# Patient Record
Sex: Female | Born: 1965 | Race: White | Hispanic: No | Marital: Married | Smoking: Current every day smoker
Health system: Southern US, Community
[De-identification: ages and names within clinical notes are randomized; demographics above are authoritative.]

## PROBLEM LIST (undated history)

## (undated) ENCOUNTER — Ambulatory Visit: Admission: EM | Discharge status: Against Medical Advice | Payer: PRIVATE HEALTH INSURANCE

## (undated) DIAGNOSIS — K859 Acute pancreatitis without necrosis or infection, unspecified: Secondary | ICD-10-CM

## (undated) DIAGNOSIS — E785 Hyperlipidemia, unspecified: Secondary | ICD-10-CM

## (undated) DIAGNOSIS — R12 Heartburn: Secondary | ICD-10-CM

## (undated) DIAGNOSIS — E119 Type 2 diabetes mellitus without complications: Secondary | ICD-10-CM

## (undated) DIAGNOSIS — I10 Essential (primary) hypertension: Secondary | ICD-10-CM

## (undated) HISTORY — DX: Heartburn: R12

## (undated) HISTORY — PX: APPENDECTOMY: SHX54

## (undated) HISTORY — PX: FOOT SURGERY: SHX648

## (undated) HISTORY — PX: ABDOMINAL HYSTERECTOMY: SHX81

## (undated) HISTORY — PX: CARDIAC CATHETERIZATION: SHX172

## (undated) HISTORY — PX: CHOLECYSTECTOMY: SHX55

## (undated) HISTORY — DX: Hyperlipidemia, unspecified: E78.5

---

## 1996-02-25 HISTORY — PX: CARPAL TUNNEL RELEASE: SHX101

## 2014-05-29 ENCOUNTER — Emergency Department
Admit: 2014-05-29 | Disposition: A | Discharge status: Home / Self Care | Payer: Self-pay | Admitting: Emergency Medicine

## 2014-05-29 LAB — CBC WITH DIFFERENTIAL/PLATELET
BASOS PCT: 0.3 %
Basophil #: 0 10*3/uL (ref 0.0–0.1)
Eosinophil #: 0.1 10*3/uL (ref 0.0–0.7)
Eosinophil %: 0.6 %
HCT: 48.1 % — AB (ref 35.0–47.0)
HGB: 16.3 g/dL — AB (ref 12.0–16.0)
LYMPHS PCT: 29.4 %
Lymphocyte #: 3 10*3/uL (ref 1.0–3.6)
MCH: 29.8 pg (ref 26.0–34.0)
MCHC: 33.8 g/dL (ref 32.0–36.0)
MCV: 88 fL (ref 80–100)
MONOS PCT: 5.4 %
Monocyte #: 0.5 x10 3/mm (ref 0.2–0.9)
NEUTROS ABS: 6.5 10*3/uL (ref 1.4–6.5)
Neutrophil %: 64.3 %
Platelet: 250 10*3/uL (ref 150–440)
RBC: 5.47 10*6/uL — ABNORMAL HIGH (ref 3.80–5.20)
RDW: 13.4 % (ref 11.5–14.5)
WBC: 10.2 10*3/uL (ref 3.6–11.0)

## 2014-05-29 LAB — COMPREHENSIVE METABOLIC PANEL
ALBUMIN: 4.4 g/dL
Alkaline Phosphatase: 84 U/L
Anion Gap: 9 (ref 7–16)
BUN: 12 mg/dL
Bilirubin,Total: 0.9 mg/dL
Calcium, Total: 9.2 mg/dL
Chloride: 99 mmol/L — ABNORMAL LOW
Co2: 23 mmol/L
Creatinine: 0.47 mg/dL
EGFR (African American): >60
EGFR (Non-African Amer.): >60
Glucose: 353 mg/dL — ABNORMAL HIGH
Potassium: 3.9 mmol/L
SGOT(AST): 18 U/L
SGPT (ALT): 18 U/L
SODIUM: 131 mmol/L — AB
TOTAL PROTEIN: 7.8 g/dL

## 2014-05-29 LAB — LIPASE, BLOOD: Lipase: 32 U/L

## 2014-06-23 ENCOUNTER — Emergency Department
Admit: 2014-06-23 | Disposition: A | Discharge status: Home / Self Care | Payer: Self-pay | Admitting: Emergency Medicine

## 2014-06-23 LAB — LIPASE, BLOOD: Lipase: 74 U/L — ABNORMAL HIGH

## 2014-06-23 LAB — CBC WITH DIFFERENTIAL/PLATELET
Basophil #: 0 10*3/uL (ref 0.0–0.1)
Basophil %: 0.4 %
EOS PCT: 1.3 %
Eosinophil #: 0.1 10*3/uL (ref 0.0–0.7)
HCT: 47 % (ref 35.0–47.0)
HGB: 16.3 g/dL — ABNORMAL HIGH (ref 12.0–16.0)
LYMPHS ABS: 4.5 10*3/uL — AB (ref 1.0–3.6)
Lymphocyte %: 40.3 %
MCH: 30.6 pg (ref 26.0–34.0)
MCHC: 34.6 g/dL (ref 32.0–36.0)
MCV: 89 fL (ref 80–100)
Monocyte #: 0.6 x10 3/mm (ref 0.2–0.9)
Monocyte %: 5.8 %
NEUTROS PCT: 52.2 %
Neutrophil #: 5.8 10*3/uL (ref 1.4–6.5)
PLATELETS: 239 10*3/uL (ref 150–440)
RBC: 5.31 10*6/uL — AB (ref 3.80–5.20)
RDW: 12.9 % (ref 11.5–14.5)
WBC: 11 10*3/uL (ref 3.6–11.0)

## 2014-06-23 LAB — COMPREHENSIVE METABOLIC PANEL
ALK PHOS: 81 U/L
Albumin: 4.1 g/dL
Anion Gap: 9 (ref 7–16)
BUN: 14 mg/dL
Bilirubin,Total: 0.6 mg/dL
CALCIUM: 8.8 mg/dL — AB
CHLORIDE: 98 mmol/L — AB
CREATININE: 0.6 mg/dL
Co2: 24 mmol/L
Glucose: 413 mg/dL — ABNORMAL HIGH
POTASSIUM: 4.3 mmol/L
SGOT(AST): 15 U/L
SGPT (ALT): 17 U/L
Sodium: 131 mmol/L — ABNORMAL LOW
Total Protein: 7.5 g/dL

## 2014-06-23 LAB — TROPONIN I

## 2014-06-26 ENCOUNTER — Emergency Department: Admission: EM | Admit: 2014-06-26 | Discharge: 2014-06-26 | Discharge status: Absent without leave | Payer: Self-pay

## 2014-09-19 ENCOUNTER — Encounter: Payer: Self-pay | Admitting: Student

## 2014-09-19 ENCOUNTER — Emergency Department: Payer: PRIVATE HEALTH INSURANCE

## 2014-09-19 ENCOUNTER — Emergency Department
Admission: EM | Admit: 2014-09-19 | Discharge: 2014-09-19 | Disposition: A | Discharge status: Home / Self Care | Payer: PRIVATE HEALTH INSURANCE | Attending: Emergency Medicine | Admitting: Emergency Medicine

## 2014-09-19 ENCOUNTER — Other Ambulatory Visit: Payer: Self-pay

## 2014-09-19 DIAGNOSIS — E1043 Type 1 diabetes mellitus with diabetic autonomic (poly)neuropathy: Secondary | ICD-10-CM | POA: Diagnosis not present

## 2014-09-19 DIAGNOSIS — I10 Essential (primary) hypertension: Secondary | ICD-10-CM | POA: Diagnosis not present

## 2014-09-19 DIAGNOSIS — Z91040 Latex allergy status: Secondary | ICD-10-CM | POA: Diagnosis not present

## 2014-09-19 DIAGNOSIS — K3184 Gastroparesis: Secondary | ICD-10-CM

## 2014-09-19 DIAGNOSIS — R079 Chest pain, unspecified: Secondary | ICD-10-CM | POA: Diagnosis present

## 2014-09-19 DIAGNOSIS — Z720 Tobacco use: Secondary | ICD-10-CM | POA: Diagnosis not present

## 2014-09-19 DIAGNOSIS — E1143 Type 2 diabetes mellitus with diabetic autonomic (poly)neuropathy: Secondary | ICD-10-CM

## 2014-09-19 DIAGNOSIS — R109 Unspecified abdominal pain: Secondary | ICD-10-CM

## 2014-09-19 HISTORY — DX: Acute pancreatitis without necrosis or infection, unspecified: K85.90

## 2014-09-19 HISTORY — DX: Type 2 diabetes mellitus without complications: E11.9

## 2014-09-19 HISTORY — DX: Essential (primary) hypertension: I10

## 2014-09-19 LAB — CBC
HEMATOCRIT: 45.4 % (ref 35.0–47.0)
Hemoglobin: 15.8 g/dL (ref 12.0–16.0)
MCH: 30.8 pg (ref 26.0–34.0)
MCHC: 34.9 g/dL (ref 32.0–36.0)
MCV: 88.2 fL (ref 80.0–100.0)
Platelets: 243 10*3/uL (ref 150–440)
RBC: 5.15 MIL/uL (ref 3.80–5.20)
RDW: 13.2 % (ref 11.5–14.5)
WBC: 9 10*3/uL (ref 3.6–11.0)

## 2014-09-19 LAB — URINALYSIS COMPLETE WITH MICROSCOPIC (ARMC ONLY)
BACTERIA UA: NONE SEEN
Bilirubin Urine: NEGATIVE
Ketones, ur: NEGATIVE mg/dL
Leukocytes, UA: NEGATIVE
Nitrite: NEGATIVE
Protein, ur: NEGATIVE mg/dL
Specific Gravity, Urine: 1.023 (ref 1.005–1.030)
Squamous Epithelial / LPF: NONE SEEN
pH: 7 (ref 5.0–8.0)

## 2014-09-19 LAB — COMPREHENSIVE METABOLIC PANEL
ALBUMIN: 4.1 g/dL (ref 3.5–5.0)
ALT: 13 U/L — AB (ref 14–54)
AST: 18 U/L (ref 15–41)
Alkaline Phosphatase: 93 U/L (ref 38–126)
Anion gap: 10 (ref 5–15)
BUN: 12 mg/dL (ref 6–20)
CO2: 23 mmol/L (ref 22–32)
CREATININE: 0.6 mg/dL (ref 0.44–1.00)
Calcium: 8.8 mg/dL — ABNORMAL LOW (ref 8.9–10.3)
Chloride: 96 mmol/L — ABNORMAL LOW (ref 101–111)
Glucose, Bld: 389 mg/dL — ABNORMAL HIGH (ref 65–99)
Potassium: 4 mmol/L (ref 3.5–5.1)
Sodium: 129 mmol/L — ABNORMAL LOW (ref 135–145)
Total Bilirubin: 0.5 mg/dL (ref 0.3–1.2)
Total Protein: 7.3 g/dL (ref 6.5–8.1)

## 2014-09-19 LAB — LIPASE, BLOOD: LIPASE: 23 U/L (ref 22–51)

## 2014-09-19 LAB — GLUCOSE, CAPILLARY: Glucose-Capillary: 312 mg/dL — ABNORMAL HIGH (ref 65–99)

## 2014-09-19 MED ORDER — METOCLOPRAMIDE HCL 5 MG/ML IJ SOLN
INTRAMUSCULAR | Status: AC
  Administered 2014-09-19: 10 mg via INTRAVENOUS
  Filled 2014-09-19: qty 2

## 2014-09-19 MED ORDER — METOCLOPRAMIDE HCL 5 MG/ML IJ SOLN
10.0000 mg | Freq: Once | INTRAMUSCULAR | Status: AC
  Administered 2014-09-19: 10 mg via INTRAVENOUS

## 2014-09-19 MED ORDER — OXYCODONE-ACETAMINOPHEN 5-325 MG PO TABS
1.0000 | ORAL_TABLET | Freq: Four times a day (QID) | ORAL | Status: DC | PRN
Start: 2014-09-19 — End: 2014-10-20

## 2014-09-19 MED ORDER — HYDROMORPHONE HCL 1 MG/ML IJ SOLN
0.5000 mg | Freq: Once | INTRAMUSCULAR | Status: AC
Start: 2014-09-19 — End: 2014-09-19
  Administered 2014-09-19: 0.5 mg via INTRAVENOUS
  Filled 2014-09-19: qty 1

## 2014-09-19 MED ORDER — INSULIN ASPART 100 UNIT/ML ~~LOC~~ SOLN
5.0000 [IU] | Freq: Once | SUBCUTANEOUS | Status: AC
  Administered 2014-09-19: 5 [IU] via SUBCUTANEOUS

## 2014-09-19 MED ORDER — SODIUM CHLORIDE 0.9 % IV SOLN
Freq: Once | INTRAVENOUS | Status: AC
  Administered 2014-09-19: 20:00:00 via INTRAVENOUS

## 2014-09-19 MED ORDER — PROMETHAZINE HCL 25 MG/ML IJ SOLN
INTRAMUSCULAR | Status: AC
  Administered 2014-09-19: 25 mg via INTRAVENOUS
  Filled 2014-09-19: qty 1

## 2014-09-19 MED ORDER — INSULIN ASPART 100 UNIT/ML ~~LOC~~ SOLN
SUBCUTANEOUS | Status: AC
  Administered 2014-09-19: 5 [IU] via SUBCUTANEOUS
  Filled 2014-09-19: qty 5

## 2014-09-19 MED ORDER — METOCLOPRAMIDE HCL 10 MG PO TABS: 10.0000 mg | ORAL_TABLET | Freq: Three times a day (TID) | ORAL | Status: DC | PRN

## 2014-09-19 MED ORDER — HYDROMORPHONE HCL 1 MG/ML IJ SOLN
INTRAMUSCULAR | Status: AC
  Administered 2014-09-19: 1 mg via INTRAVENOUS
  Filled 2014-09-19: qty 1

## 2014-09-19 MED ORDER — PROMETHAZINE HCL 25 MG/ML IJ SOLN
25.0000 mg | Freq: Once | INTRAMUSCULAR | Status: AC
  Administered 2014-09-19: 25 mg via INTRAVENOUS

## 2014-09-19 MED ORDER — HYDROMORPHONE HCL 1 MG/ML IJ SOLN
1.0000 mg | Freq: Once | INTRAMUSCULAR | Status: AC
  Administered 2014-09-19: 1 mg via INTRAVENOUS

## 2014-09-19 NOTE — ED Provider Notes (Signed)
[facility] Emergency Department Provider Note     Time seen: ----------------------------------------- 7:41 PM on 09/19/2014 -----------------------------------------    I have reviewed the triage vital signs and the nursing notes.   HISTORY  Chief Complaint Chest Pain and Nausea    HPI Veronica Lozano is a 49 y.o. female who presents to ER with feeling bad for the last 3 or 4 days. Patient states when this happened before but has been pancreatitis. She started having pain Saturday night, red meat seems to trigger it. Patient reports nausea vomiting and inability to keep anything down since that period time. Pain is in the epigastrium, severe. Nothing makes it better or worse.   Past Medical History  Diagnosis Date  . Diabetes mellitus without complication   . Pancreatitis   . Hypertension     There are no active problems to display for this patient.   Past Surgical History  Procedure Laterality Date  . Cholecystectomy    . Appendectomy    . Abdominal hysterectomy    . Cardiac catheterization      Allergies Latex and Morphine and related  Social History History  Substance Use Topics  . Smoking status: Current Every Day Smoker -- 0.50 packs/day    Types: Cigarettes  . Smokeless tobacco: Never Used  . Alcohol Use: No    Review of Systems Constitutional: Negative for fever. Eyes: Negative for visual changes. ENT: Negative for sore throat. Cardiovascular: Negative for chest pain. Respiratory: Negative for shortness of breath. Gastrointestinal: Positive for abdominal pain and vomiting Genitourinary: Negative for dysuria. Musculoskeletal: Negative for back pain. Skin: Negative for rash. Neurological: Negative for headaches, focal weakness or numbness.  10-point ROS otherwise negative.  ____________________________________________   PHYSICAL EXAM:  VITAL SIGNS: ED Triage Vitals  Enc Vitals Group     BP 09/19/14 1829 143/81  mmHg     Pulse Rate 09/19/14 1829 117     Resp 09/19/14 1829 18     Temp 09/19/14 1829 98 F (36.7 C)     Temp Source 09/19/14 1829 Oral     SpO2 09/19/14 1829 97 %     Weight 09/19/14 1829 146 lb (66.225 kg)     Height 09/19/14 1829 4\' 11"  (1.499 m)     Head Cir --      Peak Flow --      Pain Score 09/19/14 1829 9     Pain Loc --      Pain Edu? --      Excl. in GC? --     Constitutional: Alert and oriented. Mild distress Eyes: Conjunctivae are normal. PERRL. Normal extraocular movements. ENT   Head: Normocephalic and atraumatic.   Nose: No congestion/rhinnorhea.   Mouth/Throat: Mucous membranes are moist.   Neck: No stridor. Cardiovascular: Normal rate, regular rhythm. Normal and symmetric distal pulses are present in all extremities. No murmurs, rubs, or gallops. Respiratory: Normal respiratory effort without tachypnea nor retractions. Breath sounds are clear and equal bilaterally. No wheezes/rales/rhonchi. Gastrointestinal: Tender, distended, epigastric tenderness. No rebound or guarding. Musculoskeletal: Nontender with normal range of motion in all extremities. No joint effusions.  No lower extremity tenderness nor edema. Neurologic:  Normal speech and language. No gross focal neurologic deficits are appreciated. Speech is normal. No gait instability. Skin:  Skin is warm, dry and intact. No rash noted. Psychiatric: Mood and affect are normal. Speech and behavior are normal. Patient exhibits appropriate insight and judgment. ____________________________________________  EKG: Interpreted by me. Sinus tachycardia with  a rate of 109 bpm, normal axis normal intervals. No evidence of hypertrophy or acute infarction.  ____________________________________________  ED COURSE:  Pertinent labs & imaging results that were available during my care of the patient were reviewed by me and considered in my medical decision making (see chart for details). Patient need abdominal  labs, IV fluid antiemetics and mild pain control. ____________________________________________    LABS (pertinent positives/negatives)  Labs Reviewed  COMPREHENSIVE METABOLIC PANEL - Abnormal; Notable for the following:    Sodium 129 (*)    Chloride 96 (*)    Glucose, Bld 389 (*)    Calcium 8.8 (*)    ALT 13 (*)    All other components within normal limits  URINALYSIS COMPLETEWITH MICROSCOPIC ([facility] ONLY) - Abnormal; Notable for the following:    Color, Urine STRAW (*)    APPearance CLEAR (*)    Glucose, UA >500 (*)    Hgb urine dipstick 1+ (*)    All other components within normal limits  LIPASE, BLOOD  CBC   FINDINGS: The bowel gas pattern is normal. There is no evidence of free air. No radio-opaque calculi or other significant radiographic abnormality is seen. Cholecystectomy clips are noted.  IMPRESSION: Negative exam.  ____________________________________________  FINAL ASSESSMENT AND PLAN  Abdominal pain and vomiting, likely gastroparesis  Plan: Patient with labs and imaging as dictated above. Patient reportedly with a history of chronic pancreatitis. This is likely similar. She was given fluid, antiemetics and some pain medicine IV. She needed repeated doses of Reglan and IV Phenergan.  I offered the patient patient, she declined. We'll discharge her with Reglan and pain medication. She does have follow-up scheduled as an outpatient.   Emily Filbert, MD   Emily Filbert, MD 09/19/14 [phone]

## 2014-09-19 NOTE — ED Notes (Signed)
Felt bad for last three or four days.  "Pancreas pain" started late Saturday night.  Pt reports a history of pancreatitis.  Pt reports chest pain triggers left leg to hurt.  Pt reports nausea and vomiting since last night.

## 2014-09-19 NOTE — Discharge Instructions (Signed)

## 2014-10-10 ENCOUNTER — Emergency Department: Payer: PRIVATE HEALTH INSURANCE

## 2014-10-10 ENCOUNTER — Other Ambulatory Visit: Payer: Self-pay

## 2014-10-10 ENCOUNTER — Emergency Department
Admission: EM | Admit: 2014-10-10 | Discharge: 2014-10-10 | Disposition: A | Discharge status: Home / Self Care | Payer: PRIVATE HEALTH INSURANCE | Attending: Emergency Medicine | Admitting: Emergency Medicine

## 2014-10-10 ENCOUNTER — Emergency Department
Admission: EM | Admit: 2014-10-10 | Discharge: 2014-10-10 | Discharge status: Against Medical Advice | Payer: PRIVATE HEALTH INSURANCE | Attending: Emergency Medicine | Admitting: Emergency Medicine

## 2014-10-10 DIAGNOSIS — R079 Chest pain, unspecified: Secondary | ICD-10-CM | POA: Diagnosis not present

## 2014-10-10 DIAGNOSIS — E1165 Type 2 diabetes mellitus with hyperglycemia: Secondary | ICD-10-CM | POA: Diagnosis present

## 2014-10-10 DIAGNOSIS — I10 Essential (primary) hypertension: Secondary | ICD-10-CM | POA: Insufficient documentation

## 2014-10-10 DIAGNOSIS — E119 Type 2 diabetes mellitus without complications: Secondary | ICD-10-CM | POA: Diagnosis not present

## 2014-10-10 DIAGNOSIS — R739 Hyperglycemia, unspecified: Secondary | ICD-10-CM

## 2014-10-10 DIAGNOSIS — R0602 Shortness of breath: Secondary | ICD-10-CM | POA: Diagnosis not present

## 2014-10-10 DIAGNOSIS — Z9889 Other specified postprocedural states: Secondary | ICD-10-CM | POA: Insufficient documentation

## 2014-10-10 DIAGNOSIS — R110 Nausea: Secondary | ICD-10-CM | POA: Diagnosis not present

## 2014-10-10 DIAGNOSIS — Z720 Tobacco use: Secondary | ICD-10-CM | POA: Diagnosis not present

## 2014-10-10 DIAGNOSIS — Z91040 Latex allergy status: Secondary | ICD-10-CM | POA: Diagnosis not present

## 2014-10-10 DIAGNOSIS — R05 Cough: Secondary | ICD-10-CM | POA: Insufficient documentation

## 2014-10-10 DIAGNOSIS — Z794 Long term (current) use of insulin: Secondary | ICD-10-CM | POA: Diagnosis not present

## 2014-10-10 LAB — CBC
HEMATOCRIT: 45.5 % (ref 35.0–47.0)
HEMOGLOBIN: 15.8 g/dL (ref 12.0–16.0)
MCH: 30.5 pg (ref 26.0–34.0)
MCHC: 34.8 g/dL (ref 32.0–36.0)
MCV: 87.9 fL (ref 80.0–100.0)
Platelets: 223 10*3/uL (ref 150–440)
RBC: 5.18 MIL/uL (ref 3.80–5.20)
RDW: 12.8 % (ref 11.5–14.5)
WBC: 8.2 10*3/uL (ref 3.6–11.0)

## 2014-10-10 LAB — BASIC METABOLIC PANEL
ANION GAP: 9 (ref 5–15)
ANION GAP: 13 (ref 5–15)
BUN: 14 mg/dL (ref 6–20)
BUN: 13 mg/dL (ref 6–20)
CALCIUM: 9 mg/dL (ref 8.9–10.3)
CHLORIDE: 96 mmol/L — AB (ref 101–111)
CO2: 28 mmol/L (ref 22–32)
CO2: 22 mmol/L (ref 22–32)
Calcium: 9.1 mg/dL (ref 8.9–10.3)
Chloride: 95 mmol/L — ABNORMAL LOW (ref 101–111)
Creatinine, Ser: 0.56 mg/dL (ref 0.44–1.00)
Creatinine, Ser: 0.6 mg/dL (ref 0.44–1.00)
GFR calc Af Amer: >60 mL/min (ref >60)
GLUCOSE: 520 mg/dL — AB (ref 65–99)
GLUCOSE: 558 mg/dL — AB (ref 65–99)
POTASSIUM: 4.4 mmol/L (ref 3.5–5.1)
POTASSIUM: 3.6 mmol/L (ref 3.5–5.1)
SODIUM: 133 mmol/L — AB (ref 135–145)
Sodium: 130 mmol/L — ABNORMAL LOW (ref 135–145)

## 2014-10-10 LAB — TROPONIN I: Troponin I: <0.03 ng/mL (ref <0.031)

## 2014-10-10 LAB — LIPASE, BLOOD: LIPASE: 47 U/L (ref 22–51)

## 2014-10-10 LAB — GLUCOSE, CAPILLARY: GLUCOSE-CAPILLARY: 386 mg/dL — AB (ref 65–99)

## 2014-10-10 MED ORDER — SODIUM CHLORIDE 0.9 % IV BOLUS (SEPSIS)
1000.0000 mL | INTRAVENOUS | Status: AC
  Administered 2014-10-10: 1000 mL via INTRAVENOUS

## 2014-10-10 MED ORDER — INSULIN ASPART 100 UNIT/ML ~~LOC~~ SOLN
5.0000 [IU] | Freq: Once | SUBCUTANEOUS | Status: AC
  Administered 2014-10-10: 5 [IU] via SUBCUTANEOUS
  Filled 2014-10-10: qty 5

## 2014-10-10 MED ORDER — HYDROMORPHONE HCL 1 MG/ML IJ SOLN
1.0000 mg | Freq: Once | INTRAMUSCULAR | Status: AC
  Administered 2014-10-10: 1 mg via INTRAVENOUS

## 2014-10-10 MED ORDER — HYDROMORPHONE HCL 1 MG/ML IJ SOLN
INTRAMUSCULAR | Status: AC
  Administered 2014-10-10: 1 mg via INTRAVENOUS
  Filled 2014-10-10: qty 1

## 2014-10-10 NOTE — ED Notes (Signed)
Pt to desk states she is going to leave and go home to take a nitro, advised pt to stay but she states she can't wait any longer.

## 2014-10-10 NOTE — ED Notes (Signed)
Pt returned to ED after being called by nurse at home and informing her that her BS was in the "500's", pt states when she went home she took 21 units of Lantus insulin, states she still has cp and pain all over

## 2014-10-10 NOTE — ED Provider Notes (Signed)
[facility] Emergency Department Provider Note     Time seen: ----------------------------------------- 5:30 PM on 10/10/2014 -----------------------------------------    I have reviewed the triage vital signs and the nursing notes.   HISTORY  Chief Complaint Hyperglycemia    HPI Veronica Lozano is a 49 y.o. female who presents ER for high blood sugar and persistent upper abdominal and lower chest pain. Patient was seen by me several weeks ago for similar, was vomiting and had headache at that time. Patient went home and Reglan at that time, states the nausea improved as well as the headache still has the chest and back pain. Patient states she took her night dose of Lantus when she found that her blood sugar was elevated.   Past Medical History  Diagnosis Date  . Diabetes mellitus without complication   . Pancreatitis   . Hypertension     There are no active problems to display for this patient.   Past Surgical History  Procedure Laterality Date  . Cholecystectomy    . Appendectomy    . Abdominal hysterectomy    . Cardiac catheterization    . Foot surgery Bilateral     for neuropathy    Allergies Coconut oil; Latex; and Morphine and related  Social History Social History  Substance Use Topics  . Smoking status: Current Every Day Smoker -- 0.50 packs/day    Types: Cigarettes  . Smokeless tobacco: Never Used  . Alcohol Use: No    Review of Systems Constitutional: Negative for fever. Eyes: Negative for visual changes. ENT: Negative for sore throat. Cardiovascular: Positive for chest pain Respiratory: Negative for shortness of breath. Gastrointestinal: Negative for abdominal pain, vomiting and diarrhea. Genitourinary: Negative for dysuria. Musculoskeletal: Negative for back pain. Skin: Negative for rash. Neurological: Negative for headaches, focal weakness or numbness.  10-point ROS otherwise  negative.  ____________________________________________   PHYSICAL EXAM:  VITAL SIGNS: ED Triage Vitals  Enc Vitals Group     BP 10/10/14 1539 177/114 mmHg     Pulse Rate 10/10/14 1539 117     Resp --      Temp 10/10/14 1539 97.9 F (36.6 C)     Temp Source 10/10/14 1539 Oral     SpO2 10/10/14 1539 100 %     Weight 10/10/14 1539 144 lb (65.318 kg)     Height 10/10/14 1539  (1.499 m)     Head Cir --      Peak Flow --      Pain Score 10/10/14 1531 8     Pain Loc --      Pain Edu? --      Excl. in GC? --     Constitutional: Alert and oriented. Well appearing and in no distress. Eyes: Conjunctivae are normal. PERRL. Normal extraocular movements. ENT   Head: Normocephalic and atraumatic.   Nose: No congestion/rhinnorhea.   Mouth/Throat: Mucous membranes are moist.   Neck: No stridor. Cardiovascular: Normal rate, regular rhythm. Normal and symmetric distal pulses are present in all extremities. No murmurs, rubs, or gallops. Respiratory: Normal respiratory effort without tachypnea nor retractions. Breath sounds are clear and equal bilaterally. No wheezes/rales/rhonchi. Gastrointestinal: Soft and nontender. No distention. No abdominal bruits.  Musculoskeletal: Nontender with normal range of motion in all extremities. No joint effusions.  No lower extremity tenderness nor edema. Neurologic:  Normal speech and language. No gross focal neurologic deficits are appreciated. Speech is normal. No gait instability. Skin:  Skin is warm, dry and intact.  No rash noted. Psychiatric: Mood and affect are normal. Speech and behavior are normal. Patient exhibits appropriate insight and judgment. ____________________________________________  EKG: Interpreted by me. Sinus tachycardia with rate of 60 bpm, normal PR interval, normal QRS with, normal QT interval. No evidence of hypertrophy or acute infarction.  ____________________________________________  ED COURSE:  Pertinent  labs & imaging results that were available during my care of the patient were reviewed by me and considered in my medical decision making (see chart for details). Patient receive a liter of saline, will recheck her blood sugar. ____________________________________________    LABS (pertinent positives/negatives)  Labs Reviewed  BASIC METABOLIC PANEL - Abnormal; Notable for the following:    Sodium 130 (*)    Chloride 95 (*)    Glucose, Bld 558 (*)    All other components within normal limits  GLUCOSE, CAPILLARY - Abnormal; Notable for the following:    Glucose-Capillary 386 (*)    All other components within normal limits  TROPONIN I   ____________________________________________  FINAL ASSESSMENT AND PLAN  Chest pain, hyperglycemia  Plan: Patient with labs and imaging as dictated above. Patient will need an adjustment in both her short and long-term insulin. She is stable for outpatient follow-up.   Emily Filbert, MD   Emily Filbert, MD 10/10/14 [phone]

## 2014-10-10 NOTE — ED Notes (Signed)
Pt reports being on other medication (protonix, omeprozole, shingles medication that she is unaware the name of,etc.) but recently moved and had a laps in insurance and has not been taking medication other than insulins and pancreas medication for 3 months. Pt has MD appointment on August 31st to reestablish medications.

## 2014-10-10 NOTE — ED Notes (Signed)
Called pt after request from charge nurse.  Advised pt of abnormal labs, CBG 520.  States she will call back and speak with charge nurse.

## 2014-10-10 NOTE — Discharge Instructions (Signed)
High Blood Sugar °High blood sugar (hyperglycemia) means that the level of sugar in your blood is higher than it should be. Signs of high blood sugar include: °· Feeling thirsty. °· Frequent peeing (urinating). °· Feeling tired or sleepy. °· Dry mouth. °· Vision changes. °· Feeling weak. °· Feeling hungry but losing weight. °· Numbness and tingling in your hands or feet. °· Headache. °When you ignore these signs, your blood sugar may keep going up. These problems may get worse, and other problems may begin. °HOME CARE °· Check your blood sugars as told by your doctor. Write down the numbers with the date and time. °· Take the right amount of insulin or diabetes pills at the right time. Write down the dose with date and time. °· Refill your insulin or diabetes pills before running out. °· Watch what you eat. Follow your meal plan. °· Drink liquids without sugar, such as water. Check with your doctor if you have kidney or heart disease. °· Follow your doctor's orders for exercise. Exercise at the same time of day. °· Keep your doctor's appointments. °GET HELP RIGHT AWAY IF:  °· You have trouble thinking or are confused. °· You have fast breathing with fruity smelling breath. °· You pass out (faint). °· You have 2 to 3 days of high blood sugars and you do not know why. °· You have chest pain. °· You are feeling sick to your stomach (nauseous) or throwing up (vomiting). °· You have sudden vision changes. °MAKE SURE YOU:  °· Understand these instructions. °· Will watch your condition. °· Will get help right away if you are not doing well or get worse. °Document Released: 12/08/2008 Document Revised: 05/05/2011 Document Reviewed: 12/08/2008 °[employer]® Patient Information ©2015 [employer]. This information is not intended to replace advice given to you by your health care provider. Make sure you discuss any questions you have with your health care provider. ° °

## 2014-10-10 NOTE — ED Notes (Signed)
Pt c/o substernal chest pain that radiates into the back with Nausea for the past 2 days with increased SOB, also c/o cough with congestion

## 2014-10-10 NOTE — ED Notes (Addendum)
Pt reports EKG done at 1300 today as well as xrays before pt left ED. MD notified.

## 2014-10-17 ENCOUNTER — Emergency Department
Admission: EM | Admit: 2014-10-17 | Discharge: 2014-10-18 | Disposition: A | Discharge status: Home / Self Care | Payer: PRIVATE HEALTH INSURANCE | Attending: Emergency Medicine | Admitting: Emergency Medicine

## 2014-10-17 ENCOUNTER — Other Ambulatory Visit: Payer: Self-pay

## 2014-10-17 ENCOUNTER — Emergency Department: Payer: PRIVATE HEALTH INSURANCE

## 2014-10-17 ENCOUNTER — Encounter: Payer: Self-pay | Admitting: Urgent Care

## 2014-10-17 DIAGNOSIS — K859 Acute pancreatitis, unspecified: Secondary | ICD-10-CM | POA: Insufficient documentation

## 2014-10-17 DIAGNOSIS — Z720 Tobacco use: Secondary | ICD-10-CM | POA: Diagnosis not present

## 2014-10-17 DIAGNOSIS — R000 Tachycardia, unspecified: Secondary | ICD-10-CM | POA: Diagnosis not present

## 2014-10-17 DIAGNOSIS — R0602 Shortness of breath: Secondary | ICD-10-CM | POA: Diagnosis not present

## 2014-10-17 DIAGNOSIS — Z91040 Latex allergy status: Secondary | ICD-10-CM | POA: Insufficient documentation

## 2014-10-17 DIAGNOSIS — M545 Low back pain: Secondary | ICD-10-CM | POA: Diagnosis not present

## 2014-10-17 DIAGNOSIS — E1165 Type 2 diabetes mellitus with hyperglycemia: Secondary | ICD-10-CM | POA: Insufficient documentation

## 2014-10-17 DIAGNOSIS — Z79899 Other long term (current) drug therapy: Secondary | ICD-10-CM | POA: Diagnosis not present

## 2014-10-17 DIAGNOSIS — R739 Hyperglycemia, unspecified: Secondary | ICD-10-CM

## 2014-10-17 DIAGNOSIS — Z794 Long term (current) use of insulin: Secondary | ICD-10-CM | POA: Diagnosis not present

## 2014-10-17 DIAGNOSIS — I10 Essential (primary) hypertension: Secondary | ICD-10-CM | POA: Diagnosis not present

## 2014-10-17 DIAGNOSIS — R079 Chest pain, unspecified: Secondary | ICD-10-CM | POA: Insufficient documentation

## 2014-10-17 DIAGNOSIS — R6883 Chills (without fever): Secondary | ICD-10-CM | POA: Insufficient documentation

## 2014-10-17 LAB — CBC WITH DIFFERENTIAL/PLATELET
Basophils Absolute: 0.1 10*3/uL (ref 0–0.1)
Basophils Relative: 1 %
EOS ABS: 0.1 10*3/uL (ref 0–0.7)
Eosinophils Relative: 1 %
HEMATOCRIT: 45.2 % (ref 35.0–47.0)
HEMOGLOBIN: 15.6 g/dL (ref 12.0–16.0)
LYMPHS ABS: 3.8 10*3/uL — AB (ref 1.0–3.6)
Lymphocytes Relative: 39 %
MCH: 30.5 pg (ref 26.0–34.0)
MCHC: 34.5 g/dL (ref 32.0–36.0)
MCV: 88.2 fL (ref 80.0–100.0)
MONOS PCT: 6 %
Monocytes Absolute: 0.6 10*3/uL (ref 0.2–0.9)
NEUTROS ABS: 5.2 10*3/uL (ref 1.4–6.5)
NEUTROS PCT: 53 %
Platelets: 210 10*3/uL (ref 150–440)
RBC: 5.13 MIL/uL (ref 3.80–5.20)
RDW: 12.7 % (ref 11.5–14.5)
WBC: 9.7 10*3/uL (ref 3.6–11.0)

## 2014-10-17 LAB — COMPREHENSIVE METABOLIC PANEL
ALT: 15 U/L (ref 14–54)
ANION GAP: 14 (ref 5–15)
AST: 28 U/L (ref 15–41)
Albumin: 3.9 g/dL (ref 3.5–5.0)
Alkaline Phosphatase: 90 U/L (ref 38–126)
BUN: 12 mg/dL (ref 6–20)
CHLORIDE: 95 mmol/L — AB (ref 101–111)
CO2: 23 mmol/L (ref 22–32)
CREATININE: 0.8 mg/dL (ref 0.44–1.00)
Calcium: 8.7 mg/dL — ABNORMAL LOW (ref 8.9–10.3)
Glucose, Bld: 435 mg/dL — ABNORMAL HIGH (ref 65–99)
POTASSIUM: 3.5 mmol/L (ref 3.5–5.1)
SODIUM: 132 mmol/L — AB (ref 135–145)
Total Bilirubin: 0.8 mg/dL (ref 0.3–1.2)
Total Protein: 7.2 g/dL (ref 6.5–8.1)

## 2014-10-17 LAB — TROPONIN I

## 2014-10-17 LAB — GLUCOSE, CAPILLARY: GLUCOSE-CAPILLARY: 510 mg/dL — AB (ref 65–99)

## 2014-10-17 LAB — LIPASE, BLOOD: LIPASE: 64 U/L — AB (ref 22–51)

## 2014-10-17 MED ORDER — ONDANSETRON HCL 4 MG/2ML IJ SOLN
4.0000 mg | Freq: Once | INTRAMUSCULAR | Status: AC
  Administered 2014-10-17: 4 mg via INTRAVENOUS
  Filled 2014-10-17: qty 2

## 2014-10-17 MED ORDER — INSULIN ASPART 100 UNIT/ML ~~LOC~~ SOLN
10.0000 [IU] | Freq: Once | SUBCUTANEOUS | Status: AC
  Administered 2014-10-17: 10 [IU] via SUBCUTANEOUS
  Filled 2014-10-17: qty 10

## 2014-10-17 MED ORDER — IOHEXOL 350 MG/ML SOLN
100.0000 mL | Freq: Once | INTRAVENOUS | Status: AC | PRN
  Administered 2014-10-17: 100 mL via INTRAVENOUS

## 2014-10-17 MED ORDER — SODIUM CHLORIDE 0.9 % IV BOLUS (SEPSIS)
1000.0000 mL | Freq: Once | INTRAVENOUS | Status: AC
  Administered 2014-10-17: 1000 mL via INTRAVENOUS

## 2014-10-17 MED ORDER — INSULIN GLARGINE 100 UNIT/ML ~~LOC~~ SOLN
25.0000 [IU] | Freq: Once | SUBCUTANEOUS | Status: AC
  Administered 2014-10-17: 25 [IU] via SUBCUTANEOUS
  Filled 2014-10-17: qty 0.25

## 2014-10-17 MED ORDER — HYDROMORPHONE HCL 1 MG/ML IJ SOLN
0.5000 mg | INTRAMUSCULAR | Status: AC | PRN
  Administered 2014-10-17 (×2): 0.5 mg via INTRAVENOUS
  Filled 2014-10-17 (×2): qty 1

## 2014-10-17 NOTE — ED Provider Notes (Signed)
[facility] Emergency Department Provider Note  ____________________________________________  Time seen: 2220  I have reviewed the triage vital signs and the nursing notes.   HISTORY  Chief Complaint Chest Pain and Hyperglycemia     HPI Veronica Lozano is a 49 y.o. female with diabetes who was in the emergency department 2 times prior with similar symptoms. She was last here on August 16. She had elevated sugar and chest pain at that time. She reports that these symptoms have continued since she was seen on the 16th. She has chest pain that goes through to the back. This pain is worse after she eats and after she said she often vomits. She also has epigastric pain.  She reports she has been taking her Lantus each evening and her regular insulin 3 times a day. Her last dose of regular insulin was at 4:30 PM today. Despite this her glucose on arrival is 510.  She denies any history of blood clots, however when I inquire about this and risk factors for pulmonary emboli, she reports she has had a pain in her left leg.  She is unable to tell me if she has had a fever or not. She says "she gets hot she is cold". No fever on arrival to the emergency department today.  She denies palpitations though she does have ongoing tachycardia upon arrival.   Past Medical History  Diagnosis Date  . Diabetes mellitus without complication   . Pancreatitis   . Hypertension     There are no active problems to display for this patient.   Past Surgical History  Procedure Laterality Date  . Cholecystectomy    . Appendectomy    . Abdominal hysterectomy    . Cardiac catheterization    . Foot surgery Bilateral     for neuropathy    Current Outpatient Rx  Name  Route  Sig  Dispense  Refill  . insulin aspart (NOVOLOG) 100 UNIT/ML injection   Subcutaneous   Inject 19 Units into the skin 3 (three) times daily before meals.          . insulin glargine (LANTUS) 100  UNIT/ML injection   Subcutaneous   Inject 25 Units into the skin at bedtime.          . metoCLOPramide (REGLAN) 10 MG tablet   Oral   Take 1 tablet (10 mg total) by mouth every 8 (eight) hours as needed for nausea or vomiting.   30 tablet   1   . oxyCODONE-acetaminophen (ROXICET) 5-325 MG per tablet   Oral   Take 1 tablet by mouth every 6 (six) hours as needed. Patient taking differently: Take 1 tablet by mouth every 6 (six) hours as needed for severe pain.    20 tablet   0   . Pancrelipase, Lip-Prot-Amyl, (CREON) 24000 UNITS CPEP   Oral   Take 24,000 Units by mouth 3 (three) times daily with meals.           Allergies Coconut oil; Latex; and Morphine and related  No family history on file.  Social History Social History  Substance Use Topics  . Smoking status: Current Every Day Smoker -- 0.50 packs/day    Types: Cigarettes  . Smokeless tobacco: Never Used  . Alcohol Use: No    Review of Systems  Constitutional: Occasional chills. ENT: Negative for sore throat. Cardiovascular: Positive for chest pain. Respiratory: Some shortness of breath. Gastrointestinal: Increased pain and vomiting with food. Genitourinary: Negative for  dysuria. Musculoskeletal: Diffuse pain and back including her lower back. Skin: Negative for rash. Neurological: Negative for headaches   10-point ROS otherwise negative.  ____________________________________________   PHYSICAL EXAM:  VITAL SIGNS: ED Triage Vitals  Enc Vitals Group     BP --      Pulse --      Resp --      Temp --      Temp src --      SpO2 --      Weight --      Height --      Head Cir --      Peak Flow --      Pain Score 10/17/14 1959 8     Pain Loc --      Pain Edu? --      Excl. in GC? --     Constitutional:  Alert and oriented. Patient has no acute distress but looks little bit uncomfortable.Marland Kitchen ENT   Head: Normocephalic and atraumatic.   Nose: No congestion/rhinnorhea.   Mouth/Throat:  Mucous membranes are moist. Cardiovascular: Tachycardic at a rate of 110, regular rhythm, no murmur noted Respiratory:  Normal respiratory effort, no tachypnea.    Breath sounds are clear and equal bilaterally.  Gastrointestinal: Soft. Tenderness in the epigastric area.. No distention.  Back: No muscle spasm. I'll to moderate point tenderness on palpation.. Musculoskeletal: No deformity noted. Nontender with normal range of motion in all extremities.  No noted edema. Neurologic:  Normal speech and language. No gross focal neurologic deficits are appreciated.  Skin:  Skin is warm, dry. No rash noted. Psychiatric: Mood and affect are normal. Speech and behavior are normal.  ____________________________________________    LABS (pertinent positives/negatives)  Labs Reviewed  GLUCOSE, CAPILLARY - Abnormal; Notable for the following:    Glucose-Capillary 510 (*)    All other components within normal limits  COMPREHENSIVE METABOLIC PANEL - Abnormal; Notable for the following:    Sodium 132 (*)    Chloride 95 (*)    Glucose, Bld 435 (*)    Calcium 8.7 (*)    All other components within normal limits  CBC WITH DIFFERENTIAL/PLATELET - Abnormal; Notable for the following:    Lymphs Abs 3.8 (*)    All other components within normal limits  LIPASE, BLOOD - Abnormal; Notable for the following:    Lipase 64 (*)    All other components within normal limits  TROPONIN I     ____________________________________________   EKG  ED ECG REPORT I, Rael Yo W, the attending physician, personally viewed and interpreted this ECG.   Date: 10/17/2014  EKG Time: 1958  Rate: 107  Rhythm: Sinus tachycardia  Axis: Normal  Intervals: Normal  ST&T Change: None noted  ____________________________________________    RADIOLOGY  CT chest: Pending  ____________________________________________  INITIAL IMPRESSION / ASSESSMENT AND PLAN / ED COURSE  Pertinent labs & imaging results that were  available during my care of the patient were reviewed by me and considered in my medical decision making (see chart for details).  49 year old female with diabetes with persistent hyperglycemia despite what she reports is compliant treatment. She has ongoing chest pain radiating to her back with nausea and increased pain when she needs. It is unclear if she may have some form of an infection that is driving her hyperglycemia, if she may have a blood clot with pleuritic pain, or if this is esophageal, worse when she eats.  The patient and I have discussed giving a CT  scan of her chest which was due to clarify some of the differential diagnosis quoted above.  We will treat her with IV fluids and both regular insulin and Lantus for her ongoing hyperglycemia.  ----------------------------------------- 10:07 PM on 10/17/2014 -----------------------------------------  The patient had some improvement with her pain after the first dose of pain medication. Her pain is now come back. We'll treat her with additional Dilaudid and Zofran.  I discussed case with Dr. Sharyn Creamer. He will see him care of the patient at this time in the emergency Department and follow-up on her CT scan and determine disposition for this patient.   ____________________________________________   FINAL CLINICAL IMPRESSION(S) / ED DIAGNOSES  Final diagnoses:  Hyperglycemia  Chest pain, unspecified chest pain type      Darien Ramus, MD 10/17/14 2208

## 2014-10-17 NOTE — ED Notes (Signed)
Patient presents with c/o retrosternal CP and elevated CBG since 8/16. Of note, patient was seen here on 8/16 for the same. (+) SOB, N/V, and back pain reported. Patient weak and fatigues easily. Has appt to establish care on 8/31; "Dr. Mayford Knife told me to see the diabetes doctor, but they wont see me without a referral from my doctor. I don't see that doctor until 8/31."

## 2014-10-17 NOTE — ED Notes (Signed)
Patient requesting pain medication and nausea medication. MD notified. See MAR.

## 2014-10-17 NOTE — ED Notes (Signed)
Patient presents to ED with c/o high blood sugar, N/V, fatigue, and shortness of breath as well as chest pian radiating to back. Patient reports was seen here on 8/16 for the same, was told to follow up with diabetic doctor but patient states she cannot see them without a referral from PCP. Reports she cannot see PCP until 8/31. Patient reports symptoms have been increasing since 8/16. Patient reports chest pain since 8/16 as well. Patient alert and oriented x 4, respirations even and unlabored, speaking in complete sentences. Patient placed on the monitor.

## 2014-10-17 NOTE — ED Notes (Signed)
[id number] 

## 2014-10-17 NOTE — ED Provider Notes (Signed)
-----------------------------------------   11:17 PM on 10/17/2014 -----------------------------------------  Ongoing care and disposition assigned to Dr. Manson Passey. Plan as described by Dr. Carollee Massed.  Sharyn Creamer, MD 10/17/14 [phone]

## 2014-10-17 NOTE — ED Notes (Signed)
Patient transported to CT 

## 2014-10-18 LAB — GLUCOSE, CAPILLARY: GLUCOSE-CAPILLARY: 303 mg/dL — AB (ref 65–99)

## 2014-10-18 MED ORDER — OXYCODONE-ACETAMINOPHEN 5-325 MG PO TABS: 1.0000 | ORAL_TABLET | Freq: Four times a day (QID) | ORAL | Status: DC | PRN

## 2014-10-18 MED ORDER — PANTOPRAZOLE SODIUM 40 MG PO TBEC
40.0000 mg | DELAYED_RELEASE_TABLET | Freq: Every day | ORAL | Status: DC
Start: 2014-10-18 — End: 2015-03-19

## 2014-10-18 NOTE — ED Provider Notes (Signed)
I assumed care of the patient 11:00 PM from Dr. Judd Gaudier. CT scan revealed CT Angio Chest PE W/Cm &/Or Wo Cm (Final result) Result time: 10/17/14 23:06:07   Final result by Rad Results In Interface (10/17/14 23:06:07)   Narrative:   CLINICAL DATA: Chest pain. Elevated blood sugar.  EXAM: CT ANGIOGRAPHY CHEST WITH CONTRAST  TECHNIQUE: Multidetector CT imaging of the chest was performed using the standard protocol during bolus administration of intravenous contrast. Multiplanar CT image reconstructions and MIPs were obtained to evaluate the vascular anatomy.  CONTRAST: OMNIPAQUE IOHEXOL 350 MG/ML SOLN  COMPARISON: None.  FINDINGS: Mediastinum: The heart size appears normal. The trachea appears patent and is midline. Normal appearance of the esophagus. Prominent mediastinal and hilar nodes identified without adenopathy. The main pulmonary artery is patent. No saddle embolus identified within the left or right pulmonary arteries. The lobar and segmental pulmonary arteries are patent bilaterally. No filling defects identified.  Lungs/Pleura: No pleural effusion identified. There is no airspace consolidation identified.  Upper Abdomen: The visualized liver and spleen appear normal. The adrenal glands are unremarkable. Focal area of relative hypo enhancement within the distal tail of pancreas measures 2.5 cm. This exhibits relative hypo enhancement compared with the proximal tail of pancreas with Hounsfield units equal to 55.6 versus 91.6. There is mild surrounding fat stranding  Musculoskeletal: There is mild degenerative disc disease within the thoracic spine.  Review of the MIP images confirms the above findings.  IMPRESSION: 1. There is no evidence for acute pulmonary embolus. 2. Within the tail of pancreas there is a focal area at the of relative hypo enhancement and surrounding fat stranding. If there are signs and symptoms of pancreatitis then this would be  suggestive of focal pancreatitis. Clinical correlation is recommended. If there is no evidence for pancreatitis then further imaging with pancreas protocol MRI is recommended to assess for underlying mass lesion.   Electronically Signed By: Signa Kell M.D. On: 10/17/2014 23:06      Following review of the CT scan I went and evaluated the patient. When I asked the patient this location of her pain she pointed to her left upper quadrant/epigastrium. I informed the patient and her CT scan had findings consistent with pancreatitis lipase elevated at 64. The patient states that she's had pancreatitis times in the past. Patient states that she has ran out of her medications and she suspects this to be the cause of her increasing pain. Medications including oxycodone omeprazole etc. patient states that she is new to the area and has an appointment with her PMD on the first of the month. I offered admission to the patient for ongoing pain however patient states that she did not want to be admitted that she would rather be discharged home with by mouth pain medication for home.  Darci Current, MD 10/18/14 [phone]

## 2014-10-18 NOTE — ED Notes (Signed)

## 2014-10-18 NOTE — Discharge Instructions (Signed)

## 2014-10-19 ENCOUNTER — Encounter: Payer: Self-pay | Admitting: Emergency Medicine

## 2014-10-19 ENCOUNTER — Observation Stay
Admission: EM | Admit: 2014-10-19 | Discharge: 2014-10-21 | Discharge status: Against Medical Advice | Payer: PRIVATE HEALTH INSURANCE | Attending: Internal Medicine | Admitting: Internal Medicine

## 2014-10-19 DIAGNOSIS — R112 Nausea with vomiting, unspecified: Secondary | ICD-10-CM | POA: Insufficient documentation

## 2014-10-19 DIAGNOSIS — Z800 Family history of malignant neoplasm of digestive organs: Secondary | ICD-10-CM | POA: Insufficient documentation

## 2014-10-19 DIAGNOSIS — Z885 Allergy status to narcotic agent status: Secondary | ICD-10-CM | POA: Diagnosis not present

## 2014-10-19 DIAGNOSIS — Z9889 Other specified postprocedural states: Secondary | ICD-10-CM | POA: Diagnosis not present

## 2014-10-19 DIAGNOSIS — Z807 Family history of other malignant neoplasms of lymphoid, hematopoietic and related tissues: Secondary | ICD-10-CM | POA: Insufficient documentation

## 2014-10-19 DIAGNOSIS — Z91040 Latex allergy status: Secondary | ICD-10-CM | POA: Diagnosis not present

## 2014-10-19 DIAGNOSIS — IMO0002 Reserved for concepts with insufficient information to code with codable children: Secondary | ICD-10-CM

## 2014-10-19 DIAGNOSIS — K859 Acute pancreatitis without necrosis or infection, unspecified: Secondary | ICD-10-CM

## 2014-10-19 DIAGNOSIS — Z9049 Acquired absence of other specified parts of digestive tract: Secondary | ICD-10-CM | POA: Insufficient documentation

## 2014-10-19 DIAGNOSIS — Z794 Long term (current) use of insulin: Secondary | ICD-10-CM | POA: Insufficient documentation

## 2014-10-19 DIAGNOSIS — Z91018 Allergy to other foods: Secondary | ICD-10-CM | POA: Insufficient documentation

## 2014-10-19 DIAGNOSIS — R1012 Left upper quadrant pain: Secondary | ICD-10-CM | POA: Insufficient documentation

## 2014-10-19 DIAGNOSIS — I10 Essential (primary) hypertension: Secondary | ICD-10-CM | POA: Insufficient documentation

## 2014-10-19 DIAGNOSIS — F17210 Nicotine dependence, cigarettes, uncomplicated: Secondary | ICD-10-CM | POA: Insufficient documentation

## 2014-10-19 DIAGNOSIS — K858 Other acute pancreatitis without necrosis or infection: Secondary | ICD-10-CM

## 2014-10-19 DIAGNOSIS — Z79899 Other long term (current) drug therapy: Secondary | ICD-10-CM | POA: Insufficient documentation

## 2014-10-19 DIAGNOSIS — E1165 Type 2 diabetes mellitus with hyperglycemia: Secondary | ICD-10-CM | POA: Insufficient documentation

## 2014-10-19 DIAGNOSIS — R109 Unspecified abdominal pain: Secondary | ICD-10-CM | POA: Diagnosis present

## 2014-10-19 LAB — CBC
HEMATOCRIT: 46.1 % (ref 35.0–47.0)
Hemoglobin: 15.8 g/dL (ref 12.0–16.0)
MCH: 30.1 pg (ref 26.0–34.0)
MCHC: 34.3 g/dL (ref 32.0–36.0)
MCV: 87.8 fL (ref 80.0–100.0)
PLATELETS: 224 10*3/uL (ref 150–440)
RBC: 5.25 MIL/uL — ABNORMAL HIGH (ref 3.80–5.20)
RDW: 13 % (ref 11.5–14.5)
WBC: 11.5 10*3/uL — AB (ref 3.6–11.0)

## 2014-10-19 LAB — COMPREHENSIVE METABOLIC PANEL
ALBUMIN: 4.2 g/dL (ref 3.5–5.0)
ALK PHOS: 76 U/L (ref 38–126)
ALT: 13 U/L — ABNORMAL LOW (ref 14–54)
AST: 21 U/L (ref 15–41)
Anion gap: 12 (ref 5–15)
BILIRUBIN TOTAL: 0.6 mg/dL (ref 0.3–1.2)
BUN: 12 mg/dL (ref 6–20)
CALCIUM: 9.4 mg/dL (ref 8.9–10.3)
CO2: 25 mmol/L (ref 22–32)
CREATININE: 0.54 mg/dL (ref 0.44–1.00)
Chloride: 98 mmol/L — ABNORMAL LOW (ref 101–111)
GFR calc Af Amer: >60 mL/min (ref >60)
GLUCOSE: 339 mg/dL — AB (ref 65–99)
POTASSIUM: 3.2 mmol/L — AB (ref 3.5–5.1)
Sodium: 135 mmol/L (ref 135–145)
TOTAL PROTEIN: 7.4 g/dL (ref 6.5–8.1)

## 2014-10-19 LAB — LIPASE, BLOOD: Lipase: 65 U/L — ABNORMAL HIGH (ref 22–51)

## 2014-10-19 MED ORDER — ONDANSETRON HCL 4 MG/2ML IJ SOLN
4.0000 mg | Freq: Once | INTRAMUSCULAR | Status: AC
  Administered 2014-10-19: 4 mg via INTRAVENOUS

## 2014-10-19 MED ORDER — ONDANSETRON HCL 4 MG/2ML IJ SOLN
4.0000 mg | Freq: Once | INTRAMUSCULAR | Status: AC
  Administered 2014-10-19: 4 mg via INTRAVENOUS
  Filled 2014-10-19: qty 2

## 2014-10-19 MED ORDER — FENTANYL CITRATE (PF) 100 MCG/2ML IJ SOLN
25.0000 ug | Freq: Once | INTRAMUSCULAR | Status: AC
  Administered 2014-10-19: 25 ug via INTRAVENOUS
  Filled 2014-10-19: qty 2

## 2014-10-19 MED ORDER — HYDROMORPHONE HCL 1 MG/ML IJ SOLN
INTRAMUSCULAR | Status: AC
  Administered 2014-10-19: 1 mg via INTRAVENOUS
  Filled 2014-10-19: qty 1

## 2014-10-19 MED ORDER — HYDROMORPHONE HCL 1 MG/ML IJ SOLN
1.0000 mg | Freq: Once | INTRAMUSCULAR | Status: AC
  Administered 2014-10-19: 1 mg via INTRAVENOUS

## 2014-10-19 MED ORDER — ONDANSETRON HCL 4 MG/2ML IJ SOLN
INTRAMUSCULAR | Status: AC
  Administered 2014-10-19: 4 mg via INTRAVENOUS
  Filled 2014-10-19: qty 2

## 2014-10-19 NOTE — ED Notes (Signed)
Pt to rm 25 via EMS from home.  EMS reports LUQ x 1 week, pt seen twice in ED for pain and dx with pancreatitis and sent home with oxycodone.  PT reports pain worsened tonight at 9pm.  EMS report hx htn and DM.  HR 114, BP 1777/100, o2sat 98% RA.  PT NAD at this time.

## 2014-10-20 ENCOUNTER — Encounter: Payer: Self-pay | Admitting: Emergency Medicine

## 2014-10-20 ENCOUNTER — Observation Stay: Payer: PRIVATE HEALTH INSURANCE

## 2014-10-20 DIAGNOSIS — R109 Unspecified abdominal pain: Secondary | ICD-10-CM | POA: Diagnosis present

## 2014-10-20 LAB — URINALYSIS COMPLETE WITH MICROSCOPIC (ARMC ONLY)
BACTERIA UA: NONE SEEN
BILIRUBIN URINE: NEGATIVE
KETONES UR: NEGATIVE mg/dL
LEUKOCYTES UA: NEGATIVE
NITRITE: NEGATIVE
Protein, ur: NEGATIVE mg/dL
SPECIFIC GRAVITY, URINE: 1.024 (ref 1.005–1.030)
pH: 6 (ref 5.0–8.0)

## 2014-10-20 LAB — GLUCOSE, CAPILLARY
Glucose-Capillary: 247 mg/dL — ABNORMAL HIGH (ref 65–99)
Glucose-Capillary: 134 mg/dL — ABNORMAL HIGH (ref 65–99)
Glucose-Capillary: 164 mg/dL — ABNORMAL HIGH (ref 65–99)
Glucose-Capillary: 185 mg/dL — ABNORMAL HIGH (ref 65–99)

## 2014-10-20 LAB — HEMOGLOBIN A1C: Hgb A1c MFr Bld: 12.5 % — ABNORMAL HIGH (ref 4.0–6.0)

## 2014-10-20 MED ORDER — PANCRELIPASE (LIP-PROT-AMYL) 12000-38000 UNITS PO CPEP
24000.0000 [IU] | ORAL_CAPSULE | Freq: Three times a day (TID) | ORAL | Status: DC
  Administered 2014-10-20 (×2): 24000 [IU] via ORAL
  Filled 2014-10-20 (×5): qty 2

## 2014-10-20 MED ORDER — ONDANSETRON HCL 4 MG/2ML IJ SOLN
4.0000 mg | Freq: Once | INTRAMUSCULAR | Status: AC
Start: 2014-10-20 — End: 2014-10-20
  Administered 2014-10-20: 4 mg via INTRAVENOUS
  Filled 2014-10-20: qty 2

## 2014-10-20 MED ORDER — METOCLOPRAMIDE HCL 10 MG PO TABS
10.0000 mg | ORAL_TABLET | Freq: Three times a day (TID) | ORAL | Status: DC | PRN
  Administered 2014-10-20 – 2014-10-21 (×3): 10 mg via ORAL
  Filled 2014-10-20 (×4): qty 1

## 2014-10-20 MED ORDER — POTASSIUM CHLORIDE IN NACL 40-0.9 MEQ/L-% IV SOLN
INTRAVENOUS | Status: DC
  Administered 2014-10-20 – 2014-10-21 (×3): 100 mL/h via INTRAVENOUS
  Filled 2014-10-20 (×7): qty 1000

## 2014-10-20 MED ORDER — DOCUSATE SODIUM 100 MG PO CAPS
100.0000 mg | ORAL_CAPSULE | Freq: Two times a day (BID) | ORAL | Status: DC
  Administered 2014-10-21: 100 mg via ORAL
  Filled 2014-10-20 (×2): qty 1

## 2014-10-20 MED ORDER — ZOLPIDEM TARTRATE 5 MG PO TABS
5.0000 mg | ORAL_TABLET | Freq: Every evening | ORAL | Status: DC | PRN
  Administered 2014-10-20 (×2): 5 mg via ORAL
  Filled 2014-10-20 (×2): qty 1

## 2014-10-20 MED ORDER — MORPHINE SULFATE (PF) 4 MG/ML IV SOLN: 4.0000 mg | Freq: Once | INTRAVENOUS | Status: DC

## 2014-10-20 MED ORDER — GADOBENATE DIMEGLUMINE 529 MG/ML IV SOLN
15.0000 mL | Freq: Once | INTRAVENOUS | Status: AC | PRN
  Administered 2014-10-20: 14 mL via INTRAVENOUS

## 2014-10-20 MED ORDER — PANTOPRAZOLE SODIUM 40 MG PO TBEC
40.0000 mg | DELAYED_RELEASE_TABLET | Freq: Every day | ORAL | Status: DC
  Administered 2014-10-20 – 2014-10-21 (×2): 40 mg via ORAL
  Filled 2014-10-20 (×2): qty 1

## 2014-10-20 MED ORDER — ONDANSETRON HCL 4 MG PO TABS: 4.0000 mg | ORAL_TABLET | Freq: Four times a day (QID) | ORAL | Status: DC | PRN

## 2014-10-20 MED ORDER — OXYCODONE-ACETAMINOPHEN 5-325 MG PO TABS
1.0000 | ORAL_TABLET | Freq: Four times a day (QID) | ORAL | Status: DC | PRN
  Administered 2014-10-20 – 2014-10-21 (×5): 1 via ORAL
  Filled 2014-10-20 (×5): qty 1

## 2014-10-20 MED ORDER — ACETAMINOPHEN 325 MG PO TABS: 650.0000 mg | ORAL_TABLET | Freq: Four times a day (QID) | ORAL | Status: DC | PRN

## 2014-10-20 MED ORDER — ONDANSETRON HCL 4 MG/2ML IJ SOLN
4.0000 mg | Freq: Four times a day (QID) | INTRAMUSCULAR | Status: DC | PRN
  Administered 2014-10-20 (×3): 4 mg via INTRAVENOUS
  Filled 2014-10-20 (×3): qty 2

## 2014-10-20 MED ORDER — ACETAMINOPHEN 650 MG RE SUPP: 650.0000 mg | Freq: Four times a day (QID) | RECTAL | Status: DC | PRN

## 2014-10-20 MED ORDER — LORAZEPAM 2 MG/ML IJ SOLN
1.0000 mg | Freq: Once | INTRAMUSCULAR | Status: AC
  Administered 2014-10-20: 1 mg via INTRAVENOUS
  Filled 2014-10-20: qty 1

## 2014-10-20 MED ORDER — INSULIN ASPART 100 UNIT/ML ~~LOC~~ SOLN
0.0000 [IU] | Freq: Three times a day (TID) | SUBCUTANEOUS | Status: DC
  Administered 2014-10-20: 5 [IU] via SUBCUTANEOUS
  Filled 2014-10-20: qty 2
  Filled 2014-10-20: qty 5
  Filled 2014-10-20: qty 2

## 2014-10-20 MED ORDER — HEPARIN SODIUM (PORCINE) 5000 UNIT/ML IJ SOLN
5000.0000 [IU] | Freq: Three times a day (TID) | INTRAMUSCULAR | Status: DC
  Administered 2014-10-20 – 2014-10-21 (×4): 5000 [IU] via SUBCUTANEOUS
  Filled 2014-10-20 (×5): qty 1

## 2014-10-20 MED ORDER — INSULIN GLARGINE 100 UNIT/ML ~~LOC~~ SOLN
18.0000 [IU] | Freq: Every day | SUBCUTANEOUS | Status: DC
  Administered 2014-10-20: 18 [IU] via SUBCUTANEOUS
  Filled 2014-10-20 (×2): qty 0.18

## 2014-10-20 MED ORDER — DIPHENHYDRAMINE HCL 50 MG/ML IJ SOLN: 12.5000 mg | Freq: Once | INTRAMUSCULAR | Status: DC

## 2014-10-20 MED ORDER — HYDROMORPHONE HCL 1 MG/ML IJ SOLN
1.0000 mg | INTRAMUSCULAR | Status: DC | PRN
  Administered 2014-10-20 – 2014-10-21 (×6): 1 mg via INTRAVENOUS
  Filled 2014-10-20 (×6): qty 1

## 2014-10-20 MED ORDER — HYDROMORPHONE HCL 1 MG/ML IJ SOLN
1.0000 mg | Freq: Once | INTRAMUSCULAR | Status: AC
  Administered 2014-10-20: 1 mg via INTRAVENOUS
  Filled 2014-10-20: qty 1

## 2014-10-20 NOTE — Consult Note (Signed)
GI Inpatient Consult Note  Reason for Consult: abd pain, hx acute pancreatitis.    Attending Requesting Consult: Dr Sheryle Hail  History of Present Illness:  Veronica Lozano is a 49 y.o. female with a h/o DM and HTN , pancreatitis admittted for recurrent abdominal pain. She has presented to the [facility] ED 7x in the last month complaining of severe LUQ that "feels like a heart attack".  CT abd/pelvis on 10/17/14 revealed a focal 2.5cm area of relative hypo enhancement in the proximal tail of the pancreas, lipase mildly elevated at 65. All other labs have been WNL except elevated glucose d/t uncontrolled DM II. Patient was started on creon and reglan and admitted for further evaluation.   Today, Veronica Lozano reports severe abd pain and n/v.   Abd pain is again LUQ, worse after eating, and does travel to back.  Difficult historian due to being upset and frustrated at care to this point.   Denies rectal bleeding, melena.   Reports that zofran and reglan are not helping with N/V.    Of note, patient also had a CT abd/pelvis 06/20/14 that also showed mild inflammation at head of the pancreas. CT abd/pelvis on 10/17/14 revealed a focal 2.5cm area of relative hypo enhancement in the proximal tail of the pancreas.  Lipase has been mildly elevated.   Has not yet seen GI in the outpatient setting per her report despite several months of abd pain.    Past Medical History:  Past Medical History  Diagnosis Date  . Diabetes mellitus without complication   . Pancreatitis   . Hypertension     Problem List: Patient Active Problem List   Diagnosis Date Noted  . Intractable abdominal pain 10/20/2014    Past Surgical History: Past Surgical History  Procedure Laterality Date  . Cholecystectomy    . Appendectomy    . Abdominal hysterectomy    . Cardiac catheterization    . Foot surgery Bilateral     for neuropathy    Allergies: Allergies  Allergen Reactions  . Coconut Oil Hives  . Latex Hives   . Morphine And Related Itching and Rash    Home Medications: Prescriptions prior to admission  Medication Sig Dispense Refill Last Dose  . insulin aspart (NOVOLOG) 100 UNIT/ML injection Inject 19 Units into the skin 3 (three) times daily before meals.    10/19/2014 at Unknown time  . insulin glargine (LANTUS) 100 UNIT/ML injection Inject 25 Units into the skin at bedtime.    10/19/2014 at Unknown time  . metoCLOPramide (REGLAN) 10 MG tablet Take 1 tablet (10 mg total) by mouth every 8 (eight) hours as needed for nausea or vomiting. 30 tablet 1 10/20/2014 at Unknown time  . oxyCODONE-acetaminophen (ROXICET) 5-325 MG per tablet Take 1 tablet by mouth every 6 (six) hours as needed for severe pain. 12 tablet 0 10/19/2014 at Unknown time  . Pancrelipase, Lip-Prot-Amyl, (CREON) 24000 UNITS CPEP Take 24,000 Units by mouth 3 (three) times daily with meals.   10/19/2014 at 1730  . pantoprazole (PROTONIX) 40 MG tablet Take 1 tablet (40 mg total) by mouth daily. 30 tablet 0 10/19/2014 at 1730   Home medication reconciliation was completed with the patient.   Scheduled Inpatient Medications:   . diphenhydrAMINE  12.5 mg Intravenous Once  . docusate sodium  100 mg Oral BID  . heparin  5,000 Units Subcutaneous 3 times per day  . insulin aspart  0-15 Units Subcutaneous TID WC  . insulin glargine  18  Units Subcutaneous QHS  . lipase/protease/amylase  24,000 Units Oral TID WC  . LORazepam  1 mg Intravenous Once  . pantoprazole  40 mg Oral Daily    Continuous Inpatient Infusions:   . 0.9 % NaCl with KCl 40 mEq / L 100 mL/hr (10/20/14 1321)    PRN Inpatient Medications:  acetaminophen **OR** acetaminophen, HYDROmorphone (DILAUDID) injection, metoCLOPramide, ondansetron **OR** ondansetron (ZOFRAN) IV, oxyCODONE-acetaminophen, zolpidem  Family History: The patient's family history is negative for inflammatory bowel disorders, GI malignancy, or solid organ transplantation.  Social History:   reports that  she has been smoking Cigarettes.  She has been smoking about 0.50 packs per day. She has never used smokeless tobacco. She reports that she does not drink alcohol or use illicit drugs.  Review of Systems: Constitutional: +wt loss Eyes: No changes in vision. ENT: No oral lesions, sore throat.  GI: see HPI.  Heme/Lymph+ easy bruising CV: +c/p GU: No hematuria.  Integumentary: No rashes.  Neuro: No headaches.  Psych: No depression/anxiety.  Endocrine: No heat/cold intolerance.  Allergic/Immunologic: No urticaria.  Resp: No cough, SOB.  Musculoskeletal: No joint swelling.    Physical Examination: BP 99/65 mmHg  Pulse 86  Temp(Src) 98.1 F (36.7 C) (Oral)  Resp 18  Ht  (1.499 m)  Wt 68.221 kg (150 lb 6.4 oz)  BMI 30.36 kg/m2  SpO2 95% Gen: NAD, alert and oriented x 4, appears frustrated and in some pain.  HEENT: PEERLA, EOMI, Neck: supple, no JVD or thyromegaly Chest: CTA bilaterally, no wheezes, crackles, or other adventitious sounds CV: RRR, no m/g/c/r Abd: + diffuse TTP, worse in LUQ, soft, mild abd adiposity.  Ext: no edema, well perfused with 2+ pulses, Skin: no rash or lesions noted Lymph: no LAD  Data: Lab Results  Component Value Date   WBC 11.5* 10/19/2014   HGB 15.8 10/19/2014   HCT 46.1 10/19/2014   MCV 87.8 10/19/2014   PLT 224 10/19/2014    Recent Labs Lab 10/17/14 2032 10/19/14 2205  HGB 15.6 15.8   Lab Results  Component Value Date   NA 135 10/19/2014   K 3.2* 10/19/2014   CL 98* 10/19/2014   CO2 25 10/19/2014   BUN 12 10/19/2014   CREATININE 0.54 10/19/2014   Lab Results  Component Value Date   ALT 13* 10/19/2014   AST 21 10/19/2014   ALKPHOS 76 10/19/2014   BILITOT 0.6 10/19/2014   No results for input(s): APTT, INR, PTT in the last 168 hours.   Assessment/Plan: Veronica Lozano is a 49 y.o. female with abd pain, n/v.  She did appear to have a mild pancreatitis on CT x2 with accompanying very mild elev in lipase.  Suspect her  symtpoms are a combination of mild acute pancreatitis along with some underlying functional symptomology.   She denies ETOH and is s/p CCY so would recommend MRCP to look for cause of pancreatitis, look for evidence of chronic panc. This will likely be a difficult case given the nature of pancreatitis and likely functional symptoms overlying and ongoing patient frustration.   Recommendations: - MRI pancreas - symptomatic management of pancreatitis as you are doing - consider EGD pending MRI findings and response to symptomatic management.   Thank you for the consult. Please call with questions or concerns.  Annaston Upham, Addison Naegeli, MD

## 2014-10-20 NOTE — ED Provider Notes (Signed)
[facility] Emergency Department Provider Note  ____________________________________________  Time seen:11:45 PM  I have reviewed the triage vital signs and the nursing notes.   HISTORY  Chief Complaint Abdominal Pain     HPI Veronica Lozano is a 49 y.o. female presents with 10 out of 10 sharp left upper quadrant abdominal pain. Of note patient is been seen multiple times in the emergency department for the same most recent CT scan revealed possible area of pancreatitis in the tail of pancreas. Patient now returns to emergency department with worsening left upper quadrant pain nausea. Patient denies any fever no chills no diarrhea. Of note this is the patient's seventh visit to this emergency department for the same complaint.     Past Medical History  Diagnosis Date  . Diabetes mellitus without complication   . Pancreatitis   . Hypertension     There are no active problems to display for this patient.   Past Surgical History  Procedure Laterality Date  . Cholecystectomy    . Appendectomy    . Abdominal hysterectomy    . Cardiac catheterization    . Foot surgery Bilateral     for neuropathy    Current Outpatient Rx  Name  Route  Sig  Dispense  Refill  . insulin aspart (NOVOLOG) 100 UNIT/ML injection   Subcutaneous   Inject 19 Units into the skin 3 (three) times daily before meals.          . insulin glargine (LANTUS) 100 UNIT/ML injection   Subcutaneous   Inject 25 Units into the skin at bedtime.          . metoCLOPramide (REGLAN) 10 MG tablet   Oral   Take 1 tablet (10 mg total) by mouth every 8 (eight) hours as needed for nausea or vomiting.   30 tablet   1   . oxyCODONE-acetaminophen (ROXICET) 5-325 MG per tablet   Oral   Take 1 tablet by mouth every 6 (six) hours as needed. Patient taking differently: Take 1 tablet by mouth every 6 (six) hours as needed for severe pain.    20 tablet   0   . oxyCODONE-acetaminophen  (ROXICET) 5-325 MG per tablet   Oral   Take 1 tablet by mouth every 6 (six) hours as needed for severe pain.   12 tablet   0   . Pancrelipase, Lip-Prot-Amyl, (CREON) 24000 UNITS CPEP   Oral   Take 24,000 Units by mouth 3 (three) times daily with meals.         . pantoprazole (PROTONIX) 40 MG tablet   Oral   Take 1 tablet (40 mg total) by mouth daily.   30 tablet   0     Allergies Coconut oil; Latex; and Morphine and related  History reviewed. No pertinent family history.  Social History Social History  Substance Use Topics  . Smoking status: Current Every Day Smoker -- 0.50 packs/day    Types: Cigarettes  . Smokeless tobacco: Never Used  . Alcohol Use: No    Review of Systems  Constitutional: Negative for fever. Eyes: Negative for visual changes. ENT: Negative for sore throat. Cardiovascular: Negative for chest pain. Respiratory: Negative for shortness of breath. Gastrointestinal: Positive for abdominal pain, negative for vomiting and diarrhea. Genitourinary: Negative for dysuria. Musculoskeletal: Negative for back pain. Skin: Negative for rash. Neurological: Negative for headaches, focal weakness or numbness.   10-point ROS otherwise negative.  ____________________________________________   PHYSICAL EXAM:  VITAL SIGNS: ED  Triage Vitals  Enc Vitals Group     BP 10/19/14 2201 164/103 mmHg     Pulse Rate 10/19/14 2201 106     Resp 10/19/14 2201 20     Temp 10/19/14 2201 98.6 F (37 C)     Temp Source 10/19/14 2201 Oral     SpO2 10/19/14 2201 98 %     Weight 10/19/14 2201 140 lb (63.504 kg)     Height 10/19/14 2201  (1.499 m)     Head Cir --      Peak Flow --      Pain Score 10/19/14 2202 10     Pain Loc --      Pain Edu? --      Excl. in GC? --      Constitutional: Alert and oriented. Well appearing and in no distress. Eyes: Conjunctivae are normal. PERRL. Normal extraocular movements. ENT   Head: Normocephalic and atraumatic.    Nose: No congestion/rhinnorhea.   Mouth/Throat: Mucous membranes are moist.   Neck: No stridor. Cardiovascular: Normal rate, regular rhythm. Normal and symmetric distal pulses are present in all extremities. No murmurs, rubs, or gallops. Respiratory: Normal respiratory effort without tachypnea nor retractions. Breath sounds are clear and equal bilaterally. No wheezes/rales/rhonchi. Gastrointestinal: Tender to palpation left upper quadrant. No distention. There is no CVA tenderness. Genitourinary: deferred Musculoskeletal: Nontender with normal range of motion in all extremities. No joint effusions.  No lower extremity tenderness nor edema. Neurologic:  Normal speech and language. No gross focal neurologic deficits are appreciated. Speech is normal.  Skin:  Skin is warm, dry and intact. No rash noted. Psychiatric: Mood and affect are normal. Speech and behavior are normal. Patient exhibits appropriate insight and judgment.  ____________________________________________    LABS (pertinent positives/negatives)  Labs Reviewed  LIPASE, BLOOD - Abnormal; Notable for the following:    Lipase 65 (*)    All other components within normal limits  COMPREHENSIVE METABOLIC PANEL - Abnormal; Notable for the following:    Potassium 3.2 (*)    Chloride 98 (*)    Glucose, Bld 339 (*)    ALT 13 (*)    All other components within normal limits  CBC - Abnormal; Notable for the following:    WBC 11.5 (*)    RBC 5.25 (*)    All other components within normal limits  URINALYSIS COMPLETEWITH MICROSCOPIC ([facility] ONLY)     ____________________________________________   EKG  ED ECG REPORT I, Dezirea Mccollister, Haena N, the attending physician, personally viewed and interpreted this ECG.   Date: 10/20/2014  EKG Time: 10:03 PM  Rate: 105  Rhythm: Sinus tachycardia  Axis: None  Intervals: Normal  ST&T Change: None     INITIAL IMPRESSION / ASSESSMENT AND PLAN / ED COURSE  Pertinent labs &  imaging results that were available during my care of the patient were reviewed by me and considered in my medical decision making (see chart for details). Patient received IV morphine 4 mg and Zofran 4 mg for analgesia and antiemetic respectively. Patient will be admitted to the hospital for intractable pain secondary to pancreatitis patient discussed with Dr. Sheryle Hail.   ____________________________________________   FINAL CLINICAL IMPRESSION(S) / ED DIAGNOSES  Final diagnoses:  Other acute pancreatitis      Darci Current, MD 10/20/14 [phone]

## 2014-10-20 NOTE — Progress Notes (Signed)
[facility] - [place] at [facility]   PATIENT NAME: Veronica Lozano    MR#:  [id number]  DATE OF BIRTH:  01/08/1966  SUBJECTIVE:  Pt upset earlier now decides to stay after seen by GI. vommitted   REVIEW OF SYSTEMS:   Review of Systems  Constitutional: Negative for fever, chills and weight loss.  HENT: Negative for ear discharge, ear pain and nosebleeds.   Eyes: Negative for blurred vision, pain and discharge.  Respiratory: Negative for sputum production, shortness of breath, wheezing and stridor.   Cardiovascular: Negative for chest pain, palpitations, orthopnea and PND.  Gastrointestinal: Positive for nausea, vomiting and abdominal pain. Negative for diarrhea.  Genitourinary: Negative for urgency and frequency.  Musculoskeletal: Negative for back pain and joint pain.  Neurological: Negative for sensory change, speech change, focal weakness and weakness.  Psychiatric/Behavioral: Negative for depression and hallucinations. The patient is not nervous/anxious.   All other systems reviewed and are negative.  Tolerating Diet:no Tolerating PT: not needed  DRUG ALLERGIES:   Allergies  Allergen Reactions  . Coconut Oil Hives  . Latex Hives  . Morphine And Related Itching and Rash    VITALS:  Blood pressure 99/65, pulse 86, temperature 98.1 F (36.7 C), temperature source Oral, resp. rate 18, height  (1.499 m), weight 68.221 kg (150 lb 6.4 oz), SpO2 95 %.  PHYSICAL EXAMINATION:   Physical Exam  GENERAL:  49 y.o.-year-old patient lying in the bed with no acute distress.  EYES: Pupils equal, round, reactive to light and accommodation. No scleral icterus. Extraocular muscles intact.  HEENT: Head atraumatic, normocephalic. Oropharynx and nasopharynx clear.  NECK:  Supple, no jugular venous distention. No thyroid enlargement, no tenderness.  LUNGS: Normal breath sounds bilaterally, no wheezing, rales, rhonchi. No use of accessory muscles of  respiration.  CARDIOVASCULAR: S1, S2 normal. No murmurs, rubs, or gallops.  ABDOMEN: Soft, gen tender, nondistended. Bowel sounds present. No organomegaly or mass.  EXTREMITIES: No cyanosis, clubbing or edema b/l.    NEUROLOGIC: Cranial nerves II through XII are intact. No focal Motor or sensory deficits b/l.   PSYCHIATRIC: patient is alert and oriented x 3.  SKIN: No obvious rash, lesion, or ulcer.    LABORATORY PANEL:   CBC  Recent Labs Lab 10/19/14 2205  WBC 11.5*  HGB 15.8  HCT 46.1  PLT 224    Chemistries   Recent Labs Lab 10/19/14 2205  NA 135  K 3.2*  CL 98*  CO2 25  GLUCOSE 339*  BUN 12  CREATININE 0.54  CALCIUM 9.4  AST 21  ALT 13*  ALKPHOS 76  BILITOT 0.6    Cardiac Enzymes  Recent Labs Lab 10/17/14 2032  TROPONINI <0.03    RADIOLOGY:  No results found.   ASSESSMENT AND PLAN:   49 year old Caucasian female admitted for intractable abdominal pain.  1. Abdominal pain, recurrent with abnormal CT abd with area of hypodensity near the tail of the pancreas -lipase normal Seen by G Dr Rein-->MRCP pending - patient said multiple episodes of IV narcotics without relief of pain.  - Continue Creon and Reglan 2. Diabetes mellitus type 2: Continue insulin therapy including basal insulin adjusted for hospital diet and sliding scale insulin while hospitalized. 3. GI prophylaxis: Pantoprazole 4. DVT prophylaxis: Heparin  Case discussed with Care Management/Social Worker. Management plans discussed with the patient, family and they are in agreement.  CODE STATUS: full  DVT Prophylaxis: lovenox  TOTAL TIME TAKING CARE OF THIS PATIENT: 30 minutes.  >  50% time spent on counselling and coordination of care  POSSIBLE D/C IN 1-2 DAYS, DEPENDING ON CLINICAL CONDITION.   Kyrie Fludd M.D on 10/20/2014 at 1:44 PM  Between 7am to 6pm - Pager - [phone]  After 6pm go to [email or web address] - password [email or web address] [facility]  [facility] [place] [facility]  Office   [phone]  CC: Primary care physician; Luvenia Starch PATRICIA, DO

## 2014-10-20 NOTE — H&P (Signed)
Veronica Lozano is an 49 y.o. female.   Chief Complaint: Abdominal pain HPI: The patient presents to the emergency department for the seventh time in 4 weeks complaining of abdominal pain. The patient states that her left upper quadrant hurts so bad that it "feels like a heart attack". Notably the patient denies any shortness of breath. She has had imaging for multiple occasions that shows an area of enhancement in the tail of her pancreas. Laboratory evaluation on today's visit shows normal lipase levels. She understands that she has reflux but she states that the pain is different than her usual heartburn which is exacerbated by red meat and acidic foods. Lately she has had a few episodes of bilious emesis without frank blood but admits that she has seen emesis that appears to be coffee grounds. She denies any alcohol intake. The patient has a history of gastric cancer and lymphoma in her family. Her colonoscopy 5 years ago without polyps. She also denies alcohol intake. Due to her recurrent symptoms the emergency department staff called for admission.  Past Medical History  Diagnosis Date  . Diabetes mellitus without complication   . Pancreatitis   . Hypertension     Past Surgical History  Procedure Laterality Date  . Cholecystectomy    . Appendectomy    . Abdominal hysterectomy    . Cardiac catheterization    . Foot surgery Bilateral     for neuropathy    History reviewed. No pertinent family history. Social History:  reports that she has been smoking Cigarettes.  She has been smoking about 0.50 packs per day. She has never used smokeless tobacco. She reports that she does not drink alcohol or use illicit drugs.  Allergies:  Allergies  Allergen Reactions  . Coconut Oil Hives  . Latex Hives  . Morphine And Related Itching and Rash    Medications Prior to Admission  Medication Sig Dispense Refill  . insulin aspart (NOVOLOG) 100 UNIT/ML injection Inject 19 Units into the skin 3  (three) times daily before meals.     . insulin glargine (LANTUS) 100 UNIT/ML injection Inject 25 Units into the skin at bedtime.     . metoCLOPramide (REGLAN) 10 MG tablet Take 1 tablet (10 mg total) by mouth every 8 (eight) hours as needed for nausea or vomiting. 30 tablet 1  . oxyCODONE-acetaminophen (ROXICET) 5-325 MG per tablet Take 1 tablet by mouth every 6 (six) hours as needed for severe pain. 12 tablet 0  . Pancrelipase, Lip-Prot-Amyl, (CREON) 24000 UNITS CPEP Take 24,000 Units by mouth 3 (three) times daily with meals.    . pantoprazole (PROTONIX) 40 MG tablet Take 1 tablet (40 mg total) by mouth daily. 30 tablet 0    Results for orders placed or performed during the hospital encounter of 10/19/14 (from the past 48 hour(s))  Lipase, blood     Status: Abnormal   Collection Time: 10/19/14 10:05 PM  Result Value Ref Range   Lipase 65 (H) 22 - 51 U/L  Comprehensive metabolic panel     Status: Abnormal   Collection Time: 10/19/14 10:05 PM  Result Value Ref Range   Sodium 135 135 - 145 mmol/L   Potassium 3.2 (L) 3.5 - 5.1 mmol/L   Chloride 98 (L) 101 - 111 mmol/L   CO2 25 22 - 32 mmol/L   Glucose, Bld 339 (H) 65 - 99 mg/dL   BUN 12 6 - 20 mg/dL   Creatinine, Ser 0.54 0.44 - 1.00 mg/dL  Calcium 9.4 8.9 - 10.3 mg/dL   Total Protein 7.4 6.5 - 8.1 g/dL   Albumin 4.2 3.5 - 5.0 g/dL   AST 21 15 - 41 U/L   ALT 13 (L) 14 - 54 U/L   Alkaline Phosphatase 76 38 - 126 U/L   Total Bilirubin 0.6 0.3 - 1.2 mg/dL   GFR calc non Af Amer >60 >60 mL/min   GFR calc Af Amer >60 >60 mL/min    Comment: (NOTE) The eGFR has been calculated using the CKD EPI equation. This calculation has not been validated in all clinical situations. eGFR's persistently <60 mL/min signify possible Chronic Kidney Disease.    Anion gap 12 5 - 15  CBC     Status: Abnormal   Collection Time: 10/19/14 10:05 PM  Result Value Ref Range   WBC 11.5 (H) 3.6 - 11.0 K/uL   RBC 5.25 (H) 3.80 - 5.20 MIL/uL   Hemoglobin  15.8 12.0 - 16.0 g/dL   HCT 46.1 35.0 - 47.0 %   MCV 87.8 80.0 - 100.0 fL   MCH 30.1 26.0 - 34.0 pg   MCHC 34.3 32.0 - 36.0 g/dL   RDW 13.0 11.5 - 14.5 %   Platelets 224 150 - 440 K/uL  Urinalysis complete, with microscopic ([facility] only)     Status: Abnormal   Collection Time: 10/20/14 12:31 AM  Result Value Ref Range   Color, Urine YELLOW (A) YELLOW   APPearance CLEAR (A) CLEAR   Glucose, UA >500 (A) NEGATIVE mg/dL   Bilirubin Urine NEGATIVE NEGATIVE   Ketones, ur NEGATIVE NEGATIVE mg/dL   Specific Gravity, Urine 1.024 1.005 - 1.030   Hgb urine dipstick 1+ (A) NEGATIVE   pH 6.0 5.0 - 8.0   Protein, ur NEGATIVE NEGATIVE mg/dL   Nitrite NEGATIVE NEGATIVE   Leukocytes, UA NEGATIVE NEGATIVE   RBC / HPF 0-5 0 - 5 RBC/hpf   WBC, UA 0-5 0 - 5 WBC/hpf   Bacteria, UA NONE SEEN NONE SEEN   Squamous Epithelial / LPF 0-5 (A) NONE SEEN   Mucous PRESENT    No results found.  Review of Systems  Constitutional: Negative for fever and chills.  HENT: Negative for sore throat and tinnitus.   Eyes: Negative for blurred vision and redness.  Respiratory: Negative for cough and shortness of breath.   Cardiovascular: Negative for chest pain, palpitations, orthopnea and PND.  Gastrointestinal: Positive for nausea and abdominal pain. Negative for vomiting and diarrhea.  Genitourinary: Negative for dysuria, urgency and frequency.  Musculoskeletal: Negative for myalgias and joint pain.  Skin: Negative for rash.       No lesions  Neurological: Negative for speech change, focal weakness and weakness.  Endo/Heme/Allergies: Does not bruise/bleed easily.       No temperature intolerance  Psychiatric/Behavioral: Negative for depression and suicidal ideas.    Blood pressure 112/73, pulse 95, temperature 97.8 F (36.6 C), temperature source Oral, resp. rate 18, height 4' 11"  (1.499 m), weight 68.221 kg (150 lb 6.4 oz), SpO2 99 %. Physical Exam  Nursing note and vitals reviewed. Constitutional: She is  oriented to person, place, and time. She appears well-developed and well-nourished.  HENT:  Head: Normocephalic and atraumatic.  Mouth/Throat: Oropharynx is clear and moist.  Eyes: Conjunctivae and EOM are normal. Pupils are equal, round, and reactive to light. No scleral icterus.  Neck: Normal range of motion. Neck supple. No JVD present. No tracheal deviation present. No thyromegaly present.  Cardiovascular: Normal rate, regular rhythm and  normal heart sounds.  Exam reveals no gallop and no friction rub.   No murmur heard. Respiratory: Effort normal and breath sounds normal.  GI: Soft. Bowel sounds are normal. She exhibits no distension and no mass. There is tenderness. There is guarding (voluntary). There is no rebound.  Genitourinary:  Deferred  Musculoskeletal: Normal range of motion. She exhibits no edema.  Lymphadenopathy:    She has no cervical adenopathy.  Neurological: She is alert and oriented to person, place, and time. No cranial nerve deficit. She exhibits normal muscle tone.  Skin: Skin is warm and dry.  Psychiatric: She has a normal mood and affect. Judgment and thought content normal.     Assessment/Plan This is a 49 year old Caucasian female admitted for intractable abdominal pain. 1. Abdominal pain: The patient said multiple episodes of IV narcotics without relief of pain. She also has suspicious areas seen on CT imaging that warrant further evaluation to rule out possible mass or focal areas of pancreatitis. I place a gastroenterology consult. Continue Creon and Reglan 2. Diabetes mellitus type 2: Continue insulin therapy including basal insulin adjusted for hospital diet and sliding scale insulin while hospitalized. 3. GI prophylaxis: Pantoprazole 4. DVT prophylaxis: Heparin The patient is a full code. Time spent on admission orders and patient care approximately 35 minutes  Harrie Foreman 10/20/2014, 6:56 AM

## 2014-10-20 NOTE — ED Notes (Signed)
Dr Diamond at bedside. 

## 2014-10-20 NOTE — Care Management Note (Signed)
Case Management Note  Patient Details  Name: Veronica Lozano MRN: [id number] Date of Birth: 08/26/1965  Subjective/Objective:                   Met with patient to assess discharge planning needs. Patient said, "I don't feel worth a shit". She said "I have pancreatitis". She states she also has diabetes. She has no established PCP but states she has an appointment August 31 at [facility] in [place]  [phone]. She states she lives with her husband; both drive. She states she is independent with daily acitvities. She uses [facility] on [address]. For Rx. She denies RNCM needs. Action/Plan:  I contacted [facility] to check follow up appointment- confirmed. No current RNCM needs.   Expected Discharge Date:                  Expected Discharge Plan:     In-House Referral:     Discharge planning Services  CM Consult  Post Acute Care Choice:    Choice offered to:  Patient  DME Arranged:  N/A DME Agency:     HH Arranged:  NA HH Agency:     Status of Service:  Completed, signed off  Medicare Important Message Given:    Date Medicare IM Given:    Medicare IM give by:    Date Additional Medicare IM Given:    Additional Medicare Important Message give by:     If discussed at [place] of Stay Meetings, dates discussed:    Additional Comments:  Marshell Garfinkel, RN 10/20/2014, 11:31 AM

## 2014-10-20 NOTE — Discharge Summary (Signed)
[facility] [place] at [facility]   PATIENT NAME: Veronica Lozano    MR#:  [id number]  DATE OF BIRTH:  12/07/1965  DATE OF ADMISSION:  10/19/2014 ADMITTING PHYSICIAN: Arnaldo Natal, MD  DATE OF DISCHARGE: pt left AMA  PRIMARY CARE PHYSICIAN: Luvenia Starch PATRICIA, DO    ADMISSION DIAGNOSIS:  Other acute pancreatitis [K85.8]  DISCHARGE DIAGNOSIS:  Unable to give final diagnosis since pt left AMA  SECONDARY DIAGNOSIS:   Past Medical History  Diagnosis Date  . Diabetes mellitus without complication   . Pancreatitis   . Hypertension      Mrs. Culley is a 49 year old Caucasian female with past medical history of diabetes who came to the emergency room with recurrent abdominal pain. This was apparently her seventh visit to the emergency room.  patient was admitted with normal CT scan with possible area of high attenuation in the tail of the pancreas. She was getting IV pain medications. GI consultation has been placed. Patient was was upset and wanted to see a GI right away. She was explained Dr. Adele Dan I will see her later today. Patient was not happy about it and decided to leave AMA.  CONSULTS OBTAINED:  Treatment Team:  Elnita Maxwell, MD  DRUG ALLERGIES:   Allergies  Allergen Reactions  . Coconut Oil Hives  . Latex Hives  . Morphine And Related Itching and Rash    DISCHARGE MEDICATIONS:   Current Discharge Medication List    CONTINUE these medications which have NOT CHANGED   Details  insulin aspart (NOVOLOG) 100 UNIT/ML injection Inject 19 Units into the skin 3 (three) times daily before meals.     insulin glargine (LANTUS) 100 UNIT/ML injection Inject 25 Units into the skin at bedtime.     metoCLOPramide (REGLAN) 10 MG tablet Take 1 tablet (10 mg total) by mouth every 8 (eight) hours as needed for nausea or vomiting. Qty: 30 tablet, Refills: 1    oxyCODONE-acetaminophen (ROXICET) 5-325 MG per tablet Take 1 tablet by  mouth every 6 (six) hours as needed for severe pain. Qty: 12 tablet, Refills: 0    Pancrelipase, Lip-Prot-Amyl, (CREON) 24000 UNITS CPEP Take 24,000 Units by mouth 3 (three) times daily with meals.    pantoprazole (PROTONIX) 40 MG tablet Take 1 tablet (40 mg total) by mouth daily. Qty: 30 tablet, Refills: 0        If you experience worsening of your admission symptoms, develop shortness of breath, life threatening emergency, suicidal or homicidal thoughts you must seek medical attention immediately by calling 911 or calling your MD immediately  if symptoms less severe.  You Must read complete instructions/literature along with all the possible adverse reactions/side effects for all the Medicines you take and that have been prescribed to you. Take any new Medicines after you have completely understood and accept all the possible adverse reactions/side effects.   Please note  You were cared for by a hospitalist during your hospital stay. If you have any questions about your discharge medications or the care you received while you were in the hospital after you are discharged, you can call the unit and asked to speak with the hospitalist on call if the hospitalist that took care of you is not available. Once you are discharged, your primary care physician will handle any further medical issues. Please note that NO REFILLS for any discharge medications will be authorized once you are discharged, as it is imperative that you return to your primary  care physician (or establish a relationship with a primary care physician if you do not have one) for your aftercare needs so that they can reassess your need for medications and monitor your lab values.  CODE STATUS:     Code Status Orders        Start     Ordered   10/20/14 0201  Full code   Continuous     10/20/14 0200      TOTAL TIME TAKING CARE OF THIS PATIENT:85minutes.    Melika Reder M.D on 10/20/2014 at 12:23 PM  Between 7am to 6pm -  Pager - [phone] After 6pm go to [email or web address] - password [email or web address] [facility]  [facility]  Office  [phone]  CC: Primary care physician; Luvenia Starch PATRICIA, DO

## 2014-10-20 NOTE — Progress Notes (Signed)
Initial Nutrition Assessment   INTERVENTION:   Coordination of Care: await diet progression as able s/p procedure.   NUTRITION DIAGNOSIS:   Inadequate oral intake related to inability to eat as evidenced by NPO status.  GOAL:   Patient will meet greater than or equal to 90% of their needs  MONITOR:    (Energy Intake, Electrolyte and renal Profile, Gastrointestinal Profile, Anthropometrics)  REASON FOR ASSESSMENT:   Malnutrition Screening Tool    ASSESSMENT:   Pt admitted with intractible abdominal pain. Per MD note, pt scheduled for MRCP secondary to abnormal CT of tail of pancreas. Pt out of room on visit.   Past Medical History  Diagnosis Date  . Diabetes mellitus without complication   . Pancreatitis   . Hypertension      Diet Order:  Diet NPO time specified Except for: Sips with Meds    Current Nutrition: Pt NPO  Food/Nutrition-Related History: Per MST pt reported decreased appetite PTA.   Medications: Colace, Novolog, Lantus, Creon, Protonix, NS with KCL at 193mL/hr  Electrolyte/Renal Profile and Glucose Profile:   Recent Labs Lab 10/17/14 2032 10/19/14 2205  NA 132* 135  K 3.5 3.2*  CL 95* 98*  CO2 23 25  BUN 12 12  CREATININE 0.80 0.54  CALCIUM 8.7* 9.4  GLUCOSE 435* 339*   Protein Profile:   Recent Labs Lab 10/17/14 2032 10/19/14 2205  ALBUMIN 3.9 4.2   Lipase     Component Value Date/Time   LIPASE 65* 10/19/2014 2205    Gastrointestinal Profile: Last BM: 10/20/2014   Nutrition-Focused Physical Exam Findings:  Unable to complete Nutrition-Focused physical exam at this time.    Weight Change: Per MST pt reported weight loss PTA. Per weight encounters pt with weight gain over the past month.  Height:   Ht Readings from Last 1 Encounters:  10/19/14  (1.499 m)    Weight:   Wt Readings from Last 1 Encounters:  10/20/14 150 lb 6.4 oz (68.221 kg)   Wt Readings from Last 10 Encounters:  10/20/14 150 lb 6.4 oz  (68.221 kg)  10/20/14 150 lb (68.04 kg)  10/17/14 140 lb (63.504 kg)  10/10/14 144 lb (65.318 kg)  10/10/14 144 lb (65.318 kg)  09/19/14 146 lb (66.225 kg)    BMI:  Body mass index is 30.36 kg/(m^2).  Estimated Nutritional Needs:   Kcal:  1741-2058kcals, BEE: 1218kcals, TEE: (IF 1.1-1.3)(AF 1.3)   Protein:  55-68g protein (0.8-1.0g/kg)   Fluid:  1705-2024mL of fluid (25-90mL/kg)  EDUCATION NEEDS:   Education needs no appropriate at this time    MODERATE Care Level  Leda Quail, RD, LDN Pager [phone]

## 2014-10-20 NOTE — ED Notes (Signed)
Attempted to give report.  Floor states receiving nurse will call back this nurse in a few minutes

## 2014-10-21 LAB — BASIC METABOLIC PANEL
Anion gap: 7 (ref 5–15)
BUN: 7 mg/dL (ref 6–20)
CHLORIDE: 106 mmol/L (ref 101–111)
CO2: 23 mmol/L (ref 22–32)
CREATININE: 0.46 mg/dL (ref 0.44–1.00)
Calcium: 8.5 mg/dL — ABNORMAL LOW (ref 8.9–10.3)
GFR calc non Af Amer: >60 mL/min (ref >60)
Glucose, Bld: 148 mg/dL — ABNORMAL HIGH (ref 65–99)
POTASSIUM: 3.8 mmol/L (ref 3.5–5.1)
SODIUM: 136 mmol/L (ref 135–145)

## 2014-10-21 LAB — GLUCOSE, CAPILLARY
Glucose-Capillary: 139 mg/dL — ABNORMAL HIGH (ref 65–99)
Glucose-Capillary: 156 mg/dL — ABNORMAL HIGH (ref 65–99)

## 2014-10-21 NOTE — Progress Notes (Signed)
Per Dr. Enedina Finner, pt may have clear liquid diet, check potassium in bmp, and start ns 0.9% at 70cc per hour if potassium is normal. Per verbal order.

## 2014-10-21 NOTE — Progress Notes (Signed)
Notified Dr. Enedina Finner and Thurston Hole, supervisor via telephone that patient is leaving AMA. Pt is alert and oriented x 4, c/o abdominal pain, received IV dilaudid and po roxicet, ambulating around unit, aggravated that she cant leave the unit to smoke, diet advanced to clear liquid diet, pt leaving AMA per pt request.

## 2014-10-25 DIAGNOSIS — Z8619 Personal history of other infectious and parasitic diseases: Secondary | ICD-10-CM | POA: Insufficient documentation

## 2014-10-30 DIAGNOSIS — G4700 Insomnia, unspecified: Secondary | ICD-10-CM | POA: Insufficient documentation

## 2014-11-09 ENCOUNTER — Other Ambulatory Visit: Payer: Self-pay | Admitting: Family Medicine

## 2014-11-09 DIAGNOSIS — Z1231 Encounter for screening mammogram for malignant neoplasm of breast: Secondary | ICD-10-CM

## 2014-11-16 ENCOUNTER — Ambulatory Visit
Admission: RE | Admit: 2014-11-16 | Discharge: 2014-11-16 | Disposition: A | Discharge status: Home / Self Care | Payer: PRIVATE HEALTH INSURANCE | Source: Ambulatory Visit | Attending: Family Medicine | Admitting: Family Medicine

## 2014-11-16 DIAGNOSIS — Z1231 Encounter for screening mammogram for malignant neoplasm of breast: Secondary | ICD-10-CM | POA: Insufficient documentation

## 2015-02-09 ENCOUNTER — Emergency Department
Admission: EM | Admit: 2015-02-09 | Discharge: 2015-02-09 | Discharge status: Against Medical Advice | Payer: PRIVATE HEALTH INSURANCE | Attending: Emergency Medicine | Admitting: Emergency Medicine

## 2015-02-09 ENCOUNTER — Emergency Department: Payer: PRIVATE HEALTH INSURANCE

## 2015-02-09 ENCOUNTER — Encounter: Payer: Self-pay | Admitting: Medical Oncology

## 2015-02-09 DIAGNOSIS — Z794 Long term (current) use of insulin: Secondary | ICD-10-CM | POA: Diagnosis not present

## 2015-02-09 DIAGNOSIS — K861 Other chronic pancreatitis: Secondary | ICD-10-CM | POA: Diagnosis not present

## 2015-02-09 DIAGNOSIS — E119 Type 2 diabetes mellitus without complications: Secondary | ICD-10-CM | POA: Insufficient documentation

## 2015-02-09 DIAGNOSIS — R1013 Epigastric pain: Secondary | ICD-10-CM | POA: Diagnosis present

## 2015-02-09 DIAGNOSIS — Z91040 Latex allergy status: Secondary | ICD-10-CM | POA: Insufficient documentation

## 2015-02-09 DIAGNOSIS — F17210 Nicotine dependence, cigarettes, uncomplicated: Secondary | ICD-10-CM | POA: Diagnosis not present

## 2015-02-09 DIAGNOSIS — I10 Essential (primary) hypertension: Secondary | ICD-10-CM | POA: Insufficient documentation

## 2015-02-09 DIAGNOSIS — Z79899 Other long term (current) drug therapy: Secondary | ICD-10-CM | POA: Diagnosis not present

## 2015-02-09 DIAGNOSIS — R000 Tachycardia, unspecified: Secondary | ICD-10-CM | POA: Diagnosis not present

## 2015-02-09 LAB — COMPREHENSIVE METABOLIC PANEL
ALBUMIN: 4 g/dL (ref 3.5–5.0)
ALT: 17 U/L (ref 14–54)
ANION GAP: 13 (ref 5–15)
AST: 25 U/L (ref 15–41)
Alkaline Phosphatase: 95 U/L (ref 38–126)
BUN: 9 mg/dL (ref 6–20)
CALCIUM: 8.9 mg/dL (ref 8.9–10.3)
CHLORIDE: 96 mmol/L — AB (ref 101–111)
CO2: 21 mmol/L — AB (ref 22–32)
Creatinine, Ser: 0.54 mg/dL (ref 0.44–1.00)
GFR calc non Af Amer: >60 mL/min (ref >60)
GLUCOSE: 410 mg/dL — AB (ref 65–99)
POTASSIUM: 4.4 mmol/L (ref 3.5–5.1)
SODIUM: 130 mmol/L — AB (ref 135–145)
Total Bilirubin: 1.9 mg/dL — ABNORMAL HIGH (ref 0.3–1.2)
Total Protein: 7.2 g/dL (ref 6.5–8.1)

## 2015-02-09 LAB — CBC
HCT: 48.2 % — ABNORMAL HIGH (ref 35.0–47.0)
Hemoglobin: 16.3 g/dL — ABNORMAL HIGH (ref 12.0–16.0)
MCH: 30.3 pg (ref 26.0–34.0)
MCHC: 33.9 g/dL (ref 32.0–36.0)
MCV: 89.4 fL (ref 80.0–100.0)
PLATELETS: 220 10*3/uL (ref 150–440)
RBC: 5.38 MIL/uL — AB (ref 3.80–5.20)
RDW: 12.7 % (ref 11.5–14.5)
WBC: 10.4 10*3/uL (ref 3.6–11.0)

## 2015-02-09 LAB — URINALYSIS COMPLETE WITH MICROSCOPIC (ARMC ONLY)
Bacteria, UA: NONE SEEN
Bilirubin Urine: NEGATIVE
Glucose, UA: >500 mg/dL — AB
LEUKOCYTES UA: NEGATIVE
Nitrite: NEGATIVE
PH: 5 (ref 5.0–8.0)
PROTEIN: NEGATIVE mg/dL
SPECIFIC GRAVITY, URINE: 1.039 — AB (ref 1.005–1.030)

## 2015-02-09 LAB — TROPONIN I: Troponin I: <0.03 ng/mL (ref <0.031)

## 2015-02-09 LAB — LIPASE, BLOOD: LIPASE: 54 U/L — AB (ref 11–51)

## 2015-02-09 MED ORDER — SODIUM CHLORIDE 0.9 % IV BOLUS (SEPSIS)
1000.0000 mL | Freq: Once | INTRAVENOUS | Status: AC
  Administered 2015-02-09: 1000 mL via INTRAVENOUS

## 2015-02-09 MED ORDER — HYDROMORPHONE HCL 1 MG/ML IJ SOLN
INTRAMUSCULAR | Status: AC
  Administered 2015-02-09: 1 mg via INTRAVENOUS
  Filled 2015-02-09: qty 1

## 2015-02-09 MED ORDER — HYDROMORPHONE HCL 1 MG/ML IJ SOLN
1.0000 mg | Freq: Once | INTRAMUSCULAR | Status: AC
  Administered 2015-02-09: 1 mg via INTRAVENOUS

## 2015-02-09 MED ORDER — ONDANSETRON HCL 4 MG/2ML IJ SOLN
4.0000 mg | Freq: Once | INTRAMUSCULAR | Status: AC
  Administered 2015-02-09: 4 mg via INTRAVENOUS
  Filled 2015-02-09: qty 2

## 2015-02-09 MED ORDER — HYDROMORPHONE HCL 1 MG/ML IJ SOLN
1.0000 mg | Freq: Once | INTRAMUSCULAR | Status: AC
  Administered 2015-02-09: 1 mg via INTRAVENOUS
  Filled 2015-02-09: qty 1

## 2015-02-09 NOTE — ED Notes (Signed)
Pt ambulatory to triage with reports that she began having epigastric pain that radiates into left side of chest and through to her back 3-4 days ago. Reports she has a hx of pancreatitis and recently ran out of her creon. PT also reports nausea and vomiting with pain.

## 2015-02-09 NOTE — ED Provider Notes (Signed)
[facility] Emergency Department Provider Note   ____________________________________________  Time seen: 1530  I have reviewed the triage vital signs and the nursing notes.   HISTORY  Chief Complaint Chest Pain; Abdominal Pain; and Nausea   History limited by: Not Limited   HPI Veronica Lozano is a 49 y.o. female with history of chronic pancreatitis, followed at [facility], who presents to the emergency department today because of concerns for epigastric pain. She states it started a couple of days ago. It is gradually gotten worse. She describes it as being located in the epigastrium and left lower chest. It does radiate to her back. It is severe. She states it feels like her previous episodes of pancreatitis. She thinks it might be related to her running out of her Creon medicine. She states that since moving to [place] roughly a year ago she has not been set up to get the Creon at a reduced rate. She states that the doctors at [facility] or working on trying to get her her Creon. She states she also ran out of her oral narcotic medication at home and does not have a pain management appointment until next month. She states she has had some subjective low-grade fevers. She has had some nonbloody emesis. She has chronic diarrhea.  Past Medical History  Diagnosis Date  . Diabetes mellitus without complication (HCC)   . Pancreatitis   . Hypertension     Patient Active Problem List   Diagnosis Date Noted  . Intractable abdominal pain 10/20/2014    Past Surgical History  Procedure Laterality Date  . Cholecystectomy    . Appendectomy    . Abdominal hysterectomy    . Cardiac catheterization    . Foot surgery Bilateral     for neuropathy    Current Outpatient Rx  Name  Route  Sig  Dispense  Refill  . insulin aspart (NOVOLOG) 100 UNIT/ML injection   Subcutaneous   Inject 19 Units into the skin 3 (three) times daily before meals.          . insulin  glargine (LANTUS) 100 UNIT/ML injection   Subcutaneous   Inject 25 Units into the skin at bedtime.          . metoCLOPramide (REGLAN) 10 MG tablet   Oral   Take 1 tablet (10 mg total) by mouth every 8 (eight) hours as needed for nausea or vomiting.   30 tablet   1   . oxyCODONE-acetaminophen (ROXICET) 5-325 MG per tablet   Oral   Take 1 tablet by mouth every 6 (six) hours as needed for severe pain.   12 tablet   0   . Pancrelipase, Lip-Prot-Amyl, (CREON) 24000 UNITS CPEP   Oral   Take 24,000 Units by mouth 3 (three) times daily with meals.         . pantoprazole (PROTONIX) 40 MG tablet   Oral   Take 1 tablet (40 mg total) by mouth daily.   30 tablet   0     Allergies Coconut oil; Latex; and Morphine and related  Family History  Problem Relation Age of Onset  . Breast cancer Sister     Social History Social History  Substance Use Topics  . Smoking status: Current Every Day Smoker -- 0.50 packs/day    Types: Cigarettes  . Smokeless tobacco: Never Used  . Alcohol Use: No    Review of Systems  Constitutional: Negative for fever. Cardiovascular: Negative for chest pain.  Respiratory: Negative for shortness of breath. Gastrointestinal: Positive for epigastric and left lower chest pain. Genitourinary: Negative for dysuria. Musculoskeletal: Negative for back pain. Skin: Negative for rash. Neurological: Negative for headaches, focal weakness or numbness.  10-point ROS otherwise negative.  ____________________________________________   PHYSICAL EXAM:  VITAL SIGNS: ED Triage Vitals  Enc Vitals Group     BP 02/09/15 1410 154/98 mmHg     Pulse Rate 02/09/15 1410 127     Resp 02/09/15 1410 20     Temp 02/09/15 1410 98.8 F (37.1 C)     Temp Source 02/09/15 1410 Oral     SpO2 02/09/15 1410 99 %     Weight 02/09/15 1410 148 lb (67.132 kg)     Height 02/09/15 1410  (1.499 m)     Head Cir --      Peak Flow --      Pain Score 02/09/15 1412 9    Constitutional: Alert and oriented. Well appearing and in no distress. Eyes: Conjunctivae are normal. PERRL. Normal extraocular movements. ENT   Head: Normocephalic and atraumatic.   Nose: No congestion/rhinnorhea.   Mouth/Throat: Mucous membranes are moist.   Neck: No stridor. Hematological/Lymphatic/Immunilogical: No cervical lymphadenopathy. Cardiovascular: Tachycardic, regular rhythm.  No murmurs, rubs, or gallops. Respiratory: Normal respiratory effort without tachypnea nor retractions. Breath sounds are clear and equal bilaterally. No wheezes/rales/rhonchi. Gastrointestinal: Soft and minimally tender to palpation in the left upper quadrant and epigastric region. No rebound. No guarding. Genitourinary: Deferred Musculoskeletal: Normal range of motion in all extremities. No joint effusions.  No lower extremity tenderness nor edema. Neurologic:  Normal speech and language. No gross focal neurologic deficits are appreciated.  Skin:  Skin is warm, dry and intact. No rash noted. Psychiatric: Mood and affect are normal. Speech and behavior are normal. Patient exhibits appropriate insight and judgment.  ____________________________________________    LABS (pertinent positives/negatives)  Labs Reviewed  LIPASE, BLOOD - Abnormal; Notable for the following:    Lipase 54 (*)    All other components within normal limits  COMPREHENSIVE METABOLIC PANEL - Abnormal; Notable for the following:    Sodium 130 (*)    Chloride 96 (*)    CO2 21 (*)    Glucose, Bld 410 (*)    Total Bilirubin 1.9 (*)    All other components within normal limits  CBC - Abnormal; Notable for the following:    RBC 5.38 (*)    Hemoglobin 16.3 (*)    HCT 48.2 (*)    All other components within normal limits  TROPONIN I  URINALYSIS COMPLETEWITH MICROSCOPIC ([facility] ONLY)    ____________________________________________   EKG  I, Phineas Semen, attending physician, personally viewed and  interpreted this EKG  EKG Time: 1413 Rate: 119 Rhythm: sinus tachycardia Axis: normal Intervals: qtc 458 QRS: narrow ST changes: no st elevation Impression: normal ekg ____________________________________________    RADIOLOGY  CXR IMPRESSION: No active cardiopulmonary disease.  ____________________________________________   PROCEDURES  Procedure(s) performed: None  Critical Care performed: No  ____________________________________________   INITIAL IMPRESSION / ASSESSMENT AND PLAN / ED COURSE  Pertinent labs & imaging results that were available during my care of the patient were reviewed by me and considered in my medical decision making (see chart for details).  Patient presents to the emergency department today with pain consistent with her previous episodes pancreatitis. Lab work does not show any leukocytosis, mild elevation of her lipase. Patient was mildly tachycardic. Will give fluids IV pain medication and nausea medication  and reassess.   Patient told one of the staff that she was ready to go. I was managing a septic patient and could not evaluate the patient prior to her leaving the department.  ____________________________________________   FINAL CLINICAL IMPRESSION(S) / ED DIAGNOSES  Final diagnoses:  Epigastric pain  Chronic pancreatitis, unspecified pancreatitis type (HCC)     Phineas SemenGraydon Dellia Donnelly, MD 02/09/15 1941

## 2015-02-09 NOTE — ED Notes (Signed)
Pt has been called for a room multiple times with no response.

## 2015-02-09 NOTE — ED Notes (Signed)
Pt was sitting in subwait area, did not leave hospital, will be put in a room as soon as one comes open.

## 2015-02-18 ENCOUNTER — Emergency Department
Admission: EM | Admit: 2015-02-18 | Discharge: 2015-02-18 | Discharge status: Absent without leave | Payer: PRIVATE HEALTH INSURANCE

## 2015-03-19 ENCOUNTER — Ambulatory Visit (INDEPENDENT_AMBULATORY_CARE_PROVIDER_SITE_OTHER): Payer: BLUE CROSS/BLUE SHIELD | Admitting: Physician Assistant

## 2015-03-19 ENCOUNTER — Encounter: Payer: Self-pay | Admitting: Physician Assistant

## 2015-03-19 ENCOUNTER — Other Ambulatory Visit: Payer: Self-pay | Admitting: Physician Assistant

## 2015-03-19 VITALS — BP 112/70 | HR 98 | Temp 98.9°F | Resp 20 | Ht 59.0 in | Wt 147.0 lb

## 2015-03-19 DIAGNOSIS — K860 Alcohol-induced chronic pancreatitis: Secondary | ICD-10-CM

## 2015-03-19 DIAGNOSIS — G4700 Insomnia, unspecified: Secondary | ICD-10-CM

## 2015-03-19 DIAGNOSIS — E119 Type 2 diabetes mellitus without complications: Secondary | ICD-10-CM | POA: Insufficient documentation

## 2015-03-19 DIAGNOSIS — E1042 Type 1 diabetes mellitus with diabetic polyneuropathy: Secondary | ICD-10-CM | POA: Diagnosis not present

## 2015-03-19 DIAGNOSIS — R030 Elevated blood-pressure reading, without diagnosis of hypertension: Secondary | ICD-10-CM

## 2015-03-19 DIAGNOSIS — K861 Other chronic pancreatitis: Secondary | ICD-10-CM | POA: Insufficient documentation

## 2015-03-19 DIAGNOSIS — IMO0001 Reserved for inherently not codable concepts without codable children: Secondary | ICD-10-CM | POA: Insufficient documentation

## 2015-03-19 LAB — POCT GLYCOSYLATED HEMOGLOBIN (HGB A1C): Hemoglobin A1C: 14

## 2015-03-19 MED ORDER — INSULIN GLARGINE 100 UNIT/ML ~~LOC~~ SOLN: 35.0000 [IU] | Freq: Every day | SUBCUTANEOUS | Status: DC

## 2015-03-19 MED ORDER — ZOLPIDEM TARTRATE ER 12.5 MG PO TBCR: 12.5000 mg | EXTENDED_RELEASE_TABLET | Freq: Every evening | ORAL | Status: DC | PRN

## 2015-03-19 NOTE — Patient Instructions (Signed)
Type 1 Diabetes Mellitus, Adult Type 1 diabetes mellitus, often simply referred to as diabetes, is a long-term (chronic) disease. It occurs when the islet cells in the pancreas that make insulin (a hormone) are destroyed and can no longer make insulin. Insulin is needed to move sugars from food into the tissue cells. The tissue cells use the sugars for energy. In people with type 1 diabetes, the sugars build up in the blood instead of going into the tissue cells. As a result, high blood sugar (hyperglycemia) develops. Without insulin, the body breaks down fat cells for the needed energy. This breakdown of fat cells produces acid chemicals (ketones), which increases the acid levels in the body. The effect of either high ketone or high sugar (glucose) levels can be life-threatening.  Type 1 diabetes was also previously called juvenile diabetes. It most often occurs before the age of 30, but it can occur at any age. RISK FACTORS A person is predisposed to developing type 1 diabetes if someone in his or her family has the disease and is exposed to certain additional environmental triggers.  SYMPTOMS  Symptoms of type 1 diabetes may develop gradually over days to weeks or suddenly. The symptoms occur due to hyperglycemia. The symptoms can include:   Increased thirst (polydipsia).  Increased urination (polyuria).  Increased urination during the night (nocturia).  Weight loss. This weight loss may be rapid.  Frequent, recurring infections.  Tiredness (fatigue).  Weakness.  Vision changes, such as blurred vision.  Fruity smell to your breath.  Abdominal pain.  Nausea or vomiting.  An open skin wound (ulcer). DIAGNOSIS  Type 1 diabetes is diagnosed when symptoms of diabetes are present and when blood glucose levels are increased. Your blood glucose level may be checked by one or more of the following blood tests:  A fasting blood glucose test. You will not be allowed to eat for at least 8  hours before a blood sample is taken.  A random blood glucose test. Your blood glucose is checked at any time of the day regardless of when you ate.  A hemoglobin A1c blood glucose test. A hemoglobin A1c test provides information about blood glucose control over the previous 3 months. TREATMENT  Although type 1 diabetes cannot be prevented, it can be managed with insulin, diet, and exercise.  You will need to take insulin daily to keep blood glucose in the desired range.  You will need to match insulin dosing with exercise and healthy food choices. Generally, the goal of treatment is to maintain a pre-meal (preprandial) blood glucose level of 80-130 mg/dL. HOME CARE INSTRUCTIONS   Have your hemoglobin A1c level checked twice a year.  Perform daily blood glucose monitoring as directed by your health care provider.  Monitor urine ketones when you are ill and as directed by your health care provider.  Take your insulin as directed by your health care provider to maintain your blood glucose level in the desired range.  Never run out of insulin. It is needed every day.  Adjust insulin based on your intake of carbohydrates. Carbohydrates can raise blood glucose levels but need to be included in your diet. Carbohydrates provide vitamins, minerals, and fiber, which are an essential part of a healthy diet. Carbohydrates are found in fruits, vegetables, whole grains, dairy products, legumes, and foods containing added sugars.  Eat healthy foods. Alternate 3 meals with 3 snacks.  Maintain a healthy weight.  Carry a medical alert card or wear your medical alert   jewelry.  Carry a 15-gram carbohydrate snack with you at all times to treat low blood glucose (hypoglycemia). Some examples of 15-gram carbohydrate snacks include:  Glucose tablets, 3 or 4.  Glucose gel, 15-gram tube.  Raisins, 2 tablespoons (24 grams).  Jelly beans, 6.  Animal crackers, 8.  Fruit juice, regular soda, or  low-fat milk, 4 ounces (120 mL).  Gummy treats, 9.  Recognize hypoglycemia. Hypoglycemia occurs with blood glucose levels of 70 mg/dL and below. The risk for hypoglycemia increases when fasting or skipping meals, during or after intense exercise, and during sleep. Hypoglycemia symptoms can include:  Tremors or shakes.  Decreased ability to concentrate.  Sweating.  Increased heart rate.  Headache.  Dry mouth.  Hunger.  Irritability.  Anxiety.  Restless sleep.  Altered speech or coordination.  Confusion.  Treat hypoglycemia promptly. If you are alert and able to safely swallow, follow the 15:15 rule:  Take 15-20 grams of rapid-acting glucose or carbohydrate. Rapid-acting options include glucose gel, glucose tablets, or 4 ounces (120 mL) of fruit juice, regular soda, or low-fat milk.  Check your blood glucose level 15 minutes after taking the glucose.  Take 15-20 grams more of glucose if the repeat blood glucose level is still 70 mg/dL or below.  Eat a meal or snack within 1 hour once blood glucose levels return to normal.  Be alert to polyuria and polydipsia, which are early signs of hyperglycemia. An early awareness of hyperglycemia allows for prompt treatment. Treat hyperglycemia as directed by your health care provider.  Exercise regularly as directed by your health care provider. This includes:  Stretching and performing strength training exercises, such as yoga or weight lifting, at least 2 times per week.  Performing a total of at least 150 minutes of moderate-intensity exercise each week, such as brisk walking or water aerobics.  Exercising at least 3 days per week, making sure you allow no more than 2 consecutive days to pass without exercising.  Avoiding long periods of inactivity (90 minutes or more). When you have to spend an extended period of time sitting down, take frequent breaks to walk or stretch.  Adjust your insulin dosing and food intake as needed  if you start a new exercise or sport.  Follow your sick-day plan at any time you are unable to eat or drink as usual.   Do not use any tobacco products including cigarettes, chewing tobacco, or electronic cigarettes. If you need help quitting, ask your health care provider.  Limit alcohol intake to no more than 1 drink per day for nonpregnant women and 2 drinks per day for men. You should drink alcohol only when you are also eating food. Talk with your health care provider about whether alcohol is safe for you. Tell your health care provider if you drink alcohol several times a week.  Keep all follow-up visits as directed by your health care provider.  Schedule an eye exam within 5 years of diagnosis and then annually.  Perform daily skin and foot care. Examine your skin and feet daily for cuts, bruises, redness, nail problems, bleeding, blisters, or sores. A foot exam should be done by a health care provider 5 years after diagnosis, and then every year after the first exam.  Brush your teeth and gums at least twice a day and floss at least once a day. Follow up with your dentist regularly.  Share your diabetes management plan with your workplace or school.  Keep your immunizations up to date. It   is recommended that you receive a flu (influenza) vaccine every year. It is also recommended that you receive a pneumonia (pneumococcal) vaccine. If you are 65 years of age or older and have never received a pneumonia vaccine, this vaccine may be given as a series of two separate shots. Ask your health care provider which additional vaccines may be recommended.  Learn to manage stress.  Obtain ongoing diabetes education and support as needed.  Participate in or seek rehabilitation as needed to maintain or improve independence and quality of life. Request a physical or occupational therapy referral if you are having foot or hand numbness, or difficulties with grooming, dressing, eating, or physical  activity. SEEK MEDICAL CARE IF:   You are unable to eat food or drink fluids for more than 6 hours.  You have nausea and vomiting for more than 6 hours.  Your blood glucose level is over 240 mg/dL.  There is a change in mental status.  You develop an additional serious illness.  You have diarrhea for more than 6 hours.  You have been sick or have had a fever for a couple of days and are not getting better.  You have pain during any physical activity. SEEK IMMEDIATE MEDICAL CARE IF:  You have difficulty breathing.  You have moderate to large ketone levels. MAKE SURE YOU:  Understand these instructions.  Will watch your condition.  Will get help right away if you are not doing well or get worse.   This information is not intended to replace advice given to you by your health care provider. Make sure you discuss any questions you have with your health care provider.   Document Released: 02/08/2000 Document Revised: 11/01/2014 Document Reviewed: 09/09/2011 Elsevier Interactive Patient Education 2016 [employer]  

## 2015-03-19 NOTE — Telephone Encounter (Signed)
RX called in.   Thanks,   -Mihcael Ledee  

## 2015-03-19 NOTE — Telephone Encounter (Signed)
Pt stated she just left [facility] on [address] and they had not received the RX for zolpidem (AMBIEN CR) 12.5 MG CR tablet. Pt would like it called in or faxed today if possible to [facility]. Thanks TNP

## 2015-03-19 NOTE — Progress Notes (Signed)
Patient ID: Veronica Lozano, female   DOB: 09-09-1965, 50 y.o.   MRN: [id number]       Patient: Veronica Lozano Female    DOB: 07-Jul-1965   50 y.o.   MRN: [id number] Visit Date: 03/19/2015  Today's Provider: Margaretann Loveless, PA-C   Chief Complaint  Patient presents with  . New Patient (Initial Visit)  . Diabetes   Subjective:    HPI  Diabetes Mellitus Type II, Follow-up:   Lab Results  Component Value Date   HGBA1C 12.5* 10/19/2014  HGBA1C                     11.3      01/15/2015 Dr. Jon Gills at [facility] in [place].  Last seen for diabetes 2 months ago.  Management since then includes no changes. She reports excellent compliance with treatment. She is not having side effects.  Current symptoms include hyperglycemia and paresthesia of the feet and have been stable. Home blood sugar records: fasting range: 260's  Episodes of hypoglycemia? no   Current Insulin Regimen:  Most Recent Eye Exam: 08/2014 [facility] Weight trend: increasing steadily Prior visit with dietician: no Current diet: in general, a "healthy" diet   Current exercise: none  Pertinent Labs:    Component Value Date/Time   CREATININE 0.54 02/09/2015 1414   CREATININE 0.60 06/23/2014 2329    Wt Readings from Last 3 Encounters:  03/19/15 147 lb (66.679 kg)  02/09/15 148 lb (67.132 kg)  10/21/14 150 lb 14.4 oz (68.448 kg)    ------------------------------------------------------------------------       Allergies  Allergen Reactions  . Coconut Oil Hives  . Latex Hives  . Morphine And Related Itching and Rash   Previous Medications   ACYCLOVIR (ZOVIRAX) 800 MG TABLET    Take 800 mg by mouth as needed.   ATORVASTATIN (LIPITOR) 20 MG TABLET    Take 20 mg by mouth daily.   INSULIN ASPART (NOVOLOG) 100 UNIT/ML INJECTION    Inject 19 Units into the skin 3 (three) times daily before meals. Sliding scale   INSULIN GLARGINE (LANTUS) 100 UNIT/ML INJECTION    Inject 25 Units into the skin at  bedtime.    OXYCODONE-ACETAMINOPHEN (ROXICET) 5-325 MG PER TABLET    Take 1 tablet by mouth every 6 (six) hours as needed for severe pain.   PANCRELIPASE, LIP-PROT-AMYL, (CREON) 24000 UNITS CPEP    Take 24,000 Units by mouth 3 (three) times daily with meals. 2 tablets with meals and 1 tablet with snacks   PROMETHAZINE (PHENERGAN) 25 MG TABLET    Take 25 mg by mouth every 8 (eight) hours as needed.    RANITIDINE (ZANTAC) 150 MG TABLET    Take 150 mg by mouth as needed.    ZOLPIDEM (AMBIEN) 10 MG TABLET    take 1 tablet by mouth at bedtime if needed for sleep    Review of Systems  Constitutional: Positive for appetite change.  HENT: Negative.   Respiratory: Negative.   Cardiovascular: Negative.   Gastrointestinal: Positive for nausea and abdominal pain.  Endocrine: Negative.   Musculoskeletal: Positive for back pain.    Social History  Substance Use Topics  . Smoking status: Current Every Day Smoker -- 0.50 packs/day    Types: Cigarettes  . Smokeless tobacco: Never Used  . Alcohol Use: No   Objective:   BP 112/70 mmHg  Pulse 98  Temp(Src) 98.9 F (37.2 C) (Oral)  Resp 20  Ht  (  1.499 m)  Wt 147 lb (66.679 kg)  BMI 29.67 kg/m2  SpO2 96%  Physical Exam  Constitutional: She is oriented to person, place, and time. She appears well-developed and well-nourished. No distress.  HENT:  Head: Normocephalic and atraumatic.  Right Ear: External ear normal.  Left Ear: External ear normal.  Nose: Nose normal.  Mouth/Throat: Oropharynx is clear and moist. No oropharyngeal exudate.  Eyes: Conjunctivae and EOM are normal. Pupils are equal, round, and reactive to light. Right eye exhibits no discharge. Left eye exhibits no discharge. No scleral icterus.  Neck: Normal range of motion. Neck supple. No JVD present. No tracheal deviation present. No thyromegaly present.  Cardiovascular: Normal rate, regular rhythm, normal heart sounds and intact distal pulses.  Exam reveals no gallop and  no friction rub.   No murmur heard. Pulmonary/Chest: Effort normal and breath sounds normal. No respiratory distress. She has no wheezes. She has no rales. She exhibits no tenderness.  Abdominal: Soft. Bowel sounds are normal. She exhibits no distension and no mass. There is no tenderness. There is no rebound and no guarding.  Musculoskeletal: Normal range of motion. She exhibits no edema or tenderness.  Lymphadenopathy:    She has no cervical adenopathy.  Neurological: She is alert and oriented to person, place, and time.  Skin: Skin is warm and dry. No rash noted. She is not diaphoretic.  Psychiatric: She has a normal mood and affect. Her behavior is normal. Judgment and thought content normal.  Vitals reviewed.       Assessment & Plan:     1. Insomnia She is been having breakthrough insomnia on Ambien 10 mg. She states that she will fall asleep well but then will wake up approximately 3 hours later sometimes it is even 1-2 hours later. She would then be able to fall asleep but keep 3 waking every couple of hours. I will change the immediate release Ambien to a controlled-release Ambien 12.5 mg. I will see her back in 4 weeks to see how she is doing with this change. - zolpidem (AMBIEN CR) 12.5 MG CR tablet; Take 1 tablet (12.5 mg total) by mouth at bedtime as needed for sleep.  Dispense: 30 tablet; Refill: 0  2. Type 1 diabetes mellitus with diabetic polyneuropathy (HCC) Hemoglobin A1c today in the office was greater than 14. She states that she has never had her hemoglobin A1c being this elevated before. She has recently been on prednisone and also been sick and feels that may have some thing to do with it. She does have neuropathy in her feet as well but she is followed by Dr. Graciela Husbands for this.She has been using 19 units of NovoLog before each meal on a sliding scale and was recently put on Lantus 25 units nightly. I will increase her Lantus to 35 units nightly. She is to keep her fasting  morning glucose readings and call me when she has approximately one week's worth. If fasting readings are still elevated I will increase the Lantus dose again prior to her return. I will be seeing her back in 4 weeks to evaluate how she is doing.  - POCT HgB A1C - insulin glargine (LANTUS) 100 UNIT/ML injection; Inject 0.35 mLs (35 Units total) into the skin at bedtime.  Dispense: 10 mL; Refill: 11  3. 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 She currently does not drink any alcohol .She has previously been followed by a [facility] in [place]. She  did have a recent change of her insurance over the new year and they're no longer in her network. She has been hospitalized for this many times this year with the most recent being 02/09/2015. She does report continuing to have epigastric and right-sided abdominal pain that radiates to the back every time she eats. She is currently on Creon and states that this controls her symptoms. I will go ahead and refer her to a gastroenterologist in her network so that she will have someone in case she has another flare. - Ambulatory referral to Gastroenterology       Margaretann Loveless, PA-C  [facility]

## 2015-03-21 ENCOUNTER — Other Ambulatory Visit: Payer: Self-pay | Admitting: Pain Medicine

## 2015-03-21 ENCOUNTER — Ambulatory Visit: Payer: BLUE CROSS/BLUE SHIELD | Attending: Pain Medicine | Admitting: Pain Medicine

## 2015-03-21 ENCOUNTER — Encounter: Payer: Self-pay | Admitting: Pain Medicine

## 2015-03-21 VITALS — BP 121/50 | HR 105 | Temp 98.2°F | Resp 16 | Ht 59.0 in | Wt 146.0 lb

## 2015-03-21 DIAGNOSIS — M549 Dorsalgia, unspecified: Secondary | ICD-10-CM | POA: Diagnosis present

## 2015-03-21 DIAGNOSIS — F1190 Opioid use, unspecified, uncomplicated: Secondary | ICD-10-CM | POA: Insufficient documentation

## 2015-03-21 DIAGNOSIS — E109 Type 1 diabetes mellitus without complications: Secondary | ICD-10-CM | POA: Diagnosis not present

## 2015-03-21 DIAGNOSIS — Z0189 Encounter for other specified special examinations: Secondary | ICD-10-CM | POA: Insufficient documentation

## 2015-03-21 DIAGNOSIS — Z79891 Long term (current) use of opiate analgesic: Secondary | ICD-10-CM | POA: Insufficient documentation

## 2015-03-21 DIAGNOSIS — K859 Acute pancreatitis without necrosis or infection, unspecified: Secondary | ICD-10-CM | POA: Diagnosis not present

## 2015-03-21 DIAGNOSIS — G8929 Other chronic pain: Secondary | ICD-10-CM | POA: Diagnosis not present

## 2015-03-21 DIAGNOSIS — K861 Other chronic pancreatitis: Secondary | ICD-10-CM | POA: Diagnosis not present

## 2015-03-21 DIAGNOSIS — G4700 Insomnia, unspecified: Secondary | ICD-10-CM | POA: Insufficient documentation

## 2015-03-21 DIAGNOSIS — I10 Essential (primary) hypertension: Secondary | ICD-10-CM | POA: Diagnosis not present

## 2015-03-21 DIAGNOSIS — R1013 Epigastric pain: Secondary | ICD-10-CM | POA: Diagnosis not present

## 2015-03-21 DIAGNOSIS — R52 Pain, unspecified: Secondary | ICD-10-CM | POA: Insufficient documentation

## 2015-03-21 DIAGNOSIS — Z5181 Encounter for therapeutic drug level monitoring: Secondary | ICD-10-CM | POA: Insufficient documentation

## 2015-03-21 DIAGNOSIS — F17210 Nicotine dependence, cigarettes, uncomplicated: Secondary | ICD-10-CM | POA: Diagnosis not present

## 2015-03-21 NOTE — Assessment & Plan Note (Signed)
Initial pain management evaluation at [facility] (03/21/2015) with Dr. Laban Emperor. The initial visit is an evaluation only. The patient was informed of the medication management program as well as interventional management program. She was provided with written information with regards to both. Lab work was ordered, information collected, and a referral to a medical psychologist was arranged for substance use disorder evaluation and wrist stratification.

## 2015-03-21 NOTE — Progress Notes (Signed)
Patient's Name: Veronica Lozano MRN: [id number] DOB: 1965-07-28 DOS: 03/21/2015  Primary Reason(s) for Visit: Initial Patient Evaluation CC: Pancreatitis; Back Pain; and Generalized Body Aches   HPI  Veronica Lozano is a 50 y.o. year old, female patient, who comes today for an initial evaluation. She has Intractable abdominal pain; Insomnia, persistent; Blood pressure elevated; Personal history of other infectious and parasitic diseases; Chronic recurrent pancreatitis (HCC); Type 1 diabetes mellitus (HCC); Long term current use of opiate analgesic; Long term prescription opiate use; Opiate use; Encounter for therapeutic drug level monitoring; Chronic abdominal pain (epigastric) (Location of Primary Source of Pain); Encounter for pain management planning; Chronic pain; and Pain management on her problem list.. Her primarily concern today is the Pancreatitis; Back Pain; and Generalized Body Aches   The patient comes in today clinics today for the first time for evaluation and possible treatment of her chronic abdominal pain. She describes this pain as being in the epigastric region from one side to another and also be in the back. This is typically the distribution of pain from pancreatitis however, there is always a possibility that some of this may also be secondary to a thoracic radiculitis. Today I will be reviewing her charts to see if by any chance there is anything about problems in the thoracic region. Since there is some more solid documentation with arrest will produce that she has had with her pancreas, it is more likely than not that this is words coming from. It also worsens with food consumption, which wouldn't be the case and a radiculitis.  Today's Pain Score: 7  (last pain medication @ 0430), clinically she looks like a 3-4/10. Reported level of pain is incompatible with clinical obrservations. This may be secondary to a possible lack of understanding on how the pain scale works. Pain  Type: Chronic pain Pain Location: Abdomen Pain Orientation: Right Pain Descriptors / Indicators: Constant, Stabbing Pain Frequency: Constant  Onset and Duration: Sudden and Date of onset: The first episode of pancreatitis was recorded on 2013. She has been experiencing chronic constant pain since 2014. Cause of pain: Chronic recurrent pancreatitis Severity: Getting worse, NAS-11 at its worse: 10/10, NAS-11 at its best: 5/10, NAS-11 now: 7/10 and NAS-11 on the average: 7/10 Timing: Not influenced by the time of the day Aggravating Factors: Eating Alleviating Factors: Medications and Warm showers or baths Associated Problems: Nausea, Vomiting , Pain that wakes patient up and Pain that does not allow patient to sleep Quality of Pain: Deep, Disabling, Dreadful, Dull, Getting longer, Horrible, Sharp and Stabbing Previous Examinations or Tests: CT scan, Endoscopy, MRI scan and X-rays Previous Treatments: Narcotic medications  Pharmacotherapy Review  Previously Prescribed Opioids: The patient currently takes Percocet 5/325 one tablet by mouth every 6 hours when necessary for the pain. This is not being prescribed by our practice. Side-effects or Adverse reactions: None reported Effectiveness: Described as ineffective and would like to make some changes Onset of action: Within expected pharmacological parameters Duration of action: Within normal limits for medication Peak effect: Timing and results are as within normal expected parameters [place] PMP: Reviewed. The report indicates that she has been given prescriptions to take 4-6 tablets per day. The report does not reflect that pattern of abuse. This urged conducted went back to 09/21/2008. Hospital associated UDS Results: No results found for: THCU, COCAINSCRNUR, PCPSCRNUR, MDMA, AMPHETMU, METHADONE, ETOH UDS Results: No UDS available, at this time UDS Interpretation: No UDS available, at this time Medication Assessment Form: Not applicable.  Initial evaluation. The patient has not received any medications from our practice Treatment compliance: Not applicable. Initial evaluation Substance Use Disorder (SUD) Risk Level: Pending results of Medical Psychology Evaluation for SUD Pharmacologic Plan: Pending ordered tests and/or consults  Allergies  Veronica Lozano is allergic to coconut oil; latex; and morphine and related.  Meds  The patient has a current medication list which includes the following prescription(s): acyclovir, atorvastatin, insulin aspart, insulin glargine, oxycodone-acetaminophen, pancrelipase (lip-prot-amyl), promethazine, ranitidine, and zolpidem. Requested Prescriptions    No prescriptions requested or ordered in this encounter    ROS  Cardiovascular History: Heart trouble and Heart surgery Pulmonary or Respiratory History: Smoker and Snoring  Neurological History: Negative for epilepsy, stroke, urinary or fecal inontinence, spina bifida or tethered cord syndrome Psychological-Psychiatric History: Negative for anxiety, depression, schizophrenia, bipolar disorders or suicidal ideations or attempts Gastrointestinal History: Pancreatitis Genitourinary History: Negative for nephrolithiasis, hematuria, renal failure or chronic kidney disease Hematological History: Negative for anticoagulant therapy, anemia, bruising or bleeding easily, hemophilia, sickle cell disease or trait, thrombocytopenia or coagulupathies Endocrine History: Insulin-dependent diabetes mellitus Rheumatologic History: Negative for lupus, osteoarthritis, rheumatoid arthritis, myositis, polymyositis or fibromyagia Musculoskeletal History: Negative for myasthenia gravis, muscular dystrophy, multiple sclerosis or malignant hyperthermia Work History: Out of work due to pain  PFSH  Medical:  Veronica Lozano  has a past medical history of Diabetes mellitus without complication (HCC); Pancreatitis; and Hypertension. Family: family history includes  Cancer in her father and mother; Diabetes in her brother. Surgical:  has past surgical history that includes Cholecystectomy; Appendectomy; Abdominal hysterectomy; Cardiac catheterization; and Foot surgery (Bilateral). Tobacco:  reports that she has been smoking Cigarettes.  She has been smoking about 0.50 packs per day. She has never used smokeless tobacco. Alcohol:  reports that she does not drink alcohol. Drug:  reports that she does not use illicit drugs.  Physical Exam  Vitals:  Today's Vitals   03/21/15 1311  BP: 121/50  Pulse: 105  Temp: 98.2 F (36.8 C)  TempSrc: Oral  Resp: 16  Height:  (1.499 m)  Weight: 146 lb (66.225 kg)  SpO2: 98%  PainSc: 7     Calculated BMI: Body mass index is 29.47 kg/(m^2).  General appearance: alert, cooperative, appears stated age, mild distress and moderately obese Eyes: PERLA Respiratory: No evidence respiratory distress, no audible rales or ronchi and no use of accessory muscles of respiration  Cervical Spine Inspection: Normal anatomy Alignment: Symetrical ROM: Adequate Palpation: WNL Provocative Tests: Spurling's Foraminal Stenosis Test: deferred Hoffman's Cervical Myelopathy Test: deferred Lhermitte Sign (Cord Compression/Myelopathy Test):  deferred  Upper Extremities Inspection: No gross anomalies detected ROM: Adequate Sensory: Normal Motor: Unremarkable Pulses: Palpable DTR:  Biceps (C5): WNL Brachioradialis (C6): WNL Triceps (C7): WNL  Thoracic Spine Inspection: No gross anomalies detected Alignment: Symetrical ROM: Adequate Palpation: WNL  Lumbar Spine Inspection: No gross anomalies detected Alignment: Symetrical ROM: Adequate Palpation: WNL Provocative Tests: Lumbar Hyperextension and rotation test: deferred Patrick's Maneuver: deferred Gait: WNL  Lower Extremities Inspection: No gross anomalies detected ROM: Adequate Sensory: Normal Motor: Unremarkable  Toe walk (S1): WNL  Heal walk (L5):  WNL Pulses: Palpable DTR:  Patellar (L4): WNL Achilles (S1): WNL  Assessment  Primary Diagnosis & Pertinent Problem List: The primary encounter diagnosis was Long term current use of opiate analgesic. Diagnoses of Chronic abdominal pain (epigastric) (Location of Primary Source of Pain), Chronic biliary pancreatitis (HCC), Encounter for pain management planning, and Chronic pain were also pertinent to this visit.  Visit Diagnosis: 1. Long  term current use of opiate analgesic   2. Chronic abdominal pain (epigastric) (Location of Primary Source of Pain)   3. Chronic biliary pancreatitis (HCC)   4. Encounter for pain management planning   5. Chronic pain     Assessment: Encounter for pain management planning Initial pain management evaluation at [facility] (03/21/2015) with Dr. Laban Emperor. The initial visit is an evaluation only. The patient was informed of the medication management program as well as interventional management program. She was provided with written information with regards to both. Lab work was ordered, information collected, and a referral to a medical psychologist was arranged for substance use disorder evaluation and wrist stratification.  Chronic abdominal pain (epigastric) (Location of Primary Source of Pain) Up until now this has been managed with medications, mostly Creon, Phenergan, and Percocet. No interventional pain management recorded.   Abdominal MR w/wo Cm/MRCP (10/20/2014) IMPRESSION: There is edema adjacent to the pancreatic tail along with mildly increased T2 signal, increased diffusion-weighted signal, and arterial phase hypo enhancement in the pancreatic tail. These are findings which could overlap between focal pancreatitis and pancreatic malignancy. Reviewing the patient's recent cross-sectional imaging, there were inflammatory findings around the pancreatic head on 06/24/2014 (but the pancreatic tail appeared normal), and  the entire pancreas appeared normal on 05/29/2014 (but the patient had ascending colitis). On the CT from 10/17/2014, the appearance is as it is today, with mild focal hypoenhancement in the pancreatic tail and some adjacent inflammatory stranding. No pancreas divisum. It can be problematic to differentiate focal pancreatitis from pancreatic mass, but the fact that the pancreatic tail appeared normal 4 months ago seems to argue against a pancreatic tail malignancy. The patient's symptoms and the surrounding edema also suggest pancreatic inflammation. Short of a biopsy, there is not an imaging mechanism to provide certainty in this differential diagnosis at this time. I would recommend treating as focal pancreatitis, with a very low threshold for reimaging by CT or MRI to confirm complete resolution. -MR report electronically signed by: Gaylyn Rong, M.D. (on: 10/20/2014 15:22)  Plan of Care  Note: As per protocol, today's visit has been an evaluation only. We have not taken over the patient's controlled substance management.  Pharmacotherapy (Medications Ordered): No orders of the defined types were placed in this encounter.    Lab-work & Procedure Ordered: Orders Placed This Encounter  Procedures  . Drugs of abuse screen w/o alc, rtn urine-sln    Volume: 10 ml(s). Minimum 3 ml of urine is needed. Document temperature of fresh sample. Indications: Long term (current) use of opiate analgesic (Z79.891) Test#: [id number] (Tox[employer]ure Select-13)  . Comprehensive metabolic panel    Order Specific Question:  Has the patient fasted?    Answer:  No  . C-reactive protein  . Magnesium  . Sedimentation rate  . Vitamin B12    Indication: Bone Pain (M89.9)  . Vitamin D pnl(25-hydrxy+1,25-dihy)-bld  . Ambulatory referral to Psychology    Referral Priority:  Routine    Referral Type:  Psychiatric    Referral Reason:  Specialty Services Required    Referred to Provider:  Lance Coon, PHD     Requested Specialty:  Psychology    Number of Visits Requested:  1    Imaging Ordered: AMB REFERRAL TO PSYCHOLOGY  Interventional Therapies: Scheduled: None at this point. PRN Procedures: Diagnostic bilateral celiac plexus block under fluoroscopic guidance and IV sedation.   Referral(s) or Consult(s): Medical psychology evaluation for substance use disorder and risk  stratification.  Medications administered during this visit: Veronica Lozano does not currently have medications on file.  Future Appointments Date Time Provider Department Center  04/16/2015 9:45 AM Margaretann Loveless, PA-C [facility] None  04/19/2015 3:15 PM Midge Minium, MD [facility] None    Primary Care Physician: Margaretann Loveless, PA-C Location: [facility] Note by: Sydnee Levans. Laban Emperor, M.D, DABA, DABAPM, DABPM, DABIPP, FIPP

## 2015-03-21 NOTE — Assessment & Plan Note (Signed)
Up until now this has been managed with medications, mostly Creon, Phenergan, and Percocet. No interventional pain management recorded.

## 2015-03-21 NOTE — Progress Notes (Signed)
New patient here today for treatment of pain associated to chronic pancreatitis.  Patient is diabetic and a current 1/2 pack a day smoker.  Would like pain medications prescribed.

## 2015-03-24 ENCOUNTER — Emergency Department: Payer: BLUE CROSS/BLUE SHIELD

## 2015-03-24 ENCOUNTER — Emergency Department
Admission: EM | Admit: 2015-03-24 | Discharge: 2015-03-24 | Disposition: A | Discharge status: Home / Self Care | Payer: BLUE CROSS/BLUE SHIELD | Attending: Emergency Medicine | Admitting: Emergency Medicine

## 2015-03-24 ENCOUNTER — Other Ambulatory Visit: Payer: Self-pay

## 2015-03-24 DIAGNOSIS — G8929 Other chronic pain: Secondary | ICD-10-CM | POA: Diagnosis not present

## 2015-03-24 DIAGNOSIS — F17210 Nicotine dependence, cigarettes, uncomplicated: Secondary | ICD-10-CM | POA: Insufficient documentation

## 2015-03-24 DIAGNOSIS — R1013 Epigastric pain: Secondary | ICD-10-CM | POA: Insufficient documentation

## 2015-03-24 DIAGNOSIS — Z8719 Personal history of other diseases of the digestive system: Secondary | ICD-10-CM | POA: Diagnosis not present

## 2015-03-24 DIAGNOSIS — Z794 Long term (current) use of insulin: Secondary | ICD-10-CM | POA: Diagnosis not present

## 2015-03-24 DIAGNOSIS — R1011 Right upper quadrant pain: Secondary | ICD-10-CM | POA: Diagnosis present

## 2015-03-24 DIAGNOSIS — E109 Type 1 diabetes mellitus without complications: Secondary | ICD-10-CM | POA: Insufficient documentation

## 2015-03-24 DIAGNOSIS — I10 Essential (primary) hypertension: Secondary | ICD-10-CM | POA: Insufficient documentation

## 2015-03-24 DIAGNOSIS — Z79899 Other long term (current) drug therapy: Secondary | ICD-10-CM | POA: Diagnosis not present

## 2015-03-24 DIAGNOSIS — Z91040 Latex allergy status: Secondary | ICD-10-CM | POA: Insufficient documentation

## 2015-03-24 LAB — COMPREHENSIVE METABOLIC PANEL
ALK PHOS: 82 U/L (ref 38–126)
ALT: 17 U/L (ref 14–54)
AST: 17 U/L (ref 15–41)
Albumin: 4 g/dL (ref 3.5–5.0)
Anion gap: 8 (ref 5–15)
BILIRUBIN TOTAL: 1 mg/dL (ref 0.3–1.2)
BUN: 7 mg/dL (ref 6–20)
CALCIUM: 9.1 mg/dL (ref 8.9–10.3)
CO2: 26 mmol/L (ref 22–32)
Chloride: 101 mmol/L (ref 101–111)
Creatinine, Ser: 0.36 mg/dL — ABNORMAL LOW (ref 0.44–1.00)
GLUCOSE: 220 mg/dL — AB (ref 65–99)
Potassium: 4.2 mmol/L (ref 3.5–5.1)
Sodium: 135 mmol/L (ref 135–145)
TOTAL PROTEIN: 7.4 g/dL (ref 6.5–8.1)

## 2015-03-24 LAB — URINALYSIS COMPLETE WITH MICROSCOPIC (ARMC ONLY)
Bilirubin Urine: NEGATIVE
Glucose, UA: 50 mg/dL — AB
KETONES UR: NEGATIVE mg/dL
Leukocytes, UA: NEGATIVE
Nitrite: NEGATIVE
PH: 5 (ref 5.0–8.0)
PROTEIN: NEGATIVE mg/dL
Specific Gravity, Urine: 1.008 (ref 1.005–1.030)

## 2015-03-24 LAB — LIPASE, BLOOD: Lipase: 31 U/L (ref 11–51)

## 2015-03-24 LAB — CBC
HCT: 47.1 % — ABNORMAL HIGH (ref 35.0–47.0)
HEMOGLOBIN: 16.1 g/dL — AB (ref 12.0–16.0)
MCH: 30.8 pg (ref 26.0–34.0)
MCHC: 34.2 g/dL (ref 32.0–36.0)
MCV: 90.1 fL (ref 80.0–100.0)
Platelets: 198 10*3/uL (ref 150–440)
RBC: 5.23 MIL/uL — AB (ref 3.80–5.20)
RDW: 12.6 % (ref 11.5–14.5)
WBC: 7.6 10*3/uL (ref 3.6–11.0)

## 2015-03-24 LAB — TROPONIN I

## 2015-03-24 MED ORDER — METOCLOPRAMIDE HCL 5 MG/ML IJ SOLN
10.0000 mg | Freq: Once | INTRAMUSCULAR | Status: AC
  Administered 2015-03-24: 10 mg via INTRAVENOUS
  Filled 2015-03-24: qty 2

## 2015-03-24 MED ORDER — METOCLOPRAMIDE HCL 10 MG PO TABS: 10.0000 mg | ORAL_TABLET | Freq: Three times a day (TID) | ORAL | Status: DC | PRN

## 2015-03-24 MED ORDER — SODIUM CHLORIDE 0.9 % IV BOLUS (SEPSIS)
500.0000 mL | Freq: Once | INTRAVENOUS | Status: AC
  Administered 2015-03-24: 500 mL via INTRAVENOUS

## 2015-03-24 MED ORDER — TRAMADOL HCL 50 MG PO TABS: 50.0000 mg | ORAL_TABLET | Freq: Four times a day (QID) | ORAL | Status: DC | PRN

## 2015-03-24 MED ORDER — HYDROMORPHONE HCL 1 MG/ML IJ SOLN
1.0000 mg | Freq: Once | INTRAMUSCULAR | Status: AC
  Administered 2015-03-24: 1 mg via INTRAVENOUS
  Filled 2015-03-24: qty 1

## 2015-03-24 MED ORDER — PANTOPRAZOLE SODIUM 20 MG PO TBEC: 20.0000 mg | DELAYED_RELEASE_TABLET | Freq: Every day | ORAL | Status: DC

## 2015-03-24 MED ORDER — HYDROMORPHONE HCL 2 MG PO TABS
2.0000 mg | ORAL_TABLET | Freq: Once | ORAL | Status: AC
Start: 2015-03-24 — End: 2015-03-24
  Administered 2015-03-24: 2 mg via ORAL
  Filled 2015-03-24: qty 1

## 2015-03-24 NOTE — ED Notes (Signed)
Pt reports chest pain , upper abd pain and middle back pain for past week that comes and goes. Unknown fever reports chills and sweats. Multiple episodes of vomiting no diarrhea.

## 2015-03-24 NOTE — Discharge Instructions (Signed)
Abdominal Pain, Adult Many things can cause abdominal pain. Usually, abdominal pain is not caused by a disease and will improve without treatment. It can often be observed and treated at home. Your health care provider will do a physical exam and possibly order blood tests and X-rays to help determine the seriousness of your pain. However, in many cases, more time must pass before a clear cause of the pain can be found. Before that point, your health care provider may not know if you need more testing or further treatment. HOME CARE INSTRUCTIONS Monitor your abdominal pain for any changes. The following actions may help to alleviate any discomfort you are experiencing:  Only take over-the-counter or prescription medicines as directed by your health care provider.  Do not take laxatives unless directed to do so by your health care provider.  Try a clear liquid diet (broth, tea, or water) as directed by your health care provider. Slowly move to a bland diet as tolerated. SEEK MEDICAL CARE IF:  You have unexplained abdominal pain.  You have abdominal pain associated with nausea or diarrhea.  You have pain when you urinate or have a bowel movement.  You experience abdominal pain that wakes you in the night.  You have abdominal pain that is worsened or improved by eating food.  You have abdominal pain that is worsened with eating fatty foods.  You have a fever. SEEK IMMEDIATE MEDICAL CARE IF:  Your pain does not go away within 2 hours.  You keep throwing up (vomiting).  Your pain is felt only in portions of the abdomen, such as the right side or the left lower portion of the abdomen.  You pass bloody or black tarry stools. MAKE SURE YOU:  Understand these instructions.  Will watch your condition.  Will get help right away if you are not doing well or get worse.   This information is not intended to replace advice given to you by your health care provider. Make sure you discuss  any questions you have with your health care provider.   Document Released: 11/20/2004 Document Revised: 11/01/2014 Document Reviewed: 10/20/2012 Elsevier Interactive Patient Education [employer]  Please return immediately if condition worsens. Please contact her primary physician or the physician you were given for referral. If you have any specialist physicians involved in her treatment and plan please also contact them. Thank you for using [facility]. Return to emergency department especially for lower abdominal pain, uncontrolled vomiting, bloody emesis, or bloody stool.

## 2015-03-24 NOTE — ED Notes (Signed)
Reviewed d/c instructions, prescriptions, and follow-up care with patient. Pt verbalized understanding.  

## 2015-03-24 NOTE — ED Provider Notes (Signed)
Time Seen: Approximately ----------------------------------------- 3:01 PM on 03/24/2015 -----------------------------------------   I have reviewed the triage notes  Chief Complaint: Chest Pain and Abdominal Pain   History of Present Illness: Veronica Lozano is a 50 y.o. female who presents with upper middle quadrant abdominal pain with some radiation to the right and to the middle of her back. Patient states she's had this pain for the past week. The pain seems to be typical of her chronic pancreatitis. Patient was recently at the pain clinic on the 25th and no chills but she has not upcoming appointment with a psychiatrist and plans for a possible celiac block. She states the pain and its location and characteristic does not seem to be a typical of her normal pancreatic pain except as little bit more toward the right upper quadrant. Patient denies any fever. She has had some persistent nausea and has vomited multiple times with no blood or bile. She describes as a yellow acidemia emesis. She states she's had normal bowel movements with no melena or hematochezia she denies any difficulties with urination.   Past Medical History  Diagnosis Date  . Diabetes mellitus without complication (HCC)   . Pancreatitis   . Hypertension     Patient Active Problem List   Diagnosis Date Noted  . Long term current use of opiate analgesic 03/21/2015  . Long term prescription opiate use 03/21/2015  . Opiate use 03/21/2015  . Encounter for therapeutic drug level monitoring 03/21/2015  . Chronic abdominal pain (epigastric) (Location of Primary Source of Pain) 03/21/2015  . Encounter for pain management planning 03/21/2015  . Chronic pain 03/21/2015  . Pain management 03/21/2015  . Blood pressure elevated 03/19/2015  . Chronic recurrent pancreatitis (HCC) 03/19/2015  . Type 1 diabetes mellitus (HCC) 03/19/2015  . Insomnia, persistent 10/30/2014  . Personal history of other infectious and  parasitic diseases 10/25/2014  . Intractable abdominal pain 10/20/2014    Past Surgical History  Procedure Laterality Date  . Cholecystectomy    . Appendectomy    . Abdominal hysterectomy    . Cardiac catheterization    . Foot surgery Bilateral     for neuropathy    Past Surgical History  Procedure Laterality Date  . Cholecystectomy    . Appendectomy    . Abdominal hysterectomy    . Cardiac catheterization    . Foot surgery Bilateral     for neuropathy    Current Outpatient Rx  Name  Route  Sig  Dispense  Refill  . acyclovir (ZOVIRAX) 800 MG tablet   Oral   Take 800 mg by mouth as needed.         Marland Kitchen atorvastatin (LIPITOR) 20 MG tablet   Oral   Take 20 mg by mouth daily.      0   . insulin aspart (NOVOLOG) 100 UNIT/ML injection   Subcutaneous   Inject 19 Units into the skin 3 (three) times daily before meals. Sliding scale         . insulin glargine (LANTUS) 100 UNIT/ML injection   Subcutaneous   Inject 0.35 mLs (35 Units total) into the skin at bedtime.   10 mL   11   . oxyCODONE-acetaminophen (ROXICET) 5-325 MG per tablet   Oral   Take 1 tablet by mouth every 6 (six) hours as needed for severe pain. Patient taking differently: Take 1 tablet by mouth every 8 (eight) hours as needed for moderate pain or severe pain.    12 tablet  0   . Pancrelipase, Lip-Prot-Amyl, (CREON) 24000 UNITS CPEP   Oral   Take 24,000 Units by mouth 3 (three) times daily with meals. 2 tablets with meals and 1 tablet with snacks         . ranitidine (ZANTAC) 150 MG tablet   Oral   Take 150 mg by mouth as needed.          . zolpidem (AMBIEN CR) 12.5 MG CR tablet   Oral   Take 1 tablet (12.5 mg total) by mouth at bedtime as needed for sleep.   30 tablet   0     Allergies:  Coconut oil; Latex; and Morphine and related  Family History: Family History  Problem Relation Age of Onset  . Cancer Father   . Cancer Mother     lung  . Diabetes Brother     Social  History: Social History  Substance Use Topics  . Smoking status: Current Every Day Smoker -- 0.50 packs/day    Types: Cigarettes  . Smokeless tobacco: Never Used  . Alcohol Use: No     Review of Systems:   10 point review of systems was performed and was otherwise negative:  Constitutional: No fever Eyes: No visual disturbances ENT: No sore throat, ear pain Cardiac: No chest pain Respiratory: No shortness of breath, wheezing, or stridor Abdomen: Pain is mostly epigastric toward the right upper quadrant,  Endocrine: No weight loss, No night sweats Extremities: No peripheral edema, cyanosis Skin: No rashes, easy bruising Neurologic: No focal weakness, trouble with speech or swollowing Urologic: No dysuria, Hematuria, or urinary frequency   Physical Exam:  ED Triage Vitals  Enc Vitals Group     BP 03/24/15 1350 137/88 mmHg     Pulse Rate 03/24/15 1350 106     Resp 03/24/15 1350 18     Temp 03/24/15 1350 98.3 F (36.8 C)     Temp Source 03/24/15 1350 Oral     SpO2 03/24/15 1350 97 %     Weight 03/24/15 1350 140 lb (63.504 kg)     Height 03/24/15 1350  (1.499 m)     Head Cir --      Peak Flow --      Pain Score 03/24/15 1352 9     Pain Loc --      Pain Edu? --      Excl. in GC? --     General: Awake , Alert , and Oriented times 3; GCS 15 Head: Normal cephalic , atraumatic Eyes: Pupils equal , round, reactive to light Nose/Throat: No nasal drainage, patent upper airway without erythema or exudate.  Neck: Supple, Full range of motion, No anterior adenopathy or palpable thyroid masses Lungs: Clear to ascultation without wheezes , rhonchi, or rales Heart: Regular rate, regular rhythm without murmurs , gallops , or rubs Abdomen: Soft, non tender without rebound, guarding , or rigidity; bowel sounds positive and symmetric in all 4 quadrants. No organomegaly .        Extremities: 2 plus symmetric pulses. No edema, clubbing or cyanosis Neurologic: normal ambulation,  Motor symmetric without deficits, sensory intact Skin: warm, dry, no rashes   Labs:   All laboratory work was reviewed including any pertinent negatives or positives listed below:  Labs Reviewed  COMPREHENSIVE METABOLIC PANEL - Abnormal; Notable for the following:    Glucose, Bld 220 (*)    Creatinine, Ser 0.36 (*)    All other components within normal limits  CBC -  Abnormal; Notable for the following:    RBC 5.23 (*)    Hemoglobin 16.1 (*)    HCT 47.1 (*)    All other components within normal limits  URINALYSIS COMPLETEWITH MICROSCOPIC ([facility] ONLY) - Abnormal; Notable for the following:    Color, Urine YELLOW (*)    APPearance CLEAR (*)    Glucose, UA 50 (*)    Hgb urine dipstick 1+ (*)    Bacteria, UA RARE (*)    Squamous Epithelial / LPF 0-5 (*)    All other components within normal limits  LIPASE, BLOOD  TROPONIN I   review laboratory work shows no significant abnormalities. Mild hyperglycemia.  EKG: ED ECG REPORT I, Jennye Moccasin, the attending physician, personally viewed and interpreted this ECG.  Date: 03/24/2015 EKG Time: 1348 Rate: 103 Rhythm: normal sinus rhythm QRS Axis: normal Intervals: normal ST/T Wave abnormalities: normal Conduction Disturbances: none Narrative Interpretation: unremarkable Left atrial enlargement with poor R-wave progression seen mostly in the septal leads. No new ischemic changes   Radiology:     I personally reviewed the radiologic studies  EXAM: CHEST 2 VIEW  COMPARISON: 02/09/2015  FINDINGS: The heart size and mediastinal contours are within normal limits. Both lungs are clear. The visualized skeletal structures are unremarkable.  IMPRESSION: No active cardiopulmonary disease.   Electronically Signed      ED Course:  Patient's stay here showed some improvement after IV Dilaudid, IV Zofran, etc. The patient was cautioned that now she is followed by the pain clinic that we have to be hesitant about  prescribing narcotic pain medication. She should follow up with the pain clinic on Monday and advised them of the exacerbation of her chronic discomfort that she has in her abdomen. The patient's differential is noted based on her previous surgical history, etc. I felt this was unlikely to be an acute surgical issue or a bowel obstruction, etc. She states her nausea and vomiting is better after the IV Reglan here administered in the emergency department. Patient's was discharged with prescription for Ultram and advised to take over-the-counter pain medications as needed. I also placed her on a prescription for Reglan and proton next.    Assessment:  Acute exacerbation of chronic abdominal pain History of pancreatitis*      Plan:   outpatient management Patient was advised to return immediately if condition worsens. Patient was advised to follow up with their primary care physician or other specialized physicians involved in their outpatient care            Jennye Moccasin, MD 03/24/15 1851

## 2015-03-28 ENCOUNTER — Ambulatory Visit (INDEPENDENT_AMBULATORY_CARE_PROVIDER_SITE_OTHER): Payer: BLUE CROSS/BLUE SHIELD | Admitting: Physician Assistant

## 2015-03-28 ENCOUNTER — Encounter: Payer: Self-pay | Admitting: Physician Assistant

## 2015-03-28 VITALS — BP 118/76 | HR 84 | Temp 98.8°F | Resp 16 | Wt 145.2 lb

## 2015-03-28 DIAGNOSIS — R110 Nausea: Secondary | ICD-10-CM | POA: Diagnosis not present

## 2015-03-28 DIAGNOSIS — R1011 Right upper quadrant pain: Secondary | ICD-10-CM

## 2015-03-28 DIAGNOSIS — R109 Unspecified abdominal pain: Secondary | ICD-10-CM

## 2015-03-28 DIAGNOSIS — R319 Hematuria, unspecified: Secondary | ICD-10-CM | POA: Diagnosis not present

## 2015-03-28 DIAGNOSIS — R10A1 Flank pain, right side: Secondary | ICD-10-CM

## 2015-03-28 DIAGNOSIS — R10A0 Flank pain, unspecified side: Secondary | ICD-10-CM

## 2015-03-28 LAB — POCT URINALYSIS DIPSTICK
Bilirubin, UA: NEGATIVE
LEUKOCYTES UA: NEGATIVE
NITRITE UA: NEGATIVE
PH UA: 5
Spec Grav, UA: >=1.03
UROBILINOGEN UA: 0.2

## 2015-03-28 MED ORDER — OXYCODONE-ACETAMINOPHEN 5-325 MG PO TABS: 1.0000 | ORAL_TABLET | Freq: Three times a day (TID) | ORAL | Status: DC | PRN

## 2015-03-28 NOTE — Progress Notes (Signed)
Patient ID: Veronica Lozano, female   DOB: 1965/08/19, 50 y.o.   MRN: [id number]   Patient: Veronica Lozano Female    DOB: August 04, 1965   50 y.o.   MRN: [id number] Visit Date: 03/28/2015  Today's Provider: Margaretann Loveless, PA-C   Chief Complaint  Patient presents with  . Flank Pain   Subjective:    Flank Pain This is a new problem. The current episode started in the past 7 days. The problem occurs intermittently. The problem has been gradually worsening since onset. Pain location: right flank. The quality of the pain is described as aching. Radiates to: right groin. The pain is at a severity of 6/10 (waxing and waning). The pain is moderate. Associated symptoms include abdominal pain and dysuria (occasional). Pertinent negatives include no bladder incontinence, bowel incontinence, chest pain, fever, headaches, leg pain, numbness, paresis, paresthesias, pelvic pain, perianal numbness, tingling, weakness or weight loss. Risk factors include lack of exercise. She has tried heat, analgesics and bed rest (tramadol) for the symptoms. The treatment provided mild relief.  Was seen on Saturday at the ER at [facility] for a chronic pancreatitis flare. She states that this has improved and she does not have the epigastric pain that was radiating through to the back any longer. She did have the right sided flank pain at that time as well that radiated across to the right upper quadrant but states that she felt that it was secondary to the pancreatitis. When the pancreatitis symptoms improved this pain continued and is now radiating to the right groin. She has noticed a darkening of her urine. She states that her urine will darken and then clear. She does state that the pain is waxing and waning as well. She has never had a kidney stone. She also mentions that this pain is different than her pancreatitis pain.    Previous Medications   ACYCLOVIR (ZOVIRAX) 800 MG TABLET    Take 800 mg by mouth as needed.   ATORVASTATIN (LIPITOR) 20 MG TABLET    Take 20 mg by mouth daily.   INSULIN ASPART (NOVOLOG) 100 UNIT/ML INJECTION    Inject 19 Units into the skin 3 (three) times daily before meals. Sliding scale   INSULIN GLARGINE (LANTUS) 100 UNIT/ML INJECTION    Inject 0.35 mLs (35 Units total) into the skin at bedtime.   METOCLOPRAMIDE (REGLAN) 10 MG TABLET    Take 1 tablet (10 mg total) by mouth every 8 (eight) hours as needed for nausea.   OXYCODONE-ACETAMINOPHEN (ROXICET) 5-325 MG PER TABLET    Take 1 tablet by mouth every 6 (six) hours as needed for severe pain.   PANCRELIPASE, LIP-PROT-AMYL, (CREON) 24000 UNITS CPEP    Take 24,000 Units by mouth 3 (three) times daily with meals. 2 tablets with meals and 1 tablet with snacks   RANITIDINE (ZANTAC) 150 MG TABLET    Take 150 mg by mouth as needed.    TRAMADOL (ULTRAM) 50 MG TABLET    Take 1 tablet (50 mg total) by mouth every 6 (six) hours as needed.   ZOLPIDEM (AMBIEN CR) 12.5 MG CR TABLET    Take 1 tablet (12.5 mg total) by mouth at bedtime as needed for sleep.   Allergies  Allergen Reactions  . Coconut Oil Hives  . Latex Hives  . Morphine And Related Itching and Rash     Review of Systems  Constitutional: Negative.  Negative for fever and weight loss.  HENT: Negative.   Eyes: Negative.  Respiratory: Negative.   Cardiovascular: Negative.  Negative for chest pain.  Gastrointestinal: Positive for abdominal pain. Negative for bowel incontinence.  Endocrine: Negative.   Genitourinary: Positive for dysuria (occasional), frequency and flank pain. Negative for bladder incontinence, urgency, decreased urine volume and pelvic pain.  Musculoskeletal: Negative.   Skin: Negative.   Allergic/Immunologic: Negative.   Neurological: Negative.  Negative for tingling, weakness, numbness, headaches and paresthesias.  Hematological: Negative.   Psychiatric/Behavioral: Negative.     Social History  Substance Use Topics  . Smoking status: Current Every Day  Smoker -- 0.50 packs/day    Types: Cigarettes  . Smokeless tobacco: Never Used  . Alcohol Use: No   Objective:   BP 118/76 mmHg  Pulse 84  Temp(Src) 98.8 F (37.1 C) (Oral)  Resp 16  Wt 145 lb 3.2 oz (65.862 kg)  Physical Exam  Constitutional: She is oriented to person, place, and time. She appears well-developed and well-nourished. No distress.  Cardiovascular: Normal rate, regular rhythm and normal heart sounds.  Exam reveals no gallop and no friction rub.   No murmur heard. Pulmonary/Chest: Effort normal and breath sounds normal. No respiratory distress. She has no wheezes. She has no rales.  Abdominal: Soft. Normal appearance and bowel sounds are normal. She exhibits no distension, no abdominal bruit, no ascites, no pulsatile midline mass and no mass. There is no hepatosplenomegaly. There is tenderness in the right upper quadrant and right lower quadrant. There is no rebound, no guarding and no CVA tenderness.  Neurological: She is alert and oriented to person, place, and time.  Skin: Skin is warm and dry. She is not diaphoretic.        Assessment & Plan:     1. Flank pain UA was positive for hematuria and trace ketones and trace protein. She did have 2+ glucose as well but this is common secondary to her type 1 diabetes. Specific gravity was also increased at 1.030. This could be secondary to her having been NPO during her pancreatic flare. - POCT urinalysis dipstick  2. Hematuria I do feel that her symptoms are most likely a renal stone. I will get a CT renal stone study to evaluate this further. I also did refill her pain medication as below to help control the discomfort from the renal stone. I did advise her to call the neurologist that had previously given her pain medication so that they know that she has this medication. I will follow-up with her pending the results of the CT stone study. I did encourage her to push fluids as well. I also advised her to go to the hospital  if the pain worsens or if she develops inability to void. She voiced understanding and agrees to do so. I will see her back following the results of the CT study. She also already has a scheduled appointment in approximately 2 weeks to follow-up her diabetes. - CT RENAL STONE STUDY; Future - oxyCODONE-acetaminophen (ROXICET) 5-325 MG tablet; Take 1 tablet by mouth every 8 (eight) hours as needed for moderate pain or severe pain.  Dispense: 30 tablet; Refill: 0  3. Acute right flank pain See above medical treatment plan for hematuria. - CT RENAL STONE STUDY; Future - oxyCODONE-acetaminophen (ROXICET) 5-325 MG tablet; Take 1 tablet by mouth every 8 (eight) hours as needed for moderate pain or severe pain.  Dispense: 30 tablet; Refill: 0  4. Nausea During her pancreatitis flare she was given Reglan at the hospital but she states that she did  not have this filled. I discontinued this medication from her med list. She states she does have Phenergan at home that she only takes as needed when the nausea is severe. Medication was pulled as below to update medication list. This medication was not sent in to her pharmacy for refill. - promethazine (PHENERGAN) 25 MG tablet; Take 1 tablet (25 mg total) by mouth every 8 (eight) hours as needed for nausea or vomiting.  Dispense: 20 tablet; Refill: 0    Follow up: No Follow-up on file.

## 2015-03-28 NOTE — Patient Instructions (Signed)
Kidney Stones °Kidney stones (urolithiasis) are deposits that form inside your kidneys. The intense pain is caused by the stone moving through the urinary tract. When the stone moves, the ureter goes into spasm around the stone. The stone is usually passed in the urine.  °CAUSES  °· A disorder that makes certain neck glands produce too much parathyroid hormone (primary hyperparathyroidism). °· A buildup of uric acid crystals, similar to gout in your joints. °· Narrowing (stricture) of the ureter. °· A kidney obstruction present at birth (congenital obstruction). °· Previous surgery on the kidney or ureters. °· Numerous kidney infections. °SYMPTOMS  °· Feeling sick to your stomach (nauseous). °· Throwing up (vomiting). °· Blood in the urine (hematuria). °· Pain that usually spreads (radiates) to the groin. °· Frequency or urgency of urination. °DIAGNOSIS  °· Taking a history and physical exam. °· Blood or urine tests. °· CT scan. °· Occasionally, an examination of the inside of the urinary bladder (cystoscopy) is performed. °TREATMENT  °· Observation. °· Increasing your fluid intake. °· Extracorporeal shock wave lithotripsy--This is a noninvasive procedure that uses shock waves to break up kidney stones. °· Surgery may be needed if you have severe pain or persistent obstruction. There are various surgical procedures. Most of the procedures are performed with the use of small instruments. Only small incisions are needed to accommodate these instruments, so recovery time is minimized. °The size, location, and chemical composition are all important variables that will determine the proper choice of action for you. Talk to your health care provider to better understand your situation so that you will minimize the risk of injury to yourself and your kidney.  °HOME CARE INSTRUCTIONS  °· Drink enough water and fluids to keep your urine clear or pale yellow. This will help you to pass the stone or stone fragments. °· Strain  all urine through the provided strainer. Keep all particulate matter and stones for your health care provider to see. The stone causing the pain may be as small as a grain of salt. It is very important to use the strainer each and every time you pass your urine. The collection of your stone will allow your health care provider to analyze it and verify that a stone has actually passed. The stone analysis will often identify what you can do to reduce the incidence of recurrences. °· Only take over-the-counter or prescription medicines for pain, discomfort, or fever as directed by your health care provider. °· Keep all follow-up visits as told by your health care provider. This is important. °· Get follow-up X-rays if required. The absence of pain does not always mean that the stone has passed. It may have only stopped moving. If the urine remains completely obstructed, it can cause loss of kidney function or even complete destruction of the kidney. It is your responsibility to make sure X-rays and follow-ups are completed. Ultrasounds of the kidney can show blockages and the status of the kidney. Ultrasounds are not associated with any radiation and can be performed easily in a matter of minutes. °· Make changes to your daily diet as told by your health care provider. You may be told to: °¨ Limit the amount of salt that you eat. °¨ Eat 5 or more servings of fruits and vegetables each day. °¨ Limit the amount of meat, poultry, fish, and eggs that you eat. °· Collect a 24-hour urine sample as told by your health care provider. You may need to collect another urine sample every 6-12   months. °SEEK MEDICAL CARE IF: °· You experience pain that is progressive and unresponsive to any pain medicine you have been prescribed. °SEEK IMMEDIATE MEDICAL CARE IF:  °· Pain cannot be controlled with the prescribed medicine. °· You have a fever or shaking chills. °· The severity or intensity of pain increases over 18 hours and is not  relieved by pain medicine. °· You develop a new onset of abdominal pain. °· You feel faint or pass out. °· You are unable to urinate. °  °This information is not intended to replace advice given to you by your health care provider. Make sure you discuss any questions you have with your health care provider. °  °Document Released: 02/10/2005 Document Revised: 11/01/2014 Document Reviewed: 07/14/2012 °Elsevier Interactive Patient Education ©2016 [employer] ° °

## 2015-03-29 ENCOUNTER — Ambulatory Visit
Admission: RE | Admit: 2015-03-29 | Discharge: 2015-03-29 | Disposition: A | Discharge status: Home / Self Care | Payer: BLUE CROSS/BLUE SHIELD | Source: Ambulatory Visit | Attending: Physician Assistant | Admitting: Physician Assistant

## 2015-03-29 ENCOUNTER — Telehealth: Payer: Self-pay | Admitting: Physician Assistant

## 2015-03-29 DIAGNOSIS — R1011 Right upper quadrant pain: Secondary | ICD-10-CM | POA: Diagnosis not present

## 2015-03-29 DIAGNOSIS — R109 Unspecified abdominal pain: Secondary | ICD-10-CM

## 2015-03-29 DIAGNOSIS — R319 Hematuria, unspecified: Secondary | ICD-10-CM | POA: Diagnosis present

## 2015-03-29 NOTE — Telephone Encounter (Signed)
Referral to urology made.

## 2015-03-29 NOTE — Telephone Encounter (Signed)
Patient advised as directed blow. Per patient if you can please put the referral to the urologist.  Thanks,  -Joseline

## 2015-03-29 NOTE — Telephone Encounter (Signed)
Please notify patient CT scan did not show any renal calculi nor any other abnormality. I'm not sure what is causing her right-sided flank pain. If she continues to have pain I do recommend follow-up with urology to workup for hematuria.

## 2015-04-01 LAB — TOXASSURE SELECT 13 (MW), URINE: PDF: 0

## 2015-04-02 ENCOUNTER — Encounter: Payer: Self-pay | Admitting: Urology

## 2015-04-02 ENCOUNTER — Ambulatory Visit (INDEPENDENT_AMBULATORY_CARE_PROVIDER_SITE_OTHER): Payer: BLUE CROSS/BLUE SHIELD | Admitting: Urology

## 2015-04-02 VITALS — BP 120/81 | HR 99 | Ht 59.0 in | Wt 145.7 lb

## 2015-04-02 DIAGNOSIS — R109 Unspecified abdominal pain: Secondary | ICD-10-CM | POA: Diagnosis not present

## 2015-04-02 DIAGNOSIS — R351 Nocturia: Secondary | ICD-10-CM | POA: Diagnosis not present

## 2015-04-02 DIAGNOSIS — R3129 Other microscopic hematuria: Secondary | ICD-10-CM | POA: Diagnosis not present

## 2015-04-02 LAB — URINALYSIS, COMPLETE
Bilirubin, UA: NEGATIVE
Ketones, UA: NEGATIVE
Leukocytes, UA: NEGATIVE
NITRITE UA: NEGATIVE
Protein, UA: NEGATIVE
Specific Gravity, UA: 1.015 (ref 1.005–1.030)
UUROB: 0.2 mg/dL (ref 0.2–1.0)
pH, UA: 5 (ref 5.0–7.5)

## 2015-04-02 LAB — MICROSCOPIC EXAMINATION
Bacteria, UA: NONE SEEN
WBC, UA: NONE SEEN /hpf (ref 0–?)

## 2015-04-02 LAB — BLADDER SCAN AMB NON-IMAGING: SCAN RESULT: 162

## 2015-04-02 NOTE — Progress Notes (Signed)
04/02/2015 2:21 PM   Savreen CECILLE MCCLUSKY [birth year] [id number]  Referring provider: Margaretann Loveless, PA-C [address] [place], [place] [address]  Chief Complaint  Patient presents with  . Hematuria    referred by [facility]  . Back Pain    low back pain and pain near groin    HPI: Patient is a 51 year old Caucasian female who is referred to [place] for hematuria and flank pain, right greater than left for one week.  Hematuria Patient does not endorse any gross hematuria.  She does state that her urine is darker in the mornings, but she drinks a lot of tea and very little water.  Microscopic hematuria has not been demonstrated on her microscopic urinalysis.    Flank pain She says that she's been experiencing flank pain for about 1 week. It is worse in the right versus the left flank.  She has not had a distinct injury to her flank area.  She does have a history of chronic pancreatitis with her last episode being 2 weeks Saturday.  She states this pain is different from her pancreatitis pain.  CT renal stone study completed 03/29/2015 noted no acute abnormality. No ureterolithiasis. No evidence of urinary tract obstruction.  She does not feel that anything makes the pain worse or better.  She has not had fevers, chills or vomiting.  She does admit to nausea, but this is a long standing symptom.    Nocturia Patient states that she is getting up every hour during the evening to urinate.  She is currently on Ambien for sleep difficulties.  He also experiencing frequency and urgency of urination. She did have 3+ glucose in her urinalysis today.  Her PVR is 162 mL.    PMH: Past Medical History  Diagnosis Date  . Diabetes mellitus without complication (HCC)   . Pancreatitis   . Hypertension   . Heartburn   . HLD (hyperlipidemia)     Surgical History: Past Surgical History  Procedure Laterality Date  . Cholecystectomy    . Appendectomy    . Abdominal  hysterectomy    . Cardiac catheterization    . Foot surgery Bilateral     for neuropathy    Home Medications:    Medication List       This list is accurate as of: 04/02/15  2:21 PM.  Always use your most recent med list.               acyclovir 800 MG tablet  Commonly known as:  ZOVIRAX  Take 800 mg by mouth as needed.     atorvastatin 20 MG tablet  Commonly known as:  LIPITOR  Take 20 mg by mouth daily.     CREON 24000 units Cpep  Generic drug:  Pancrelipase (Lip-Prot-Amyl)  Take 24,000 Units by mouth 3 (three) times daily with meals. 2 tablets with meals and 1 tablet with snacks     insulin aspart 100 UNIT/ML injection  Commonly known as:  novoLOG  Inject 19 Units into the skin 3 (three) times daily before meals. Sliding scale     insulin glargine 100 UNIT/ML injection  Commonly known as:  LANTUS  Inject 0.35 mLs (35 Units total) into the skin at bedtime.     oxyCODONE-acetaminophen 5-325 MG tablet  Commonly known as:  ROXICET  Take 1 tablet by mouth every 8 (eight) hours as needed for moderate pain or severe pain.     promethazine  25 MG tablet  Commonly known as:  PHENERGAN  Take 25 mg by mouth every 8 (eight) hours as needed for nausea or vomiting. Reported on 04/02/2015     ranitidine 150 MG tablet  Commonly known as:  ZANTAC  Take 150 mg by mouth as needed. Reported on 04/02/2015     traMADol 50 MG tablet  Commonly known as:  ULTRAM  Take 1 tablet (50 mg total) by mouth every 6 (six) hours as needed.     zolpidem 12.5 MG CR tablet  Commonly known as:  AMBIEN CR  Take 1 tablet (12.5 mg total) by mouth at bedtime as needed for sleep.        Allergies:  Allergies  Allergen Reactions  . Coconut Oil Hives  . Latex Hives  . Morphine And Related Itching and Rash    Family History: Family History  Problem Relation Age of Onset  . Cancer Father     Lymphoma  . Cancer Mother     lung  . Diabetes Brother   . Kidney cancer Father   . Bladder Cancer  Neg Hx     Social History:  reports that she has been smoking Cigarettes.  She has been smoking about 0.50 packs per day. She has never used smokeless tobacco. She reports that she does not drink alcohol or use illicit drugs.  ROS: UROLOGY Frequent Urination?: Yes Hard to postpone urination?: Yes Burning/pain with urination?: No Get up at night to urinate?: Yes Leakage of urine?: No Urine stream starts and stops?: No Trouble starting stream?: No Do you have to strain to urinate?: No Blood in urine?: Yes Urinary tract infection?: No Sexually transmitted disease?: No Injury to kidneys or bladder?: No Painful intercourse?: No Weak stream?: No Currently pregnant?: No Vaginal bleeding?: No Last menstrual period?: n  Gastrointestinal Nausea?: Yes Vomiting?: Yes Indigestion/heartburn?: Yes Diarrhea?: No Constipation?: No  Constitutional Fever: No Night sweats?: Yes Weight loss?: Yes Fatigue?: Yes  Skin Skin rash/lesions?: No Itching?: No  Eyes Blurred vision?: No Double vision?: No  Ears/Nose/Throat Sore throat?: No Sinus problems?: No  Hematologic/Lymphatic Swollen glands?: No Easy bruising?: Yes  Cardiovascular Leg swelling?: No Chest pain?: No  Respiratory Cough?: No Shortness of breath?: No  Endocrine Excessive thirst?: Yes  Musculoskeletal Back pain?: Yes Joint pain?: No  Neurological Headaches?: Yes Dizziness?: No  Psychologic Depression?: No Anxiety?: No  Physical Exam: BP 120/81 mmHg  Pulse 99  Ht  (1.499 m)  Wt 145 lb 11.2 oz (66.089 kg)  BMI 29.41 kg/m2  Constitutional: Well nourished. Alert and oriented, No acute distress. HEENT: [place] AT, moist mucus membranes. Trachea midline, no masses. Cardiovascular: No clubbing, cyanosis, or edema. Respiratory: Normal respiratory effort, no increased work of breathing. GI: Abdomen is soft, non tender, non distended, no abdominal masses. Liver and spleen not palpable.  No hernias  appreciated.  Stool sample for occult testing is not indicated.   GU: No CVA tenderness.  No bladder fullness or masses.  Normal external genitalia, normal pubic hair distribution, no lesions.  Normal urethral meatus, no lesions, no prolapse, no discharge.   No urethral masses, tenderness and/or tenderness. No bladder fullness, tenderness or masses. Normal vagina mucosa, good estrogen effect, no discharge, no lesions, good pelvic support, no cystocele or rectocele noted.  Cervix, uterus and ovaries are surgically absent. No pelvic masses are palpated on exam.  Anus and perineum are without rashes or lesions.    Skin: No rashes, bruises or suspicious lesions. Lymph: No  cervical or inguinal adenopathy. Neurologic: Grossly intact, no focal deficits, moving all 4 extremities. Psychiatric: Normal mood and affect.  Laboratory Data: Lab Results  Component Value Date   WBC 7.6 03/24/2015   HGB 16.1* 03/24/2015   HCT 47.1* 03/24/2015   MCV 90.1 03/24/2015   PLT 198 03/24/2015    Lab Results  Component Value Date   CREATININE 0.36* 03/24/2015    Lab Results  Component Value Date   HGBA1C 14.0 03/19/2015    Lab Results  Component Value Date   AST 17 03/24/2015   Lab Results  Component Value Date   ALT 17 03/24/2015    Urinalysis Results for orders placed or performed in visit on 04/02/15  Microscopic Examination  Result Value Ref Range   WBC, UA None seen 0 -  5 /hpf   RBC, UA 0-2 0 -  2 /hpf   Epithelial Cells (non renal) 0-10 0 - 10 /hpf   Bacteria, UA None seen None seen/Few  Urinalysis, Complete  Result Value Ref Range   Specific Gravity, UA 1.015 1.005 - 1.030   pH, UA 5.0 5.0 - 7.5   Color, UA Yellow Yellow   Appearance Ur Clear Clear   Leukocytes, UA Negative Negative   Protein, UA Negative Negative/Trace   Glucose, UA 3+ (A) Negative   Ketones, UA Negative Negative   RBC, UA Trace (A) Negative   Bilirubin, UA Negative Negative   Urobilinogen, Ur 0.2 0.2 - 1.0  mg/dL   Nitrite, UA Negative Negative   Microscopic Examination See below:   BLADDER SCAN AMB NON-IMAGING  Result Value Ref Range   Scan Result 162     Pertinent Imaging: Results for AZARIYA, FREEMAN (MRN [id number]) as of 04/02/2015 13:39  Ref. Range 04/02/2015 11:43  Scan Result Unknown 162    Assessment & Plan:    1. Microscopic hematuria:   The AUA guidelines state that [facility] is 3 or greater RBC's/hpf on microscopic urinalysis.  She has not demonstrated any microscopic hematuria on recent UA's.  She is positive on urine dip for blood.  I explained this to the patient.  She does not have microscopic hematuria on today's exam.  She does not report any gross hematuria.  CT Renal Stone study completed on 03/29/2015 did not demonstrate any acute abnormality.  She will return in 1 week for repeat urinalysis. She will contact the office if she should develop gross hematuria in the interim.  - Urinalysis, Complete - CULTURE, URINE COMPREHENSIVE - BUN+Creat  2. Flank pain:   Patient complains of bilateral flank pain with the right being worse than the left.  Her CT was negative for stones and obstruction.  Her UA was unremarkable today.  I will send the urine for culture.  Her pain may be musculoskeletal in nature.    3. Nocturia:   Patient is having significant urinary issues at night.  Her husband states she snores.  I have asked her to speak with her PCP about obtaining a sleep study to rule out sleep apnea, as this is frequently the cause of nocturia.     Return in about 1 week (around 04/09/2015) for UA only.  These notes generated with voice recognition software. I apologize for typographical errors.  Michiel Cowboy, PA-C  [facility] [address] [place], [place] [address] [phone]

## 2015-04-03 LAB — BUN+CREAT
BUN/Creatinine Ratio: 19 (ref 9–23)
BUN: 10 mg/dL (ref 6–24)
Creatinine, Ser: 0.52 mg/dL — ABNORMAL LOW (ref 0.57–1.00)
GFR, EST AFRICAN AMERICAN: 131 mL/min/{1.73_m2} (ref >59)
GFR, EST NON AFRICAN AMERICAN: 113 mL/min/{1.73_m2} (ref >59)

## 2015-04-04 LAB — CULTURE, URINE COMPREHENSIVE

## 2015-04-06 ENCOUNTER — Other Ambulatory Visit: Payer: Self-pay

## 2015-04-06 DIAGNOSIS — R3129 Other microscopic hematuria: Secondary | ICD-10-CM

## 2015-04-09 ENCOUNTER — Other Ambulatory Visit: Payer: BLUE CROSS/BLUE SHIELD

## 2015-04-09 DIAGNOSIS — R3129 Other microscopic hematuria: Secondary | ICD-10-CM

## 2015-04-09 LAB — URINALYSIS, COMPLETE
BILIRUBIN UA: NEGATIVE
KETONES UA: NEGATIVE
Leukocytes, UA: NEGATIVE
NITRITE UA: NEGATIVE
PROTEIN UA: NEGATIVE
SPEC GRAV UA: 1.01 (ref 1.005–1.030)
UUROB: 0.2 mg/dL (ref 0.2–1.0)
pH, UA: 5.5 (ref 5.0–7.5)

## 2015-04-09 LAB — MICROSCOPIC EXAMINATION: Bacteria, UA: NONE SEEN

## 2015-04-10 ENCOUNTER — Telehealth: Payer: Self-pay

## 2015-04-10 ENCOUNTER — Other Ambulatory Visit: Payer: Self-pay | Admitting: Urology

## 2015-04-10 ENCOUNTER — Telehealth: Payer: Self-pay | Admitting: Urology

## 2015-04-10 DIAGNOSIS — R319 Hematuria, unspecified: Secondary | ICD-10-CM

## 2015-04-10 DIAGNOSIS — R3129 Other microscopic hematuria: Secondary | ICD-10-CM

## 2015-04-10 NOTE — Telephone Encounter (Signed)
I saw where the patient is out of town with her sick father. If i get her CT scan approved now and she doesn't return for another month or so it will expire so i  Will wait until she calls back to schd her cysto before i get this done. It will stay in my work queue so i will be able to see it and keep a check on whether or not she has called back. If she hasn't called back in a few weeks i will call her to check in.   Thanks,  Marcelino Duster

## 2015-04-10 NOTE — Telephone Encounter (Signed)
-----   Message from Harle Battiest, PA-C sent at 04/10/2015  8:40 AM EST ----- Patient will blood in the urine.  She will need a CT Urogram and cystoscopy.  I have placed the orders for the CT Urogram and the BUN + creatinine.

## 2015-04-10 NOTE — Telephone Encounter (Signed)
Spoke with pt in reference to hematuria, CT urogram, and cysto. Pt stated that she is currently in [place] with her father who is terminal. Pt stated that when she returns to [facility] she will give [facility] a call to schedule cysto. Made pt aware scheduling will be giving her a call to schedule the CT. Made pt aware we need labs prior to CT urogram. Pt voiced understanding. Lab orders placed.

## 2015-04-16 ENCOUNTER — Ambulatory Visit: Payer: BLUE CROSS/BLUE SHIELD | Admitting: Physician Assistant

## 2015-04-18 ENCOUNTER — Other Ambulatory Visit: Payer: BLUE CROSS/BLUE SHIELD

## 2015-04-18 DIAGNOSIS — R3129 Other microscopic hematuria: Secondary | ICD-10-CM

## 2015-04-19 ENCOUNTER — Encounter: Payer: Self-pay | Admitting: Gastroenterology

## 2015-04-19 ENCOUNTER — Ambulatory Visit (INDEPENDENT_AMBULATORY_CARE_PROVIDER_SITE_OTHER): Payer: BLUE CROSS/BLUE SHIELD | Admitting: Gastroenterology

## 2015-04-19 ENCOUNTER — Ambulatory Visit
Admission: RE | Admit: 2015-04-19 | Discharge: 2015-04-19 | Disposition: A | Discharge status: Home / Self Care | Payer: BLUE CROSS/BLUE SHIELD | Source: Ambulatory Visit | Attending: Urology | Admitting: Urology

## 2015-04-19 VITALS — BP 120/72 | HR 95 | Temp 98.9°F | Ht 59.0 in | Wt 150.0 lb

## 2015-04-19 DIAGNOSIS — R140 Abdominal distension (gaseous): Secondary | ICD-10-CM | POA: Insufficient documentation

## 2015-04-19 DIAGNOSIS — K860 Alcohol-induced chronic pancreatitis: Secondary | ICD-10-CM | POA: Diagnosis not present

## 2015-04-19 DIAGNOSIS — Z9049 Acquired absence of other specified parts of digestive tract: Secondary | ICD-10-CM | POA: Diagnosis not present

## 2015-04-19 DIAGNOSIS — R3129 Other microscopic hematuria: Secondary | ICD-10-CM | POA: Insufficient documentation

## 2015-04-19 LAB — BUN+CREAT
BUN/Creatinine Ratio: 19 (ref 9–23)
BUN: 10 mg/dL (ref 6–24)
Creatinine, Ser: 0.53 mg/dL — ABNORMAL LOW (ref 0.57–1.00)
GFR calc Af Amer: 130 mL/min/{1.73_m2} (ref >59)
GFR, EST NON AFRICAN AMERICAN: 113 mL/min/{1.73_m2} (ref >59)

## 2015-04-19 MED ORDER — IOHEXOL 300 MG/ML  SOLN
125.0000 mL | Freq: Once | INTRAMUSCULAR | Status: AC | PRN
Start: 2015-04-19 — End: 2015-04-19
  Administered 2015-04-19: 125 mL via INTRAVENOUS

## 2015-04-19 NOTE — Progress Notes (Signed)
Gastroenterology Consultation  Referring Provider:     Suella Grove* Primary Care Physician:  Margaretann Loveless, PA-C Primary Gastroenterologist:  Dr. Servando Snare     Reason for Consultation:     Chronic pancreatitis        HPI:   Veronica Lozano is a 50 y.o. y/o female referred for consultation & management of Chronic pancreatitis by Dr. Margaretann Loveless, PA-C.   This patient comes today for establishment of care with me after being followed at [facility]. The patient states that she had an ERCP that showed her to have chronic pancreatitis. The patient has had attacks and acute pancreatitis and has been moved the hospital for that. She reports that she was put on Creon for her pancreatic insufficiency. The patient had an MRI and a CT scan that did not show any signs of chronic pancreatitis but the patient reports that she has it. She does report that certain foods including fatty foods and greasy foods will cause her to get severe abdominal pain and start her on a path pancreatitis so she quickly stops eating and only drinks liquids until that results. There is no report of any recent unexplained weight loss, fevers, chills, nausea or vomiting. The patient states she cannot go to [facility] anymore because of Insurance problems. The patient reports that her abdominal pain when she has the pancreatitis is in the middle but right now she is having pain on the flanks on both sides. The patient reports that she stopped drinking 20 years ago.  Past Medical History  Diagnosis Date  . Diabetes mellitus without complication (HCC)   . Pancreatitis   . Hypertension   . Heartburn   . HLD (hyperlipidemia)     Past Surgical History  Procedure Laterality Date  . Cholecystectomy    . Appendectomy    . Abdominal hysterectomy    . Cardiac catheterization    . Foot surgery Bilateral     for neuropathy    Prior to Admission medications   Medication Sig Start  Date End Date Taking? Authorizing Provider  acyclovir (ZOVIRAX) 800 MG tablet Take 800 mg by mouth as needed.   Yes Historical Provider, MD  atorvastatin (LIPITOR) 20 MG tablet Take 20 mg by mouth daily. 03/15/15  Yes Historical Provider, MD  insulin aspart (NOVOLOG) 100 UNIT/ML injection Inject 19 Units into the skin 3 (three) times daily before meals. Sliding scale   Yes Historical Provider, MD  insulin glargine (LANTUS) 100 UNIT/ML injection Inject 0.35 mLs (35 Units total) into the skin at bedtime. 03/19/15  Yes Margaretann Loveless, PA-C  oxyCODONE-acetaminophen (ROXICET) 5-325 MG tablet Take 1 tablet by mouth every 8 (eight) hours as needed for moderate pain or severe pain. 03/28/15  Yes Alessandra Bevels Burnette, PA-C  Pancrelipase, Lip-Prot-Amyl, (CREON) 24000 UNITS CPEP Take 24,000 Units by mouth 3 (three) times daily with meals. 2 tablets with meals and 1 tablet with snacks   Yes Historical Provider, MD  promethazine (PHENERGAN) 25 MG tablet Take 25 mg by mouth every 8 (eight) hours as needed for nausea or vomiting. Reported on 04/02/2015 03/28/15  Yes Alessandra Bevels Burnette, PA-C  ranitidine (ZANTAC) 150 MG tablet Take 150 mg by mouth as needed. Reported on 04/02/2015 12/15/14 12/15/15 Yes Historical Provider, MD  zolpidem (AMBIEN CR) 12.5 MG CR tablet Take 1 tablet (12.5 mg total) by mouth at bedtime as needed for sleep. 03/19/15  Yes Margaretann Loveless, PA-C  Family History  Problem Relation Age of Onset  . Cancer Father     Lymphoma  . Kidney cancer Father   . Cancer Mother     lung  . Diabetes Brother   . Bladder Cancer Neg Hx      Social History  Substance Use Topics  . Smoking status: Current Every Day Smoker -- 0.50 packs/day    Types: Cigarettes  . Smokeless tobacco: Never Used  . Alcohol Use: No    Allergies as of 04/19/2015 - Review Complete 04/19/2015  Allergen Reaction Noted  . Coconut oil Hives 10/10/2014  . Latex Hives 09/19/2014  . Morphine and related Itching and Rash  09/19/2014    Review of Systems:    All systems reviewed and negative except where noted in HPI.   Physical Exam:  BP 120/72 mmHg  Pulse 95  Temp(Src) 98.9 F (37.2 C) (Oral)  Ht 4\' 11"  (1.499 m)  Wt 150 lb (68.04 kg)  BMI 30.28 kg/m2 No LMP recorded. Patient has had a hysterectomy. Psych:  Alert and cooperative. Normal mood and affect. General:   Alert,  Well-developed, well-nourished, pleasant and cooperative in NAD Head:  Normocephalic and atraumatic. Eyes:  Sclera clear, no icterus.   Conjunctiva pink. Ears:  Normal auditory acuity. Nose:  No deformity, discharge, or lesions. Mouth:  No deformity or lesions,oropharynx pink & moist. Neck:  Supple; no masses or thyromegaly. Lungs:  Respirations even and unlabored.  Clear throughout to auscultation.   No wheezes, crackles, or rhonchi. No acute distress. Heart:  Regular rate and rhythm; no murmurs, clicks, rubs, or gallops. Abdomen:  Normal bowel sounds.  No bruits.  Soft, Positive tender to palpation with the abdominal wall flex. Non-distended without masses, hepatosplenomegaly or hernias noted.  No guarding or rebound tenderness.  Positive Carnett sign.   Rectal:  Deferred.  Msk:  Symmetrical without gross deformities.  Good, equal movement & strength bilaterally. Pulses:  Normal pulses noted. Extremities:  No clubbing or edema.  No cyanosis. Neurologic:  Alert and oriented x3;  grossly normal neurologically. Skin:  Intact without significant lesions or rashes.  No jaundice. Lymph Nodes:  No significant cervical adenopathy. Psych:  Alert and cooperative. Normal mood and affect.  Imaging Studies: Ct Abdomen Pelvis W Wo Contrast  04/19/2015  CLINICAL DATA:  Microhematuria. Right greater than left flank pain for 1 week. Prior cholecystectomy, appendectomy and hysterectomy. EXAM: CT ABDOMEN AND PELVIS WITHOUT AND WITH CONTRAST TECHNIQUE: Multidetector CT imaging of the abdomen and pelvis was performed following the standard  protocol before and following the bolus administration of intravenous contrast. CONTRAST:  OMNIPAQUE IOHEXOL 300 MG/ML  SOLN COMPARISON:  03/29/2015 unenhanced CT abdomen/pelvis. FINDINGS: Lower chest: Anterior right lower lobe 2 mm pulmonary nodule (series 8/ image 5) is unchanged since 05/29/2014 and probably benign. Hepatobiliary: Normal liver with no liver mass. Cholecystectomy. No biliary ductal dilatation. Pancreas: Normal, with no mass or duct dilation. Spleen: Normal size spleen. Tiny granulomatous calcifications in the spleen. No splenic mass. Adrenals/Urinary Tract: Normal adrenals. No renal stones. No hydronephrosis. No renal cortical masses. Normal caliber ureters, with no ureteral stones. On delayed imaging, there is no urothelial wall thickening and there are no filling defects in the opacified portions of the bilateral collecting systems or ureters. No bladder stones. No bladder wall thickening. No bladder mass or diverticulum. There is a tiny focus of gas in the nondependent bladder lumen. Stomach/Bowel: Grossly normal stomach. Normal caliber small bowel with no small bowel wall thickening. Status  post appendectomy. Normal large bowel with no diverticulosis, large bowel wall thickening or pericolonic fat stranding. Vascular/Lymphatic: Atherosclerotic nonaneurysmal abdominal aorta. Patent portal, splenic, hepatic and renal veins. No pathologically enlarged lymph nodes in the abdomen or pelvis. Reproductive: Status post hysterectomy, with no abnormal findings at the vaginal cuff. No adnexal mass. Other: No pneumoperitoneum, ascites or focal fluid collection. Musculoskeletal: No aggressive appearing focal osseous lesions. Mild degenerative changes in the visualized thoracolumbar spine. IMPRESSION: 1. No urolithiasis. No evidence of urinary tract obstruction. No renal cortical masses. No urothelial lesions. 2. Unexplained gas in the bladder lumen, which could be explained by any recent bladder  instrumentation. If there is no history of recent bladder instrumentation, further evaluation may be warranted such as with urinalysis, cystoscopy or other evaluations. Electronically Signed   By: Delbert Phenix M.D.   On: 04/19/2015 10:45   Dg Chest 2 View  03/24/2015  CLINICAL DATA:  Epigastric pain, chest pain. EXAM: CHEST  2 VIEW COMPARISON:  02/09/2015 FINDINGS: The heart size and mediastinal contours are within normal limits. Both lungs are clear. The visualized skeletal structures are unremarkable. IMPRESSION: No active cardiopulmonary disease. Electronically Signed   By: Charlett Nose M.D.   On: 03/24/2015 14:47   Ct Renal Stone Study  03/29/2015  CLINICAL DATA:  Right flank pain radiating to the groin. Microscopic hematuria. EXAM: CT ABDOMEN AND PELVIS WITHOUT CONTRAST TECHNIQUE: Multidetector CT imaging of the abdomen and pelvis was performed following the standard protocol without IV contrast. COMPARISON:  06/24/2014 CT abdomen/ pelvis.  10/20/2014 MRI abdomen. FINDINGS: Lower chest: No significant pulmonary nodules or acute consolidative airspace disease. Hepatobiliary: Normal liver with no liver mass. Cholecystectomy. No biliary ductal dilatation. Pancreas: Normal, with no mass or duct dilation. Spleen: Normal size spleen. Hypodense 0.6 cm lesion in the central spleen is too small to characterize, and appears unchanged since 05/29/2014, suggesting a benign lesion. Stable granulomatous calcification in the superior spleen. Adrenals/Urinary Tract: Normal adrenals. No renal stones. No hydronephrosis. No contour deforming renal mass. Normal caliber ureters, with no ureteral stones. Normal bladder. Stomach/Bowel: Grossly normal stomach. Normal caliber small bowel with no small bowel wall thickening. Status post appendectomy. Normal large bowel with no diverticulosis, large bowel wall thickening or pericolonic fat stranding. Vascular/Lymphatic: Atherosclerotic nonaneurysmal abdominal aorta. No  pathologically enlarged lymph nodes in the abdomen or pelvis. Reproductive: Status post hysterectomy, with no abnormal findings at the vaginal cuff. No adnexal mass. Other: No pneumoperitoneum, ascites or focal fluid collection. Musculoskeletal: No aggressive appearing focal osseous lesions. Mild to moderate degenerative changes in the visualized thoracolumbar spine. IMPRESSION: No acute abnormality. No urolithiasis. No evidence of urinary tract obstruction. Electronically Signed   By: Delbert Phenix M.D.   On: 03/29/2015 08:39    Assessment and Plan:   Veronica Lozano is a 50 y.o. y/o female who has a history of chronic pancreatitis without any calcifications or pancreatic duct abnormalities seen on the MRI or CT scan. She reports that she stopped drinking 20 years ago. The patient has had pancreatitis in the past. She is also on Creon for her pancreatic insufficiency. The patient is here to establish care with me now. She has been told to contact me if she has any abdominal pain and if she needs a refill of any of her medication. The patient has been explained the plan and agrees with it.   Note: This dictation was prepared with Dragon dictation along with smaller phrase technology. Any transcriptional errors that result from this process are  unintentional.

## 2015-04-24 ENCOUNTER — Encounter: Payer: Self-pay | Admitting: Physician Assistant

## 2015-04-24 ENCOUNTER — Ambulatory Visit (INDEPENDENT_AMBULATORY_CARE_PROVIDER_SITE_OTHER): Payer: BLUE CROSS/BLUE SHIELD | Admitting: Physician Assistant

## 2015-04-24 VITALS — BP 120/70 | HR 79 | Temp 97.2°F | Resp 16 | Wt 147.8 lb

## 2015-04-24 DIAGNOSIS — Z79891 Long term (current) use of opiate analgesic: Secondary | ICD-10-CM | POA: Diagnosis not present

## 2015-04-24 DIAGNOSIS — G4700 Insomnia, unspecified: Secondary | ICD-10-CM

## 2015-04-24 DIAGNOSIS — E1042 Type 1 diabetes mellitus with diabetic polyneuropathy: Secondary | ICD-10-CM

## 2015-04-24 DIAGNOSIS — G8929 Other chronic pain: Secondary | ICD-10-CM

## 2015-04-24 LAB — POCT GLYCOSYLATED HEMOGLOBIN (HGB A1C): Est. average glucose Bld gHb Est-mCnc: >326

## 2015-04-24 LAB — POCT UA - MICROALBUMIN: MICROALBUMIN (UR) POC: 20 mg/L

## 2015-04-24 MED ORDER — OXYCODONE-ACETAMINOPHEN 5-325 MG PO TABS
1.0000 | ORAL_TABLET | Freq: Three times a day (TID) | ORAL | Status: DC | PRN
Start: 2015-04-24 — End: 2015-05-11

## 2015-04-24 MED ORDER — ESZOPICLONE 3 MG PO TABS: 3.0000 mg | ORAL_TABLET | Freq: Every day | ORAL | Status: DC

## 2015-04-24 NOTE — Progress Notes (Signed)
Patient: Veronica Lozano Female    DOB: 1966/01/23   50 y.o.   MRN: [id number] Visit Date: 04/24/2015  Today's Provider: Margaretann Loveless, PA-C   Chief Complaint  Patient presents with  . Follow-up    diabetes   Subjective:    HPI   Diabetes Mellitus Type II, Follow-up:   Lab Results  Component Value Date   HGBA1C 14.0 03/19/2015   HGBA1C 12.5* 10/19/2014    Last seen for diabetes 4 weeks ago.  Management since then includes Lantus was increased to 35 units nightly and to keep a log of her fasting morning glucose readings. She reports good compliance with treatment. She is not having side effects.  Current symptoms include none but is going to the bathroom more frequent but nor the urgency to go and have been worsening. Home blood sugar records: fasting range: 289  Episodes of hypoglycemia? no   Current Insulin Regimen: Novolog 19 units before each meal and Lantus nightly. Most Recent Eye Exam: 07/2014 Weight trend: stable Prior visit with dietician: no Current diet: in general, an "unhealthy" diet Current exercise: none  Pertinent Labs:    Component Value Date/Time   CREATININE 0.53* 04/18/2015 0926   CREATININE 0.60 06/23/2014 2329    Wt Readings from Last 3 Encounters:  04/24/15 147 lb 12.8 oz (67.042 kg)  04/19/15 150 lb (68.04 kg)  04/02/15 145 lb 11.2 oz (66.089 kg)    ------------------------------------------------------------------------       Allergies  Allergen Reactions  . Coconut Oil Hives  . Latex Hives  . Morphine And Related Itching and Rash   Previous Medications   ACYCLOVIR (ZOVIRAX) 800 MG TABLET    Take 800 mg by mouth as needed.   ATORVASTATIN (LIPITOR) 20 MG TABLET    Take 20 mg by mouth daily.   INSULIN ASPART (NOVOLOG) 100 UNIT/ML INJECTION    Inject 19 Units into the skin 3 (three) times daily before meals. Sliding scale   INSULIN GLARGINE (LANTUS) 100 UNIT/ML INJECTION    Inject 0.35 mLs (35 Units total) into  the skin at bedtime.   OXYCODONE-ACETAMINOPHEN (ROXICET) 5-325 MG TABLET    Take 1 tablet by mouth every 8 (eight) hours as needed for moderate pain or severe pain.   PANCRELIPASE, LIP-PROT-AMYL, (CREON) 24000 UNITS CPEP    Take 24,000 Units by mouth 3 (three) times daily with meals. 2 tablets with meals and 1 tablet with snacks   PROMETHAZINE (PHENERGAN) 25 MG TABLET    Take 25 mg by mouth every 8 (eight) hours as needed for nausea or vomiting. Reported on 04/02/2015   RANITIDINE (ZANTAC) 150 MG TABLET    Take 150 mg by mouth as needed. Reported on 04/24/2015   ZOLPIDEM (AMBIEN CR) 12.5 MG CR TABLET    Take 1 tablet (12.5 mg total) by mouth at bedtime as needed for sleep.    Review of Systems  Constitutional: Negative.   Eyes: Negative for visual disturbance.  Respiratory: Negative.   Cardiovascular: Negative.   Gastrointestinal: Negative.   Endocrine: Negative for cold intolerance, polyphagia and polyuria.  Genitourinary: Positive for frequency, hematuria (not seen but microscopic; followed by urology) and flank pain. Negative for urgency.  Neurological: Negative for dizziness, light-headedness and headaches.    Social History  Substance Use Topics  . Smoking status: Current Every Day Smoker -- 0.50 packs/day    Types: Cigarettes  . Smokeless tobacco: Never Used  . Alcohol Use: No   Objective:  BP 120/70 mmHg  Pulse 79  Temp(Src) 97.2 F (36.2 C) (Oral)  Resp 16  Wt 147 lb 12.8 oz (67.042 kg)  Physical Exam  Constitutional: She appears well-developed and well-nourished. No distress.  Neck: Normal range of motion. Neck supple. No JVD present. No tracheal deviation present. No thyromegaly present.  Cardiovascular: Normal rate, regular rhythm and normal heart sounds.  Exam reveals no gallop and no friction rub.   No murmur heard. Pulmonary/Chest: Effort normal and breath sounds normal. No respiratory distress. She has no wheezes. She has no rales.  Lymphadenopathy:    She has no  cervical adenopathy.  Skin: She is not diaphoretic.  Vitals reviewed.       Assessment & Plan:     1. Type 1 diabetes mellitus with diabetic polyneuropathy (HCC) Increased lantus to 45 units at bedtime. Will also refer her to endocrinology for better control since she is not getting good control at this time and has not since I first started seeing her.  I will see her back in 4 weeks to see how she is doing and see if she has been seen by endocrine at that time. She is to call the office if she has any acute issue, question or concern. - POCT glycosylated hemoglobin (Hb A1C) - POCT UA - Microalbumin - Ambulatory referral to Endocrinology  2. Insomnia, persistent No improvement on ambien. Will switch to lunesta to see if it offers better control.Discussed sleep hygiene and sleep meditation techniques.  She states the reason she awakes is her epigastric pain and nocturia as main causes.  She states once she wakes up she has difficulty falling back asleep.  I will see her back in 4 weeks to see how she is doing with this medication.  She is to call the office if she has any adverse reactions with this medication. - Eszopiclone 3 MG TABS; Take 1 tablet (3 mg total) by mouth at bedtime. Take immediately before bedtime  Dispense: 30 tablet; Refill: 0  3. Long term current use of opiate analgesic Has been well controlled on oxycodone-acetaminophen for her chronic epigastric pain. She has been trying to get an appointment with Dr. Laban Emperor for pain management. She called last week because she was running out and they told her she did not have a pain contract at that time. She did go for the urine screen and did have a small amount of fermented alcohol (fermented glucose) on the screen. She claims she has not drank any alcohol since she was 22, after she became pregnant. I do question if it is possible due to her T1DM, uncontrolled and chronic pancreatitis if it could cause a false positive of fermented  glucose.  Her blood glucose levels have been extremely uncontrolled at this time.  Will refill pain medication as below in the interim until she is seen and starts the pain contract.  - oxyCODONE-acetaminophen (ROXICET) 5-325 MG tablet; Take 1 tablet by mouth every 8 (eight) hours as needed for moderate pain or severe pain.  Dispense: 30 tablet; Refill: 0  4. Chronic pain See above medical treatment plan. - oxyCODONE-acetaminophen (ROXICET) 5-325 MG tablet; Take 1 tablet by mouth every 8 (eight) hours as needed for moderate pain or severe pain.  Dispense: 30 tablet; Refill: 0       Margaretann Loveless, PA-C  [facility]

## 2015-04-30 ENCOUNTER — Ambulatory Visit (INDEPENDENT_AMBULATORY_CARE_PROVIDER_SITE_OTHER): Payer: BLUE CROSS/BLUE SHIELD | Admitting: Urology

## 2015-04-30 ENCOUNTER — Encounter: Payer: Self-pay | Admitting: Urology

## 2015-04-30 VITALS — BP 129/90 | HR 99 | Ht 59.0 in | Wt 149.7 lb

## 2015-04-30 DIAGNOSIS — R3129 Other microscopic hematuria: Secondary | ICD-10-CM

## 2015-04-30 LAB — MICROSCOPIC EXAMINATION: Bacteria, UA: NONE SEEN

## 2015-04-30 LAB — URINALYSIS, COMPLETE
Bilirubin, UA: NEGATIVE
KETONES UA: NEGATIVE
Leukocytes, UA: NEGATIVE
NITRITE UA: NEGATIVE
Protein, UA: NEGATIVE
SPEC GRAV UA: 1.02 (ref 1.005–1.030)
UUROB: 0.2 mg/dL (ref 0.2–1.0)
pH, UA: 5 (ref 5.0–7.5)

## 2015-04-30 MED ORDER — LIDOCAINE HCL 2 % EX GEL
1.0000 "application " | Freq: Once | CUTANEOUS | Status: AC
  Administered 2015-04-30: 1 via URETHRAL

## 2015-04-30 MED ORDER — CIPROFLOXACIN HCL 500 MG PO TABS
500.0000 mg | ORAL_TABLET | Freq: Once | ORAL | Status: AC
  Administered 2015-04-30: 500 mg via ORAL

## 2015-04-30 NOTE — Progress Notes (Signed)
Patient returns in follow-up for microscopic hematuria. She was evaluated September 2017 when she had a negative urine culture, BUN was 10 and creatinine 0.53. She has symptoms of nocturia with a history of snoring and sleep study was recommended. Repeat urinalysis confirm microscopic hematuria. She underwent a CT hematuria protocol which are reviewed. This was negative apart from a tiny bubble of gas in the bladder. There wasn't a definite plain between the colon and the bladder throughout the entire CT but there is no bladder or colonic wall thickening or sign of diverticulitis.  Today, she is well. She's had no dysuria or gross hematuria. She has noticed some dark urine on occasion but never any red urine or blood clots.  UA clear  PE; NAD Abd - soft, NT, MD, no mass GU - nl vulva. No lesions. Atrophic vaginitis. Chaperone: Maralyn SagoSarah  We discussed the nature risks and benefits of cystoscopy and she elected to proceed. Procedure: Cystoscopy-the patient was placed in lithotomy position and prepped. I had some trouble getting the cystoscope through the meatus and she was gently dilated with a 16 [place] and 18 [place] dilator. The cystoscope was advanced per urethra and the bladder inspected. The scope was removed. She tolerated well. She was covered with Cipro. Findings: On exam the vulva appeared normal. There were no masses. On cystoscopy the urethral meatus was normal, the urethra appeared normal, the trigone and ureteral orifices were in their normal orthotopic position with clear efflux. The bladder mucosa appeared normal. There were no tumors or areas of erythema. There were no stones or foreign bodies in the bladder. Chaperone: Sarah  Assessment/plan: Microscopic hematuria-benign evaluation. Her urine was actually clear today. We'll see her back in one year for urinalysis history and physical. Discussed again the link between nocturia and obstructive sleep apnea

## 2015-05-11 ENCOUNTER — Encounter: Payer: Self-pay | Admitting: Physician Assistant

## 2015-05-11 ENCOUNTER — Ambulatory Visit (INDEPENDENT_AMBULATORY_CARE_PROVIDER_SITE_OTHER): Payer: BLUE CROSS/BLUE SHIELD | Admitting: Physician Assistant

## 2015-05-11 VITALS — BP 148/98 | HR 114 | Temp 98.6°F | Resp 18 | Wt 149.0 lb

## 2015-05-11 DIAGNOSIS — G8929 Other chronic pain: Secondary | ICD-10-CM

## 2015-05-11 DIAGNOSIS — K860 Alcohol-induced chronic pancreatitis: Secondary | ICD-10-CM | POA: Diagnosis not present

## 2015-05-11 DIAGNOSIS — Z79891 Long term (current) use of opiate analgesic: Secondary | ICD-10-CM

## 2015-05-11 MED ORDER — OXYCODONE-ACETAMINOPHEN 5-325 MG PO TABS: 1.0000 | ORAL_TABLET | ORAL | Status: DC | PRN

## 2015-05-11 NOTE — Progress Notes (Signed)
Patient ID: APRYLE Lozano, female   DOB: [birth year], 50 y.o.   MRN: [id number]       Patient: Veronica Lozano Female    DOB: Jun 02, 1965   50 y.o.   MRN: [id number] Visit Date: 05/11/2015  Today's Provider: Margaretann Loveless, PA-C   Chief Complaint  Patient presents with  . Abdominal Pain    with nausea, vomiting x 5 days. Pt has history of chronic pancreaitis   Subjective:    HPI Pt is here today for abdominal pain, nausea and vomiting. She has had symptoms for about 5 days. Abdominal pain is in the middle of her abdomen and runs through to her back. She reports that she has been taking pain medication. She is prescribed oxycodone-APAP 5/325 mg q8h but she has been taking one in between the eight hour mark to try to stay ahead of the extreme pain. She has also been taking her promethazine. She denies fevers or chills. She as history of chronic pancreatitis and believes this is a flare up. She has not been around anyone sick to her knowledge. She has not eaten anything since the day after this started. All she has had is water.     Allergies  Allergen Reactions  . Coconut Oil Hives  . Latex Hives  . Morphine And Related Itching and Rash   Previous Medications   ACYCLOVIR (ZOVIRAX) 800 MG TABLET    Take 800 mg by mouth as needed.   ATORVASTATIN (LIPITOR) 20 MG TABLET    Take 20 mg by mouth daily.   ESZOPICLONE 3 MG TABS    Take 1 tablet (3 mg total) by mouth at bedtime. Take immediately before bedtime   INSULIN ASPART (NOVOLOG) 100 UNIT/ML INJECTION    Inject 19 Units into the skin 3 (three) times daily before meals. Sliding scale   INSULIN GLARGINE (LANTUS) 100 UNIT/ML INJECTION    Inject 0.35 mLs (35 Units total) into the skin at bedtime.   OXYCODONE-ACETAMINOPHEN (ROXICET) 5-325 MG TABLET    Take 1 tablet by mouth every 8 (eight) hours as needed for moderate pain or severe pain.   PANCRELIPASE, LIP-PROT-AMYL, (CREON) 24000 UNITS CPEP    Take 24,000 Units by mouth 3 (three) times  daily with meals. 2 tablets with meals and 1 tablet with snacks   PROMETHAZINE (PHENERGAN) 25 MG TABLET    Take 25 mg by mouth every 8 (eight) hours as needed for nausea or vomiting. Reported on 04/02/2015   RANITIDINE (ZANTAC) 150 MG TABLET    Take 150 mg by mouth as needed. Reported on 04/30/2015    Review of Systems  Constitutional: Negative.   HENT: Negative.   Eyes: Negative.   Respiratory: Negative.   Gastrointestinal: Positive for nausea, vomiting and abdominal pain.  Endocrine: Negative.   Genitourinary: Negative.   Musculoskeletal: Negative.   Skin: Negative.   Allergic/Immunologic: Negative.   Neurological: Negative.   Hematological: Negative.   Psychiatric/Behavioral: Negative.     Social History  Substance Use Topics  . Smoking status: Current Every Day Smoker -- 0.50 packs/day    Types: Cigarettes  . Smokeless tobacco: Never Used  . Alcohol Use: No   Objective:   BP 148/98 mmHg  Pulse 114  Temp(Src) 98.6 F (37 C) (Oral)  Resp 18  Wt 149 lb (67.586 kg)  Physical Exam  Constitutional: She appears well-developed and well-nourished. No distress.  Neck: Normal range of motion. Neck supple. No JVD present. No tracheal deviation present. No thyromegaly  present.  Cardiovascular: Normal rate, regular rhythm and normal heart sounds.  Exam reveals no gallop and no friction rub.   No murmur heard. Pulmonary/Chest: Effort normal and breath sounds normal. No respiratory distress. She has no wheezes. She has no rales.  Abdominal: Soft. Bowel sounds are normal. She exhibits no distension and no mass. There is generalized tenderness. There is no rebound and no guarding.  Generalized pain but worse in epigastric region and bores through to back c/w her chronic pancreatitis pain.  Lymphadenopathy:    She has no cervical adenopathy.  Skin: She is not diaphoretic.  Vitals reviewed.       Assessment & Plan:     1. Alcohol-induced chronic pancreatitis (HCC) Acute episodic  flare of chronic pancreatitis. States she gets a flare about every 3 months. She takes her pain medication and phenergan for relief of symptoms and goes NPO except water until vomiting subsides and pain decreases then she starts incorporating food back slowly.  Will refill pain medication as below. She states she does not need a refill of phenergan at this time. She is to monitor for fevers, chills, confusion.  If any of these occur she is to go to the hospital immediately. She has a return appt with me on 05/22/15.  At that time she will sign a pain contract with me and we will check urine. She is to call the office if symptoms do not improve or worsen before then.  2. Long term current use of opiate analgesic See above medical treatment plan. - oxyCODONE-acetaminophen (ROXICET) 5-325 MG tablet; Take 1 tablet by mouth every 4 (four) hours as needed for moderate pain or severe pain.  Dispense: 120 tablet; Refill: 0  3. Chronic pain See above medical treatment plan. - oxyCODONE-acetaminophen (ROXICET) 5-325 MG tablet; Take 1 tablet by mouth every 4 (four) hours as needed for moderate pain or severe pain.  Dispense: 120 tablet; Refill: 0       Margaretann LovelessJennifer M Emmett Bracknell, PA-C  [facility]

## 2015-05-11 NOTE — Patient Instructions (Signed)

## 2015-05-22 ENCOUNTER — Encounter: Payer: Self-pay | Admitting: Physician Assistant

## 2015-05-22 ENCOUNTER — Ambulatory Visit (INDEPENDENT_AMBULATORY_CARE_PROVIDER_SITE_OTHER): Payer: BLUE CROSS/BLUE SHIELD | Admitting: Physician Assistant

## 2015-05-22 VITALS — BP 96/62 | HR 107 | Temp 98.7°F | Resp 16 | Wt 146.8 lb

## 2015-05-22 DIAGNOSIS — E1042 Type 1 diabetes mellitus with diabetic polyneuropathy: Secondary | ICD-10-CM

## 2015-05-22 DIAGNOSIS — K860 Alcohol-induced chronic pancreatitis: Secondary | ICD-10-CM | POA: Diagnosis not present

## 2015-05-22 DIAGNOSIS — Z0189 Encounter for other specified special examinations: Secondary | ICD-10-CM | POA: Diagnosis not present

## 2015-05-22 NOTE — Progress Notes (Signed)
Subjective:     Patient ID: Veronica Lozano, female   DOB: 1966/02/13, 50 y.o.   MRN: [id number]  Diabetes There are no hypoglycemic associated symptoms. Associated symptoms include polydipsia and polyuria. Pertinent negatives for diabetes include no polyphagia. There are no hypoglycemic complications. Risk factors for coronary artery disease include diabetes mellitus, tobacco exposure and obesity. Current diabetic treatment includes insulin injections. She is compliant with treatment most of the time. She is following a generally healthy diet. She has not had a previous visit with a dietitian. She participates in exercise intermittently.    Diabetes Mellitus Type II, Follow-up:   Patient is present in office today for her one month follow up, last office visit patients Lantus was increased to 45 units at bedtime, patient was referred to Endocrinology for better control. Patient reports good compliance on medication, she states that her average fasting blood sugar in the morning is 189. Patient reports for a week she was off her insulin due to illness.    Lab Results  Component Value Date   HGBA1C >14.0 04/24/2015   HGBA1C 14.0 03/19/2015   HGBA1C 12.5* 10/19/2014    Last seen for diabetes 1 months ago.  Management since then includes increased basal insulin. She reports excellent compliance with treatment. She is not having side effects.  Current symptoms include none and have been improving. Home blood sugar records: fasting range: 189  Episodes of hypoglycemia? no   Current Insulin Regimen: 35 units lantus; Novolog sliding scale Most Recent Eye Exam:  Weight trend: decreasing steadily Prior visit with dietician: yes  Current diet: in general, a "healthy" diet   Current exercise: walking  Pertinent Labs:    Component Value Date/Time   CREATININE 0.53* 04/18/2015 0926   CREATININE 0.60 06/23/2014 2329    Wt Readings from Last 3 Encounters:  05/22/15 146 lb 12.8 oz (66.588  kg)  05/11/15 149 lb (67.586 kg)  04/30/15 149 lb 11.2 oz (67.903 kg)    ------------------------------------------------------------------------     Review of Systems  HENT: Negative.   Eyes: Negative.   Respiratory: Negative.   Cardiovascular: Negative.   Gastrointestinal: Negative.   Endocrine: Positive for polydipsia and polyuria. Negative for cold intolerance, heat intolerance and polyphagia.  Genitourinary: Negative.   Musculoskeletal: Negative.   Skin: Negative.   Neurological: Negative.   Hematological: Negative.   Psychiatric/Behavioral: Negative.    Filed Vitals:   05/22/15 0816  BP: 96/62  Pulse: 107  Temp: 98.7 F (37.1 C)  Resp: 16      Objective:   Physical Exam  Constitutional: She appears well-developed and well-nourished. No distress.  Neck: Normal range of motion. Neck supple. No JVD present. No tracheal deviation present. No thyromegaly present.  Cardiovascular: Normal rate, regular rhythm and normal heart sounds.  Exam reveals no gallop and no friction rub.   No murmur heard. Pulmonary/Chest: Effort normal and breath sounds normal. No respiratory distress. She has no wheezes. She has no rales.  Lymphadenopathy:    She has no cervical adenopathy.  Skin: She is not diaphoretic.  Psychiatric: She has a normal mood and affect. Her behavior is normal. Judgment and thought content normal.  Vitals reviewed.      Assessment:     1. Alcohol-induced chronic pancreatitis (HCC)   2. Encounter for pain management planning   3. Type 1 diabetes mellitus with diabetic polyneuropathy (HCC)       Plan:     1. Alcohol-induced chronic pancreatitis (HCC) Has chronic opioid use  secondary to chronic pancreatitis flares. She comes in today to sign a pain contract with me and to discuss pain management planning. She also was referred to gastroenterology and saw Dr. Servando SnareWohl. She states that she did not have a good experience with him and would like to establish with  another GI doctor in town. I have placed a referral to GI and requested Dr. Mechele CollinElliott or Dr. Marva PandaSkulskie for further evaluation. She is to call the office in the meantime if she has an acute episode prior to her appointment. - Ambulatory referral to Gastroenterology  2. Encounter for pain management planning Pain contract was signed today in the office. We will file this in her record. She does voice understanding of what was in the pain contract including only to get pain medications from me and to only use [facility] on [address] for prescription refills. She recently had her urine screened by another provider and brought these results to me. We will recheck urine after her follow-up with endocrinology.  3. Type 1 diabetes mellitus with diabetic polyneuropathy (HCC) Uncontrolled. She has an appointment with endocrinology in early April. I will follow-up with her after this visit.

## 2015-06-06 ENCOUNTER — Other Ambulatory Visit: Payer: Self-pay | Admitting: Physician Assistant

## 2015-06-06 DIAGNOSIS — G4700 Insomnia, unspecified: Secondary | ICD-10-CM

## 2015-06-06 MED ORDER — ESZOPICLONE 3 MG PO TABS: 3.0000 mg | ORAL_TABLET | Freq: Every day | ORAL | Status: DC

## 2015-06-06 NOTE — Progress Notes (Signed)
Prescription for es[facility]opiclone 3 mg was [facility] to [facility]  [address]. Pharmacy verified with patient.

## 2015-06-06 NOTE — Progress Notes (Signed)
Open in error  -Veronica Lozano

## 2015-06-22 ENCOUNTER — Other Ambulatory Visit: Payer: Self-pay | Admitting: Physician Assistant

## 2015-06-22 DIAGNOSIS — Z79891 Long term (current) use of opiate analgesic: Secondary | ICD-10-CM

## 2015-06-22 DIAGNOSIS — G8929 Other chronic pain: Secondary | ICD-10-CM

## 2015-06-22 MED ORDER — OXYCODONE-ACETAMINOPHEN 5-325 MG PO TABS: 1.0000 | ORAL_TABLET | ORAL | Status: DC | PRN

## 2015-06-29 ENCOUNTER — Encounter: Payer: Self-pay | Admitting: Physician Assistant

## 2015-07-20 ENCOUNTER — Other Ambulatory Visit: Payer: Self-pay | Admitting: Physician Assistant

## 2015-07-20 DIAGNOSIS — Z79891 Long term (current) use of opiate analgesic: Secondary | ICD-10-CM

## 2015-07-20 DIAGNOSIS — G8929 Other chronic pain: Secondary | ICD-10-CM

## 2015-07-20 MED ORDER — OXYCODONE-ACETAMINOPHEN 5-325 MG PO TABS
1.0000 | ORAL_TABLET | ORAL | Status: DC | PRN
Start: 2015-07-20 — End: 2015-08-17

## 2015-08-17 ENCOUNTER — Ambulatory Visit (INDEPENDENT_AMBULATORY_CARE_PROVIDER_SITE_OTHER): Payer: BLUE CROSS/BLUE SHIELD | Admitting: Physician Assistant

## 2015-08-17 ENCOUNTER — Encounter: Payer: Self-pay | Admitting: Physician Assistant

## 2015-08-17 VITALS — BP 124/80 | HR 100 | Temp 98.3°F | Resp 16 | Wt 144.4 lb

## 2015-08-17 DIAGNOSIS — G8929 Other chronic pain: Secondary | ICD-10-CM | POA: Diagnosis not present

## 2015-08-17 DIAGNOSIS — G4700 Insomnia, unspecified: Secondary | ICD-10-CM | POA: Diagnosis not present

## 2015-08-17 DIAGNOSIS — L600 Ingrowing nail: Secondary | ICD-10-CM | POA: Diagnosis not present

## 2015-08-17 DIAGNOSIS — Z79891 Long term (current) use of opiate analgesic: Secondary | ICD-10-CM | POA: Diagnosis not present

## 2015-08-17 MED ORDER — ALPRAZOLAM 1 MG PO TABS: 1.0000 mg | ORAL_TABLET | Freq: Every evening | ORAL | Status: DC | PRN

## 2015-08-17 MED ORDER — CEPHALEXIN 500 MG PO CAPS: 500.0000 mg | ORAL_CAPSULE | Freq: Two times a day (BID) | ORAL | Status: DC

## 2015-08-17 MED ORDER — OXYCODONE-ACETAMINOPHEN 5-325 MG PO TABS: 1.0000 | ORAL_TABLET | ORAL | Status: DC | PRN

## 2015-08-17 NOTE — Progress Notes (Signed)
Patient: Veronica Lozano Female    DOB: 1965-08-07   50 y.o.   MRN: [id number] Visit Date: 08/17/2015  Today's Provider: Margaretann LovelessJennifer M Srishti Strnad, PA-C   Chief Complaint  Patient presents with  . Medication Refill   Subjective:    HPI Patient is here for medication refill. She wants refills on the oxycodone-acetaminophen for her chronic abdominal pain. She only takes as needed when she gets a flare of what is believed to be chronic pancreatitis.  She is also concern about the Lunesta medication. She reports that made her feel like if she was drunk.She couldn't function on the medication, patient stopped the Lunesta.She does state that she has used Xanax successfully in the past. She would only use 1 tablet at bedtime when she felt like she needed it to help her sleep and she noticed she would sleep through the night. She never has issues falling asleep but does have issues staying asleep. She states that she will sleep for approximately 2 hours and then she is up and cannot go back to sleep.  She also has an ingrown nail on the right index finger. She states she has been trying to soak it without success and feels it is infected now. It is tender to the touch, red, swollen and warm. She hit it last night while she was at work and states that some blood and pus did come out from the medial nail line. She denies fevers, chills, nausea or vomiting.    Allergies  Allergen Reactions  . Coconut Oil Hives  . Latex Hives  . Morphine And Related Itching and Rash   Current Meds  Medication Sig  . acyclovir (ZOVIRAX) 800 MG tablet Take 800 mg by mouth as needed.  Marland Kitchen. atorvastatin (LIPITOR) 20 MG tablet Take 20 mg by mouth daily.  . insulin aspart (NOVOLOG) 100 UNIT/ML injection Inject 19 Units into the skin 3 (three) times daily before meals. Sliding scale  . insulin glargine (LANTUS) 100 UNIT/ML injection Inject 0.35 mLs (35 Units total) into the skin at bedtime.  Marland Kitchen. oxyCODONE-acetaminophen  (ROXICET) 5-325 MG tablet Take 1 tablet by mouth every 4 (four) hours as needed for moderate pain or severe pain.  . Pancrelipase, Lip-Prot-Amyl, (CREON) 24000 UNITS CPEP Take 24,000 Units by mouth 3 (three) times daily with meals. 2 tablets with meals and 1 tablet with snacks  . promethazine (PHENERGAN) 25 MG tablet Take 25 mg by mouth every 8 (eight) hours as needed for nausea or vomiting. Reported on 04/02/2015  . ranitidine (ZANTAC) 150 MG tablet Take 150 mg by mouth as needed. Reported on 05/22/2015    Review of Systems  Constitutional: Negative.   Respiratory: Negative.   Cardiovascular: Negative.   Gastrointestinal: Negative.   Psychiatric/Behavioral: Positive for sleep disturbance.    Social History  Substance Use Topics  . Smoking status: Current Every Day Smoker -- 0.50 packs/day    Types: Cigarettes  . Smokeless tobacco: Never Used  . Alcohol Use: No   Objective:   BP 124/80 mmHg  Pulse 100  Temp(Src) 98.3 F (36.8 C) (Oral)  Resp 16  Wt 144 lb 6.4 oz (65.499 kg)  Physical Exam  Constitutional: She appears well-developed and well-nourished. No distress.  Cardiovascular: Normal rate, regular rhythm and normal heart sounds.  Exam reveals no gallop and no friction rub.   No murmur heard. Pulmonary/Chest: Effort normal and breath sounds normal. No respiratory distress. She has no wheezes. She has no rales.  Abdominal: Soft. Normal appearance and bowel sounds are normal. There is tenderness (very mild) in the epigastric area.  Musculoskeletal:       Right hand: She exhibits tenderness and swelling. She exhibits normal range of motion. Normal sensation noted. Normal strength noted.       Hands: Skin: She is not diaphoretic.  Vitals reviewed.       Assessment & Plan:     1. Long term current use of opiate analgesic Stable. Diagnosis pulled for medication refill. Continue current medical treatment plan. - oxyCODONE-acetaminophen (ROXICET) 5-325 MG tablet; Take 1 tablet  by mouth every 4 (four) hours as needed for moderate pain or severe pain.  Dispense: 120 tablet; Refill: 0  2. Chronic pain See above medical treatment plan. - oxyCODONE-acetaminophen (ROXICET) 5-325 MG tablet; Take 1 tablet by mouth every 4 (four) hours as needed for moderate pain or severe pain.  Dispense: 120 tablet; Refill: 0  3. Mixed insomnia She has failed Ambien, Ambien extended release, Lunesta and trazodone. She reports success with Xanax. We will try Xanax as below for her to take 1 tab nightly for sleep. - ALPRAZolam (XANAX) 1 MG tablet; Take 1 tablet (1 mg total) by mouth at bedtime as needed for anxiety.  Dispense: 30 tablet; Refill: 3  4. Ingrown nail We'll treat with Keflex as below. Discussed soaking her finger in warm water and Epsom salts and then pushing the skin back over the nail as well. She may use ibuprofen as needed for discomfort. She is to call if symptoms felt to improve or worsen. - cephALEXin (KEFLEX) 500 MG capsule; Take 1 capsule (500 mg total) by mouth 2 (two) times daily.  Dispense: 10 capsule; Refill: 0       Margaretann LovelessJennifer M Keyanah Kozicki, PA-C  [facility]

## 2015-08-17 NOTE — Patient Instructions (Signed)
Ingrown Toenail  An ingrown toenail occurs when the corner or sides of your toenail grow into the surrounding skin. The big toe is most commonly affected, but it can happen to any of your toes. If your ingrown toenail is not treated, you will be at risk for infection.  CAUSES  This condition may be caused by:  · Wearing shoes that are too small or tight.  · Injury or trauma, such as stubbing your toe or having your toe stepped on.  · Improper cutting or care of your toenails.  · Being born with (congenital) nail or foot abnormalities, such as having a nail that is too big for your toe.  RISK FACTORS  Risk factors for an ingrown toenail include:  · Age. Your nails tend to thicken as you get older, so ingrown nails are more common in older people.  · Diabetes.  · Cutting your toenails incorrectly.  · Blood circulation problems.  SYMPTOMS  Symptoms may include:  · Pain, soreness, or tenderness.  · Redness.  · Swelling.  · Hardening of the skin surrounding the toe.  Your ingrown toenail may be infected if there is fluid, pus, or drainage.  DIAGNOSIS   An ingrown toenail may be diagnosed by medical history and physical exam. If your toenail is infected, your health care provider may test a sample of the drainage.  TREATMENT  Treatment depends on the severity of your ingrown toenail. Some ingrown toenails may be treated at home. More severe or infected ingrown toenails may require surgery to remove all or part of the nail. Infected ingrown toenails may also be treated with antibiotic medicines.  HOME CARE INSTRUCTIONS  · If you were prescribed an antibiotic medicine, finish all of it even if you start to feel better.  · Soak your foot in warm soapy water for 20 minutes, 3 times per day or as directed by your health care provider.  · Carefully lift the edge of the nail away from the sore skin by wedging a small piece of cotton under the corner of the nail. This may help with the pain.  Be careful not to cause more injury  to the area.  · Wear shoes that fit well. If your ingrown toenail is causing you pain, try wearing sandals, if possible.  · Trim your toenails regularly and carefully. Do not cut them in a curved shape. Cut your toenails straight across. This prevents injury to the skin at the corners of the toenail.  · Keep your feet clean and dry.  · If you are having trouble walking and are given crutches by your health care provider, use them as directed.  · Do not pick at your toenail or try to remove it yourself.  · Take medicines only as directed by your health care provider.  · Keep all follow-up visits as directed by your health care provider. This is important.  SEEK MEDICAL CARE IF:  · Your symptoms do not improve with treatment.  SEEK IMMEDIATE MEDICAL CARE IF:  · You have red streaks that start at your foot and go up your leg.  · You have a fever.  · You have increased redness, swelling, or pain.  · You have fluid, blood, or pus coming from your toenail.     This information is not intended to replace advice given to you by your health care provider. Make sure you discuss any questions you have with your health care provider.     Document Released:   02/08/2000 Document Revised: 06/27/2014 Document Reviewed: 01/04/2014  Elsevier Interactive Patient Education ©2016 Elsevier Inc.

## 2015-09-14 ENCOUNTER — Other Ambulatory Visit: Payer: Self-pay | Admitting: Physician Assistant

## 2015-09-14 DIAGNOSIS — Z79891 Long term (current) use of opiate analgesic: Secondary | ICD-10-CM

## 2015-09-14 DIAGNOSIS — G8929 Other chronic pain: Secondary | ICD-10-CM

## 2015-09-14 MED ORDER — OXYCODONE-ACETAMINOPHEN 5-325 MG PO TABS: 1.0000 | ORAL_TABLET | ORAL | Status: DC | PRN

## 2015-10-10 ENCOUNTER — Other Ambulatory Visit: Payer: Self-pay | Admitting: Physician Assistant

## 2015-10-10 DIAGNOSIS — G8929 Other chronic pain: Secondary | ICD-10-CM

## 2015-10-10 DIAGNOSIS — Z79891 Long term (current) use of opiate analgesic: Secondary | ICD-10-CM

## 2015-10-10 MED ORDER — OXYCODONE-ACETAMINOPHEN 5-325 MG PO TABS
1.0000 | ORAL_TABLET | ORAL | 0 refills | Status: DC | PRN
Start: 2015-10-10 — End: 2015-11-14

## 2015-10-10 NOTE — Telephone Encounter (Signed)
Please call patient to notify Rx printed and up front.

## 2015-10-10 NOTE — Telephone Encounter (Signed)
Patient needs a refill on oxyCODONE-acetaminophen (ROXICET) 5-325 MG tablet.  Call patient when ready for pick up.

## 2015-10-10 NOTE — Telephone Encounter (Signed)
Patient advised as directed below.  Thanks,  -Joseline 

## 2015-10-17 ENCOUNTER — Ambulatory Visit: Payer: BLUE CROSS/BLUE SHIELD | Admitting: Physician Assistant

## 2015-10-17 DIAGNOSIS — E782 Mixed hyperlipidemia: Secondary | ICD-10-CM | POA: Insufficient documentation

## 2015-10-17 DIAGNOSIS — F17200 Nicotine dependence, unspecified, uncomplicated: Secondary | ICD-10-CM | POA: Insufficient documentation

## 2015-10-21 ENCOUNTER — Emergency Department: Payer: BLUE CROSS/BLUE SHIELD

## 2015-10-21 ENCOUNTER — Encounter: Payer: Self-pay | Admitting: Emergency Medicine

## 2015-10-21 ENCOUNTER — Emergency Department
Admission: EM | Admit: 2015-10-21 | Discharge: 2015-10-21 | Disposition: A | Discharge status: Home / Self Care | Payer: BLUE CROSS/BLUE SHIELD | Attending: Emergency Medicine | Admitting: Emergency Medicine

## 2015-10-21 DIAGNOSIS — Z5321 Procedure and treatment not carried out due to patient leaving prior to being seen by health care provider: Secondary | ICD-10-CM | POA: Diagnosis not present

## 2015-10-21 DIAGNOSIS — R0602 Shortness of breath: Secondary | ICD-10-CM | POA: Diagnosis not present

## 2015-10-21 DIAGNOSIS — E119 Type 2 diabetes mellitus without complications: Secondary | ICD-10-CM | POA: Diagnosis not present

## 2015-10-21 DIAGNOSIS — Z79899 Other long term (current) drug therapy: Secondary | ICD-10-CM | POA: Diagnosis not present

## 2015-10-21 DIAGNOSIS — I10 Essential (primary) hypertension: Secondary | ICD-10-CM | POA: Diagnosis not present

## 2015-10-21 DIAGNOSIS — M549 Dorsalgia, unspecified: Secondary | ICD-10-CM | POA: Insufficient documentation

## 2015-10-21 DIAGNOSIS — Z794 Long term (current) use of insulin: Secondary | ICD-10-CM | POA: Insufficient documentation

## 2015-10-21 DIAGNOSIS — R112 Nausea with vomiting, unspecified: Secondary | ICD-10-CM | POA: Diagnosis not present

## 2015-10-21 DIAGNOSIS — R0789 Other chest pain: Secondary | ICD-10-CM | POA: Insufficient documentation

## 2015-10-21 DIAGNOSIS — F17210 Nicotine dependence, cigarettes, uncomplicated: Secondary | ICD-10-CM | POA: Insufficient documentation

## 2015-10-21 LAB — COMPREHENSIVE METABOLIC PANEL
ALBUMIN: 4.1 g/dL (ref 3.5–5.0)
ALK PHOS: 90 U/L (ref 38–126)
ALT: 20 U/L (ref 14–54)
ANION GAP: 7 (ref 5–15)
AST: 16 U/L (ref 15–41)
BILIRUBIN TOTAL: 0.9 mg/dL (ref 0.3–1.2)
BUN: 16 mg/dL (ref 6–20)
CALCIUM: 8.9 mg/dL (ref 8.9–10.3)
CO2: 24 mmol/L (ref 22–32)
Chloride: 99 mmol/L — ABNORMAL LOW (ref 101–111)
Creatinine, Ser: 0.4 mg/dL — ABNORMAL LOW (ref 0.44–1.00)
GFR calc non Af Amer: >60 mL/min (ref >60)
Glucose, Bld: 339 mg/dL — ABNORMAL HIGH (ref 65–99)
POTASSIUM: 3.9 mmol/L (ref 3.5–5.1)
SODIUM: 130 mmol/L — AB (ref 135–145)
TOTAL PROTEIN: 7.4 g/dL (ref 6.5–8.1)

## 2015-10-21 LAB — CBC
HEMATOCRIT: 45.7 % (ref 35.0–47.0)
Hemoglobin: 15.9 g/dL (ref 12.0–16.0)
MCH: 31.6 pg (ref 26.0–34.0)
MCHC: 34.9 g/dL (ref 32.0–36.0)
MCV: 90.5 fL (ref 80.0–100.0)
PLATELETS: 213 10*3/uL (ref 150–440)
RBC: 5.04 MIL/uL (ref 3.80–5.20)
RDW: 12.7 % (ref 11.5–14.5)
WBC: 8.4 10*3/uL (ref 3.6–11.0)

## 2015-10-21 LAB — LIPASE, BLOOD: Lipase: 64 U/L — ABNORMAL HIGH (ref 11–51)

## 2015-10-21 LAB — TROPONIN I: Troponin I: <0.03 ng/mL (ref <0.03)

## 2015-10-21 NOTE — ED Notes (Signed)
Urine sent to lab Lm edt  

## 2015-10-21 NOTE — ED Triage Notes (Signed)
Pt states history of pancreatitis, pt states since Tuesday has had left sided chest pressure "like an elephant sitting on my chest" with associated nausea, vomiting, back pain, and shob. Pt states she took phenergan and percocet pta. Skin normal color warm and dry. resps unlabored.

## 2015-10-21 NOTE — ED Notes (Signed)
Spoke with dr. Don Perkingveronese regarding pt's presentation and pain level. No new orders received for pain medication. Pt updated on result of md conversation.

## 2015-10-25 ENCOUNTER — Emergency Department
Admission: EM | Admit: 2015-10-25 | Discharge: 2015-10-25 | Disposition: A | Discharge status: Home / Self Care | Payer: BLUE CROSS/BLUE SHIELD | Attending: Emergency Medicine | Admitting: Emergency Medicine

## 2015-10-25 ENCOUNTER — Encounter: Payer: Self-pay | Admitting: Emergency Medicine

## 2015-10-25 DIAGNOSIS — Z79899 Other long term (current) drug therapy: Secondary | ICD-10-CM | POA: Diagnosis not present

## 2015-10-25 DIAGNOSIS — M549 Dorsalgia, unspecified: Secondary | ICD-10-CM | POA: Diagnosis not present

## 2015-10-25 DIAGNOSIS — F17210 Nicotine dependence, cigarettes, uncomplicated: Secondary | ICD-10-CM | POA: Diagnosis not present

## 2015-10-25 DIAGNOSIS — Z794 Long term (current) use of insulin: Secondary | ICD-10-CM | POA: Insufficient documentation

## 2015-10-25 DIAGNOSIS — E119 Type 2 diabetes mellitus without complications: Secondary | ICD-10-CM | POA: Diagnosis not present

## 2015-10-25 DIAGNOSIS — K861 Other chronic pancreatitis: Secondary | ICD-10-CM | POA: Diagnosis not present

## 2015-10-25 DIAGNOSIS — I10 Essential (primary) hypertension: Secondary | ICD-10-CM | POA: Diagnosis not present

## 2015-10-25 DIAGNOSIS — R079 Chest pain, unspecified: Secondary | ICD-10-CM | POA: Diagnosis present

## 2015-10-25 LAB — CBC WITH DIFFERENTIAL/PLATELET
Basophils Absolute: 0 10*3/uL (ref 0–0.1)
Basophils Relative: 0 %
Eosinophils Absolute: 0.1 10*3/uL (ref 0–0.7)
Eosinophils Relative: 1 %
HEMATOCRIT: 46.6 % (ref 35.0–47.0)
HEMOGLOBIN: 16.8 g/dL — AB (ref 12.0–16.0)
LYMPHS ABS: 2.5 10*3/uL (ref 1.0–3.6)
MCH: 32 pg (ref 26.0–34.0)
MCHC: 36.1 g/dL — AB (ref 32.0–36.0)
MCV: 88.7 fL (ref 80.0–100.0)
Monocytes Absolute: 0.4 10*3/uL (ref 0.2–0.9)
NEUTROS ABS: 5 10*3/uL (ref 1.4–6.5)
Platelets: 214 10*3/uL (ref 150–440)
RBC: 5.25 MIL/uL — ABNORMAL HIGH (ref 3.80–5.20)
RDW: 12.6 % (ref 11.5–14.5)
WBC: 8 10*3/uL (ref 3.6–11.0)

## 2015-10-25 LAB — COMPREHENSIVE METABOLIC PANEL
ALBUMIN: 3.9 g/dL (ref 3.5–5.0)
ALK PHOS: 82 U/L (ref 38–126)
ALT: 16 U/L (ref 14–54)
ANION GAP: 9 (ref 5–15)
AST: 16 U/L (ref 15–41)
BILIRUBIN TOTAL: 0.9 mg/dL (ref 0.3–1.2)
BUN: 11 mg/dL (ref 6–20)
CALCIUM: 9.1 mg/dL (ref 8.9–10.3)
CO2: 24 mmol/L (ref 22–32)
CREATININE: 0.43 mg/dL — AB (ref 0.44–1.00)
Chloride: 98 mmol/L — ABNORMAL LOW (ref 101–111)
GFR calc non Af Amer: >60 mL/min (ref >60)
GLUCOSE: 270 mg/dL — AB (ref 65–99)
Potassium: 3.3 mmol/L — ABNORMAL LOW (ref 3.5–5.1)
Sodium: 131 mmol/L — ABNORMAL LOW (ref 135–145)
TOTAL PROTEIN: 7.6 g/dL (ref 6.5–8.1)

## 2015-10-25 LAB — LIPASE, BLOOD: Lipase: 55 U/L — ABNORMAL HIGH (ref 11–51)

## 2015-10-25 LAB — TROPONIN I: Troponin I: <0.03 ng/mL (ref <0.03)

## 2015-10-25 MED ORDER — HYDROMORPHONE HCL 1 MG/ML IJ SOLN
1.0000 mg | Freq: Once | INTRAMUSCULAR | Status: AC
  Administered 2015-10-25: 1 mg via INTRAVENOUS
  Filled 2015-10-25: qty 1

## 2015-10-25 MED ORDER — HYDROMORPHONE HCL 1 MG/ML IJ SOLN
0.5000 mg | Freq: Once | INTRAMUSCULAR | Status: AC
  Administered 2015-10-25: 0.5 mg via INTRAVENOUS
  Filled 2015-10-25: qty 1

## 2015-10-25 NOTE — ED Triage Notes (Signed)
Pt to ed with c/o chest pain, seen here Sunday for same.  Pt also reports nausea and vomiting.  Pt reports hx of pancreatitis.

## 2015-10-25 NOTE — ED Provider Notes (Signed)
[facility] Emergency Department Provider Note        Time seen: ----------------------------------------- 9:10 AM on 10/25/2015 -----------------------------------------    I have reviewed the triage vital signs and the nursing notes.   HISTORY  Chief Complaint Chest Pain    HPI Veronica Lozano is a 50 y.o. female who presents the ER for chest pain, seen here Sunday for the same. She reports nausea and vomiting, reports chronic pancreatitis. She states she is currently on Percocet long-term for chronic pancreatitis. Patient reports chest pain is worse than normal, upper abdominal pain radiates into the back. Percocet helps her pain but then runs out at about the 3 and half hour mark and that she needs more pain medicine.   Past Medical History:  Diagnosis Date  . Diabetes mellitus without complication (HCC)   . Heartburn   . HLD (hyperlipidemia)   . Hypertension   . Pancreatitis     Patient Active Problem List   Diagnosis Date Noted  . Microscopic hematuria 04/02/2015  . Flank pain 04/02/2015  . Nocturia 04/02/2015  . Long term current use of opiate analgesic 03/21/2015  . Encounter for therapeutic drug level monitoring 03/21/2015  . Chronic abdominal pain (epigastric) (Location of Primary Source of Pain) 03/21/2015  . Encounter for pain management planning 03/21/2015  . Chronic pain 03/21/2015  . Pain management 03/21/2015  . Blood pressure elevated 03/19/2015  . Chronic recurrent pancreatitis (HCC) 03/19/2015  . Type 1 diabetes mellitus (HCC) 03/19/2015  . Insomnia, persistent 10/30/2014    Past Surgical History:  Procedure Laterality Date  . ABDOMINAL HYSTERECTOMY    . APPENDECTOMY    . CARDIAC CATHETERIZATION    . CHOLECYSTECTOMY    . FOOT SURGERY Bilateral    for neuropathy    Allergies Coconut oil; Latex; and Morphine and related  Social History Social History  Substance Use Topics  . Smoking status: Current Every Day  Smoker    Packs/day: 0.50    Types: Cigarettes  . Smokeless tobacco: Never Used  . Alcohol use No    Review of Systems Constitutional: Negative for fever. Cardiovascular: Positive for chest pain Respiratory: Negative for shortness of breath. Gastrointestinal: Positive for abdominal pain, vomiting Genitourinary: Negative for dysuria. Musculoskeletal: Positive for back pain Skin: Negative for rash. Neurological: Negative for headaches, focal weakness or numbness.  10-point ROS otherwise negative.  ____________________________________________   PHYSICAL EXAM:  VITAL SIGNS: ED Triage Vitals [10/25/15 0900]  Enc Vitals Group     BP (!) 127/92     Pulse Rate (!) 117     Resp 20     Temp 98.1 F (36.7 C)     Temp Source Oral     SpO2 96 %     Weight 136 lb (61.7 kg)     Height 4\' 11"  (1.499 m)     Head Circumference      Peak Flow      Pain Score 10     Pain Loc      Pain Edu?      Excl. in GC?     Constitutional: Alert and oriented. Well appearing and in no distress. Eyes: Conjunctivae are normal. PERRL. Normal extraocular movements. ENT   Head: Normocephalic and atraumatic.   Nose: No congestion/rhinnorhea.   Mouth/Throat: Mucous membranes are moist.   Neck: No stridor. Cardiovascular: Normal rate, regular rhythm. No murmurs, rubs, or gallops. Respiratory: Normal respiratory effort without tachypnea nor retractions. Breath sounds are clear and equal bilaterally.  No wheezes/rales/rhonchi. Gastrointestinal: Epigastric tenderness, no rebound or guarding. Normal bowel sounds. Musculoskeletal: Nontender with normal range of motion in all extremities. No lower extremity tenderness nor edema. Neurologic:  Normal speech and language. No gross focal neurologic deficits are appreciated.  Skin:  Skin is warm, dry and intact. No rash noted. Psychiatric: Mood and affect are normal. Speech and behavior are normal.   ____________________________________________  EKG: Interpreted by me. Sinus tachycardia with a rate of 109 bpm, normal PR interval, normal QRS, normal QT interval. Normal axis.  ____________________________________________  ED COURSE:  Pertinent labs & imaging results that were available during my care of the patient were reviewed by me and considered in my medical decision making (see chart for details). Clinical Course  Patient presents to ER with abdominal pain that is likely chronic. We will assess with basic labs and reevaluate.  Procedures ____________________________________________   LABS (pertinent positives/negatives)  Labs Reviewed  CBC WITH DIFFERENTIAL/PLATELET - Abnormal; Notable for the following:       Result Value   RBC 5.25 (*)    Hemoglobin 16.8 (*)    MCHC 36.1 (*)    All other components within normal limits  COMPREHENSIVE METABOLIC PANEL - Abnormal; Notable for the following:    Sodium 131 (*)    Potassium 3.3 (*)    Chloride 98 (*)    Glucose, Bld 270 (*)    Creatinine, Ser 0.43 (*)    All other components within normal limits  LIPASE, BLOOD - Abnormal; Notable for the following:    Lipase 55 (*)    All other components within normal limits  TROPONIN I   ____________________________________________  FINAL ASSESSMENT AND PLAN  Chronic abdominal pain, chronic pancreatitis  Plan: Patient with labs and imaging as dictated above. Patient presents to ER with her chronic abdominal pain. She was given IV Dilaudid with improvement in her symptoms. I cannot prescribe pain medicine for her chronic pain. She is stable for outpatient follow-up with her doctor.   Emily FilbertWilliams, Jonathan E, MD   Note: This dictation was prepared with Dragon dictation. Any transcriptional errors that result from this process are unintentional    Emily FilbertJonathan E Williams, MD 10/25/15 1105

## 2015-10-25 NOTE — ED Notes (Signed)
Nausea, vomiting approx 1 week ago. Runny stools that began yesterday. Abdominal pain.

## 2015-10-25 NOTE — ED Notes (Signed)
MD at bedside. 

## 2015-11-05 ENCOUNTER — Encounter: Payer: Self-pay | Admitting: Physician Assistant

## 2015-11-05 ENCOUNTER — Ambulatory Visit (INDEPENDENT_AMBULATORY_CARE_PROVIDER_SITE_OTHER): Payer: BLUE CROSS/BLUE SHIELD | Admitting: Physician Assistant

## 2015-11-05 VITALS — BP 110/68 | HR 112 | Temp 98.7°F | Resp 16 | Ht 59.0 in | Wt 136.0 lb

## 2015-11-05 DIAGNOSIS — N3000 Acute cystitis without hematuria: Secondary | ICD-10-CM

## 2015-11-05 DIAGNOSIS — E1142 Type 2 diabetes mellitus with diabetic polyneuropathy: Secondary | ICD-10-CM

## 2015-11-05 DIAGNOSIS — Z794 Long term (current) use of insulin: Secondary | ICD-10-CM

## 2015-11-05 DIAGNOSIS — K860 Alcohol-induced chronic pancreatitis: Secondary | ICD-10-CM

## 2015-11-05 DIAGNOSIS — R300 Dysuria: Secondary | ICD-10-CM

## 2015-11-05 DIAGNOSIS — R079 Chest pain, unspecified: Secondary | ICD-10-CM | POA: Insufficient documentation

## 2015-11-05 LAB — POCT URINALYSIS DIPSTICK
BILIRUBIN UA: NEGATIVE
Glucose, UA: 2000
KETONES UA: NEGATIVE
LEUKOCYTES UA: NEGATIVE
Nitrite, UA: NEGATIVE
PROTEIN UA: NEGATIVE
Spec Grav, UA: 1.01
Urobilinogen, UA: 0.2
pH, UA: 6

## 2015-11-05 MED ORDER — DAPAGLIFLOZIN PROPANEDIOL 10 MG PO TABS: 10.0000 mg | ORAL_TABLET | Freq: Every day | ORAL | 0 refills | Status: DC

## 2015-11-05 NOTE — Patient Instructions (Signed)
Dapagliflozin tablets What is this medicine? DAPAGLIFLOZIN (DAP a gli FLOE zin) helps to treat type 2 diabetes. It helps to control blood sugar. Treatment is combined with diet and exercise. This medicine may be used for other purposes; ask your health care provider or pharmacist if you have questions. What should I tell my health care provider before I take this medicine? They need to know if you have any of these conditions: -bladder cancer -dehydration -diabetic ketoacidosis -diet low in salt -eating less due to illness, surgery, dieting, or any other reason -having surgery -high cholesterol -history of pancreatitis or pancreas problems -history of yeast infection of the penis or vagina -if you often drink alcohol -infections in the bladder, kidneys, or urinary tract -kidney disease -low blood pressure -on hemodialysis -problems urinating -type 1 diabetes -uncircumcised female -an unusual or allergic reaction to dapagliflozin, other medicines, foods, dyes, or preservatives -pregnant or trying to get pregnant -breast-feeding How should I use this medicine? Take this medicine by mouth with a glass of water. Follow the directions on the prescription label. You can take it with or without food. If it upsets your stomach, take it with food. Take this medicine in the morning. Take your dose at the same time each day. Do not take more often than directed. Do not stop taking except on your doctor's advice. A special MedGuide will be given to you by the pharmacist with each prescription and refill. Be sure to read this information carefully each time. Talk to your pediatrician regarding the use of this medicine in children. Special care may be needed. Overdosage: If you think you have taken too much of this medicine contact a poison control center or emergency room at once. NOTE: This medicine is only for you. Do not share this medicine with others. What if I miss a dose? If you miss a dose,  take it as soon as you can. If it is almost time for your next dose, take only that dose. Do not take double or extra doses. What may interact with this medicine? Do not take this medicine with any of the following medications: -gatifloxacinThis medicine may also interact with the following medications: -alcohol -certain medicines for blood pressure, heart disease -diuretics -insulin -nateglinide -pioglitazone -quinolone antibiotics like ciprofloxacin, levofloxacin, ofloxacin -repaglinide -some herbal dietary supplements -steroid medicines like prednisone or cortisone -sulfonylureas like glimepiride, glipizide, glyburide -thyroid medicine This list may not describe all possible interactions. Give your health care provider a list of all the medicines, herbs, non-prescription drugs, or dietary supplements you use. Also tell them if you smoke, drink alcohol, or use illegal drugs. Some items may interact with your medicine. What should I watch for while using this medicine? Visit your doctor or health care professional for regular checks on your progress. This medicine can cause a serious condition in which there is too much acid in the blood. If you develop nausea, vomiting, stomach pain, unusual tiredness, or breathing problems, stop taking this medicine and call your doctor right away. If possible, use a ketone dipstick to check for ketones in your urine. A test called the HbA1C (A1C) will be monitored. This is a simple blood test. It measures your blood sugar control over the last 2 to 3 months. You will receive this test every 3 to 6 months. Learn how to check your blood sugar. Learn the symptoms of low and high blood sugar and how to manage them. Always carry a quick-source of sugar with you in case you   have symptoms of low blood sugar. Examples include hard sugar candy or glucose tablets. Make sure others know that you can choke if you eat or drink when you develop serious symptoms of low  blood sugar, such as seizures or unconsciousness. They must get medical help at once. Tell your doctor or health care professional if you have high blood sugar. You might need to change the dose of your medicine. If you are sick or exercising more than usual, you might need to change the dose of your medicine. Do not skip meals. Ask your doctor or health care professional if you should avoid alcohol. Many nonprescription cough and cold products contain sugar or alcohol. These can affect blood sugar. Wear a medical ID bracelet or chain, and carry a card that describes your disease and details of your medicine and dosage times. What side effects may I notice from receiving this medicine? Side effects that you should report to your doctor or health care professional as soon as possible: -allergic reactions like skin rash, itching or hives, swelling of the face, lips, or tongue -breathing problems -dizziness -feeling faint or lightheaded, falls -muscle weakness -nausea, vomiting, unusual stomach upset or pain -signs and symptoms of low blood sugar such as feeling anxious, confusion, dizziness, increased hunger, unusually weak or tired, sweating, shakiness, cold, irritable, headache, blurred vision, fast heartbeat, loss of consciousness -signs and symptoms of a urinary tract infection, such as fever, chills, a burning feeling when urinating, blood in the urine, back pain -trouble passing urine or change in the amount of urine, including an urgent need to urinate more often, in larger amounts, or at night -penile discharge, itching, or pain in men -unusual tiredness -vaginal discharge, itching, or odor in women Side effects that usually do not require medical attention (Report these to your doctor or health care professional if they continue or are bothersome.): -constipation -mild increase in urination -stuffy or runny nose -sore throat -thirsty This list may not describe all possible side  effects. Call your doctor for medical advice about side effects. You may report side effects to FDA at 1-800-FDA-1088. Where should I keep my medicine? Keep out of the reach of children. Store at room temperature between 15 and 30 degrees C (59 and 86 degrees F). Throw away any unused medicine after the expiration date. NOTE: This sheet is a summary. It may not cover all possible information. If you have questions about this medicine, talk to your doctor, pharmacist, or health care provider.    2016, Elsevier/Gold Standard. (2014-01-31 11:14:42)  

## 2015-11-05 NOTE — Progress Notes (Signed)
Patient: Veronica MilchRoma J Pung Female    DOB: 07/11/1965   50 y.o.   MRN: [id number] Visit Date: 11/05/2015  Today's Provider: Margaretann LovelessJennifer M Jaeson Molstad, PA-C   Chief Complaint  Patient presents with  . Dysuria   Subjective:    HPI Patient comes in today c/o burning on urination. Patient reports that it has been going on for about 3 days. Urine had a foul odor Friday and Saturday. She reports that she noticed blood on her tissue this morning and Sunday morning. She reports sensation of feeling like "something coming out of me." No history of renal stones. Also had symptoms of yeast infection on Friday with itching and irritation. She did an OTC monistat treatment which resolved those symptoms. Patient has been taking AZO for her symptoms with mild relief and trying to push fluids. Patient reports that she has had to urinate more frequently, and usually has the pain after urination. This all started following initiation of Jardiance per patient. She has been followed by Veronica Lozano, Endocrine, for uncontrolled T2DM.   She is also recovering from a recent pancreatitis flare. She has seen Veronica SettersKim Mills, NP, GI, and was sent to the ER for chest pain. She was given IV Dilaudid at the hospital with relief. She is scheduled today to see Cardiology to follow up chest pain and elevated BP. She reports symptoms are back to baseline at this time.    Allergies  Allergen Reactions  . Coconut Oil Hives  . Latex Hives  . Morphine And Related Itching and Rash     Current Outpatient Prescriptions:  .  acyclovir (ZOVIRAX) 800 MG tablet, Take 800 mg by mouth as needed., Disp: , Rfl:  .  ALPRAZolam (XANAX) 1 MG tablet, Take 1 tablet (1 mg total) by mouth at bedtime as needed for anxiety., Disp: 30 tablet, Rfl: 3 .  atorvastatin (LIPITOR) 20 MG tablet, Take 20 mg by mouth daily., Disp: , Rfl: 0 .  insulin aspart (NOVOLOG) 100 UNIT/ML injection, Inject 19 Units into the skin 3 (three) times daily before meals.  Sliding scale, Disp: , Rfl:  .  insulin glargine (LANTUS) 100 UNIT/ML injection, Inject 0.35 mLs (35 Units total) into the skin at bedtime., Disp: 10 mL, Rfl: 11 .  oxyCODONE-acetaminophen (ROXICET) 5-325 MG tablet, Take 1 tablet by mouth every 4 (four) hours as needed for moderate pain or severe pain., Disp: 120 tablet, Rfl: 0 .  Pancrelipase, Lip-Prot-Amyl, (CREON) 24000 UNITS CPEP, Take 24,000 Units by mouth 3 (three) times daily with meals. 2 tablets with meals and 1 tablet with snacks, Disp: , Rfl:  .  promethazine (PHENERGAN) 25 MG tablet, Take 25 mg by mouth every 8 (eight) hours as needed for nausea or vomiting. Reported on 04/02/2015, Disp: 20 tablet, Rfl: 0 .  ranitidine (ZANTAC) 150 MG tablet, Take 150 mg by mouth as needed. Reported on 05/22/2015, Disp: , Rfl:   Review of Systems  Constitutional: Negative for activity change, appetite change, chills, diaphoresis, fatigue, fever and unexpected weight change.  Respiratory: Negative for cough, chest tightness and shortness of breath.   Cardiovascular: Negative for chest pain, palpitations and leg swelling.  Gastrointestinal: Positive for abdominal pain (lower) and nausea (has chronic nausea after eating). Negative for blood in stool, constipation and diarrhea.  Genitourinary: Positive for difficulty urinating, dysuria and hematuria. Negative for decreased urine volume, flank pain, frequency, pelvic pain, urgency, vaginal bleeding, vaginal discharge and vaginal pain.  Musculoskeletal: Positive for back pain. Negative  for gait problem and myalgias.  Neurological: Negative for dizziness, light-headedness and headaches.    Social History  Substance Use Topics  . Smoking status: Current Every Day Smoker    Packs/day: 0.50    Types: Cigarettes  . Smokeless tobacco: Never Used  . Alcohol use No   Objective:   BP 110/68   Pulse (!) 112   Temp 98.7 F (37.1 C)   Resp 16   Ht 4\' 11"  (1.499 m)   Wt 136 lb (61.7 kg)   BMI 27.47 kg/m    Physical Exam  Constitutional: She is oriented to person, place, and time. She appears well-developed and well-nourished. No distress.  Cardiovascular: Normal rate, regular rhythm and normal heart sounds.  Exam reveals no gallop and no friction rub.   No murmur heard. Pulmonary/Chest: Effort normal and breath sounds normal. No respiratory distress. She has no wheezes. She has no rales.  Abdominal: Soft. Normal appearance and bowel sounds are normal. She exhibits no distension and no mass. There is no hepatosplenomegaly. There is tenderness in the suprapubic area. There is no rebound, no guarding and no CVA tenderness.  Neurological: She is alert and oriented to person, place, and time.  Skin: Skin is warm and dry. She is not diaphoretic.      Assessment & Plan:     1. Dysuria UA was only positive for glucosuria, which is to be expected from Veronica Lozano. Will send for culture as AZO has a tendency to disrupt UA readings. Will f/u pending C&S results. - Urine Culture - POCT Urinalysis Dipstick  2. Type 2 diabetes mellitus with diabetic polyneuropathy, with long-term current use of insulin (HCC) Patient has already discontinued Jardiance due to side effects. She has not taken the medicine for 2 days and reports the urinary symptoms improving. She did mention that her fasting sugars were improving significantly with the medication. Knowing the patient, I know she will not restart Jardiance. I discussed trying another medication in the same class since it had been helping. She agrees. She is to call the office if she continues urinary symptoms or develops mycotic infections. I will let Dr. Tedd Sias know of medication change. May be better candidate for DDP4 or GLT1 if tolerable with pancreatitis.   - dapagliflozin propanediol (FARXIGA) 10 MG TABS tablet; Take 10 mg by mouth daily.  Dispense: 21 tablet; Refill: 0  3. Alcohol-induced chronic pancreatitis (HCC) Currently back to baseline.        Veronica Loveless, PA-C  [facility]

## 2015-11-07 LAB — URINE CULTURE

## 2015-11-07 MED ORDER — CIPROFLOXACIN HCL 500 MG PO TABS: 500.0000 mg | ORAL_TABLET | Freq: Two times a day (BID) | ORAL | 0 refills | Status: DC

## 2015-11-07 NOTE — Addendum Note (Signed)
Addended by: Margaretann LovelessBURNETTE, Kamoria Lucien M on: 11/07/2015 01:45 PM   Modules accepted: Orders

## 2015-11-09 ENCOUNTER — Telehealth: Payer: Self-pay | Admitting: Physician Assistant

## 2015-11-09 NOTE — Telephone Encounter (Signed)
error 

## 2015-11-14 ENCOUNTER — Other Ambulatory Visit: Payer: Self-pay | Admitting: Physician Assistant

## 2015-11-14 DIAGNOSIS — G8929 Other chronic pain: Secondary | ICD-10-CM

## 2015-11-14 DIAGNOSIS — Z79891 Long term (current) use of opiate analgesic: Secondary | ICD-10-CM

## 2015-11-14 MED ORDER — OXYCODONE-ACETAMINOPHEN 5-325 MG PO TABS: 1.0000 | ORAL_TABLET | ORAL | 0 refills | Status: DC | PRN

## 2015-11-14 NOTE — Progress Notes (Signed)
Patient requesting refills of oxy-apap.

## 2015-11-16 ENCOUNTER — Ambulatory Visit: Payer: BLUE CROSS/BLUE SHIELD | Admitting: Physician Assistant

## 2015-12-14 ENCOUNTER — Other Ambulatory Visit: Payer: Self-pay | Admitting: Physician Assistant

## 2015-12-14 DIAGNOSIS — G894 Chronic pain syndrome: Secondary | ICD-10-CM

## 2015-12-14 DIAGNOSIS — Z79891 Long term (current) use of opiate analgesic: Secondary | ICD-10-CM

## 2015-12-14 DIAGNOSIS — G4700 Insomnia, unspecified: Secondary | ICD-10-CM

## 2015-12-14 MED ORDER — OXYCODONE-ACETAMINOPHEN 5-325 MG PO TABS: 1.0000 | ORAL_TABLET | ORAL | 0 refills | Status: DC | PRN

## 2015-12-14 MED ORDER — ACYCLOVIR 800 MG PO TABS
800.0000 mg | ORAL_TABLET | ORAL | 3 refills | Status: DC | PRN
Start: 2015-12-14 — End: 2017-03-28

## 2015-12-14 MED ORDER — ALPRAZOLAM 1 MG PO TABS: 1.0000 mg | ORAL_TABLET | Freq: Every evening | ORAL | 5 refills | Status: DC | PRN

## 2015-12-14 NOTE — Progress Notes (Signed)
Pain medication and xanax refilled.

## 2016-01-09 ENCOUNTER — Other Ambulatory Visit: Payer: Self-pay | Admitting: Physician Assistant

## 2016-01-09 DIAGNOSIS — Z1231 Encounter for screening mammogram for malignant neoplasm of breast: Secondary | ICD-10-CM

## 2016-01-14 ENCOUNTER — Other Ambulatory Visit: Payer: Self-pay | Admitting: Physician Assistant

## 2016-01-14 DIAGNOSIS — G894 Chronic pain syndrome: Secondary | ICD-10-CM

## 2016-01-14 DIAGNOSIS — Z79891 Long term (current) use of opiate analgesic: Secondary | ICD-10-CM

## 2016-01-14 MED ORDER — OXYCODONE-ACETAMINOPHEN 5-325 MG PO TABS: 1.0000 | ORAL_TABLET | ORAL | 0 refills | Status: DC | PRN

## 2016-01-14 NOTE — Progress Notes (Signed)
Roxicet refilled

## 2016-02-11 ENCOUNTER — Telehealth: Payer: Self-pay | Admitting: Physician Assistant

## 2016-02-11 DIAGNOSIS — Z79891 Long term (current) use of opiate analgesic: Secondary | ICD-10-CM

## 2016-02-11 DIAGNOSIS — G894 Chronic pain syndrome: Secondary | ICD-10-CM

## 2016-02-11 MED ORDER — OXYCODONE-ACETAMINOPHEN 5-325 MG PO TABS: 1.0000 | ORAL_TABLET | ORAL | 0 refills | Status: DC | PRN

## 2016-02-11 NOTE — Telephone Encounter (Signed)
Refill printed 

## 2016-02-11 NOTE — Telephone Encounter (Signed)
Patient is requesting refill on oxyCODONE-acetaminophen (ROXICET) 5-325 MG tablet

## 2016-02-19 ENCOUNTER — Ambulatory Visit
Admission: RE | Admit: 2016-02-19 | Discharge: 2016-02-19 | Disposition: A | Discharge status: Home / Self Care | Payer: BLUE CROSS/BLUE SHIELD | Source: Ambulatory Visit | Attending: Physician Assistant | Admitting: Physician Assistant

## 2016-02-19 ENCOUNTER — Telehealth: Payer: Self-pay

## 2016-02-19 DIAGNOSIS — Z1231 Encounter for screening mammogram for malignant neoplasm of breast: Secondary | ICD-10-CM | POA: Insufficient documentation

## 2016-02-19 NOTE — Telephone Encounter (Signed)
Pt advised. Emily Drozdowski, CMA  

## 2016-02-19 NOTE — Telephone Encounter (Signed)
-----   Message from Margaretann LovelessJennifer M Burnette, [place]PA-C sent at 02/19/2016 12:35 PM EST ----- Normal mammogram. Repeat screening in one year.

## 2016-02-29 LAB — HM DIABETES FOOT EXAM

## 2016-03-17 ENCOUNTER — Other Ambulatory Visit: Payer: Self-pay | Admitting: Physician Assistant

## 2016-03-17 DIAGNOSIS — G894 Chronic pain syndrome: Secondary | ICD-10-CM

## 2016-03-17 DIAGNOSIS — Z79891 Long term (current) use of opiate analgesic: Secondary | ICD-10-CM

## 2016-03-17 MED ORDER — OXYCODONE-ACETAMINOPHEN 5-325 MG PO TABS
1.0000 | ORAL_TABLET | ORAL | 0 refills | Status: DC | PRN
Start: 2016-03-17 — End: 2016-04-15

## 2016-03-17 NOTE — Telephone Encounter (Signed)
Please review. Veronica Lozano, CMA  

## 2016-03-17 NOTE — Telephone Encounter (Signed)
Pt contacted office for refill request on the following medications: oxyCODONE-acetaminophen (ROXICET) 5-325 MG tablet Last Rx: 02/11/16  Pt would also like to see if she can be worked in Wednesday 03/19/16 around 9 am since she will be here for her husband's appt with Antony ContrasJenni at 9 am that day. Pt stated that she has cough and congestion for the last week. Due to her husband having multiple appt she would like to be seen at the same time if possible. Please advise. Thanks TNP

## 2016-03-17 NOTE — Telephone Encounter (Signed)
Yes that is fine since he is in a 45 min slot. They can put her at 0930.

## 2016-03-19 ENCOUNTER — Encounter: Payer: Self-pay | Admitting: Physician Assistant

## 2016-03-19 ENCOUNTER — Ambulatory Visit (INDEPENDENT_AMBULATORY_CARE_PROVIDER_SITE_OTHER): Payer: BLUE CROSS/BLUE SHIELD | Admitting: Physician Assistant

## 2016-03-19 VITALS — BP 110/68 | HR 102 | Temp 97.8°F | Resp 16 | Wt 149.4 lb

## 2016-03-19 DIAGNOSIS — Z716 Tobacco abuse counseling: Secondary | ICD-10-CM

## 2016-03-19 DIAGNOSIS — Z720 Tobacco use: Secondary | ICD-10-CM

## 2016-03-19 DIAGNOSIS — F5101 Primary insomnia: Secondary | ICD-10-CM

## 2016-03-19 DIAGNOSIS — R05 Cough: Secondary | ICD-10-CM | POA: Diagnosis not present

## 2016-03-19 DIAGNOSIS — R059 Cough, unspecified: Secondary | ICD-10-CM

## 2016-03-19 DIAGNOSIS — J069 Acute upper respiratory infection, unspecified: Secondary | ICD-10-CM | POA: Diagnosis not present

## 2016-03-19 MED ORDER — VARENICLINE TARTRATE 0.5 MG X 11 & 1 MG X 42 PO MISC: ORAL | 0 refills | Status: DC

## 2016-03-19 MED ORDER — AMOXICILLIN-POT CLAVULANATE 875-125 MG PO TABS: 1.0000 | ORAL_TABLET | Freq: Two times a day (BID) | ORAL | 0 refills | Status: DC

## 2016-03-19 MED ORDER — BENZONATATE 200 MG PO CAPS: 200.0000 mg | ORAL_CAPSULE | Freq: Three times a day (TID) | ORAL | 0 refills | Status: DC | PRN

## 2016-03-19 MED ORDER — CLONAZEPAM 1 MG PO TABS: 1.0000 mg | ORAL_TABLET | Freq: Every day | ORAL | 0 refills | Status: DC

## 2016-03-19 NOTE — Progress Notes (Signed)
Patient: Veronica Lozano Female    DOB: Dec 27, 1965   51 y.o.   MRN: [id number] Visit Date: 03/19/2016  Today's Provider: Margaretann LovelessJennifer M Chaniah Cisse, PA-C   No chief complaint on file.  Subjective:    URI   This is a new problem. The current episode started 1 to 4 weeks ago. The problem has been unchanged. There has been no fever. Associated symptoms include congestion, coughing, headaches, a plugged ear sensation, rhinorrhea, sneezing and a sore throat. Pertinent negatives include no abdominal pain, chest pain, ear pain, vomiting or wheezing. Neck pain: right ear. She has tried nothing for the symptoms.      Allergies  Allergen Reactions  . Coconut Oil Hives  . Empagliflozin     Other reaction(s): Other (See Comments) Yeast infection  . Glipizide Hives  . Latex Hives  . Metformin Hives  . Pantoprazole     Other reaction(s): Vomiting  . Morphine And Related Itching and Rash     Current Outpatient Prescriptions:  .  acyclovir (ZOVIRAX) 800 MG tablet, Take 1 tablet (800 mg total) by mouth as needed., Disp: 90 tablet, Rfl: 3 .  ALPRAZolam (XANAX) 1 MG tablet, Take 1 tablet (1 mg total) by mouth at bedtime as needed for anxiety., Disp: 30 tablet, Rfl: 5 .  atorvastatin (LIPITOR) 20 MG tablet, Take 20 mg by mouth daily., Disp: , Rfl: 0 .  insulin aspart (NOVOLOG) 100 UNIT/ML injection, Inject 19 Units into the skin 3 (three) times daily before meals. Sliding scale, Disp: , Rfl:  .  insulin glargine (LANTUS) 100 UNIT/ML injection, Inject 0.35 mLs (35 Units total) into the skin at bedtime., Disp: 10 mL, Rfl: 11 .  oxyCODONE-acetaminophen (ROXICET) 5-325 MG tablet, Take 1 tablet by mouth every 4 (four) hours as needed for moderate pain or severe pain., Disp: 120 tablet, Rfl: 0 .  Pancrelipase, Lip-Prot-Amyl, (CREON) 24000 UNITS CPEP, Take 24,000 Units by mouth 3 (three) times daily with meals. 2 tablets with meals and 1 tablet with snacks, Disp: , Rfl:  .  pioglitazone (ACTOS) 30 MG  tablet, Take by mouth., Disp: , Rfl:  .  ciprofloxacin (CIPRO) 500 MG tablet, Take 1 tablet (500 mg total) by mouth 2 (two) times daily. (Patient not taking: Reported on 03/19/2016), Disp: 14 tablet, Rfl: 0 .  promethazine (PHENERGAN) 25 MG tablet, Take 25 mg by mouth every 8 (eight) hours as needed for nausea or vomiting. Reported on 04/02/2015, Disp: 20 tablet, Rfl: 0 .  ranitidine (ZANTAC) 150 MG tablet, Take 150 mg by mouth as needed. Reported on 05/22/2015, Disp: , Rfl:   Review of Systems  Constitutional: Negative for appetite change, chills and fever.  HENT: Positive for congestion, rhinorrhea, sneezing and sore throat. Negative for ear pain and sinus pressure.   Respiratory: Positive for cough. Negative for chest tightness, shortness of breath and wheezing.   Cardiovascular: Negative for chest pain.  Gastrointestinal: Negative for abdominal pain and vomiting.  Musculoskeletal: Neck pain: right ear.  Neurological: Positive for headaches.    Social History  Substance Use Topics  . Smoking status: Current Every Day Smoker    Packs/day: 0.50    Types: Cigarettes  . Smokeless tobacco: Never Used  . Alcohol use No   Objective:   BP 110/68 (BP Location: Right Arm, Patient Position: Sitting, Cuff Size: Normal)   Pulse (!) 102   Temp 97.8 F (36.6 C) (Oral)   Resp 16   Wt 149 lb 6.4 oz (  67.8 kg)   SpO2 97%   BMI 30.18 kg/m   Physical Exam  Constitutional: She appears well-developed and well-nourished. No distress.  HENT:  Head: Normocephalic and atraumatic.  Right Ear: Hearing, tympanic membrane, external ear and ear canal normal.  Left Ear: Hearing, tympanic membrane, external ear and ear canal normal.  Nose: Mucosal edema and rhinorrhea present. Right sinus exhibits no maxillary sinus tenderness and no frontal sinus tenderness. Left sinus exhibits no maxillary sinus tenderness and no frontal sinus tenderness.  Mouth/Throat: Uvula is midline, oropharynx is clear and moist and  mucous membranes are normal. No oropharyngeal exudate, posterior oropharyngeal edema or posterior oropharyngeal erythema.  Eyes: Conjunctivae are normal. Pupils are equal, round, and reactive to light. Right eye exhibits no discharge. Left eye exhibits no discharge. No scleral icterus.  Neck: Normal range of motion. Neck supple. No tracheal deviation present. No thyromegaly present.  Cardiovascular: Normal rate, regular rhythm and normal heart sounds.  Exam reveals no gallop and no friction rub.   No murmur heard. Pulmonary/Chest: Effort normal and breath sounds normal. No stridor. No respiratory distress. She has no wheezes. She has no rales.  Lymphadenopathy:    She has no cervical adenopathy.  Skin: Skin is warm and dry. She is not diaphoretic.  Vitals reviewed.       Assessment & Plan:     1. Upper respiratory tract infection, unspecified type Worsening symptoms that have not responded to OTC medications. Will give augmentin as below. May use Mucinex for congestion. Stay well hydrated and get plenty of rest. Call if no symptom improvement or if symptoms worsen. - amoxicillin-clavulanate (AUGMENTIN) 875-125 MG tablet; Take 1 tablet by mouth 2 (two) times daily.  Dispense: 20 tablet; Refill: 0  2. Cough Tessalon perles given for cough. Stay well hydrated. Call if symptoms worsen. - benzonatate (TESSALON) 200 MG capsule; Take 1 capsule (200 mg total) by mouth 3 (three) times daily as needed for cough.  Dispense: 30 capsule; Refill: 0  3. Primary insomnia Clonazepam given for sleep. She is to call if ineffective.  - clonazePAM (KLONOPIN) 1 MG tablet; Take 1 tablet (1 mg total) by mouth at bedtime.  Dispense: 30 tablet; Refill: 0  4. Encounter for smoking cessation counseling Patient is wishing to attempt to quit smoking. Discussed smoking cessation and pharmaceutical options. She wishes to try Chantix. She has used in the past and tolerated. She will call if successful the 1st month and  wants to continue the continuation pack. - varenicline (CHANTIX PAK) 0.5 MG X 11 & 1 MG X 42 tablet; Take one 0.5 mg tablet by mouth once daily for 3 days, then increase to one 0.5 mg tablet twice daily for 4 days, then increase to one 1 mg tablet twice daily.  Dispense: 53 tablet; Refill: 0       Margaretann Loveless, PA-C  [facility]

## 2016-03-19 NOTE — Patient Instructions (Signed)
Upper Respiratory Infection, Adult Most upper respiratory infections (URIs) are a viral infection of the air passages leading to the lungs. A URI affects the nose, throat, and upper air passages. The most common type of URI is nasopharyngitis and is typically referred to as "the common cold." URIs run their course and usually go away on their own. Most of the time, a URI does not require medical attention, but sometimes a bacterial infection in the upper airways can follow a viral infection. This is called a secondary infection. Sinus and middle ear infections are common types of secondary upper respiratory infections. Bacterial pneumonia can also complicate a URI. A URI can worsen asthma and chronic obstructive pulmonary disease (COPD). Sometimes, these complications can require emergency medical care and may be life threatening. What are the causes? Almost all URIs are caused by viruses. A virus is a type of germ and can spread from one person to another. What increases the risk? You may be at risk for a URI if:  You smoke.  You have chronic heart or lung disease.  You have a weakened defense (immune) system.  You are very young or very old.  You have nasal allergies or asthma.  You work in crowded or poorly ventilated areas.  You work in health care facilities or schools.  What are the signs or symptoms? Symptoms typically develop 2-3 days after you come in contact with a cold virus. Most viral URIs last 7-10 days. However, viral URIs from the influenza virus (flu virus) can last 14-18 days and are typically more severe. Symptoms may include:  Runny or stuffy (congested) nose.  Sneezing.  Cough.  Sore throat.  Headache.  Fatigue.  Fever.  Loss of appetite.  Pain in your forehead, behind your eyes, and over your cheekbones (sinus pain).  Muscle aches.  How is this diagnosed? Your health care provider may diagnose a URI by:  Physical exam.  Tests to check that your  symptoms are not due to another condition such as: ? Strep throat. ? Sinusitis. ? Pneumonia. ? Asthma.  How is this treated? A URI goes away on its own with time. It cannot be cured with medicines, but medicines may be prescribed or recommended to relieve symptoms. Medicines may help:  Reduce your fever.  Reduce your cough.  Relieve nasal congestion.  Follow these instructions at home:  Take medicines only as directed by your health care provider.  Gargle warm saltwater or take cough drops to comfort your throat as directed by your health care provider.  Use a warm mist humidifier or inhale steam from a shower to increase air moisture. This may make it easier to breathe.  Drink enough fluid to keep your urine clear or pale yellow.  Eat soups and other clear broths and maintain good nutrition.  Rest as needed.  Return to work when your temperature has returned to normal or as your health care provider advises. You may need to stay home longer to avoid infecting others. You can also use a face mask and careful hand washing to prevent spread of the virus.  Increase the usage of your inhaler if you have asthma.  Do not use any tobacco products, including cigarettes, chewing tobacco, or electronic cigarettes. If you need help quitting, ask your health care provider. How is this prevented? The best way to protect yourself from getting a cold is to practice good hygiene.  Avoid oral or hand contact with people with cold symptoms.  Wash your   hands often if contact occurs.  There is no clear evidence that vitamin C, vitamin E, echinacea, or exercise reduces the chance of developing a cold. However, it is always recommended to get plenty of rest, exercise, and practice good nutrition. Contact a health care provider if:  You are getting worse rather than better.  Your symptoms are not controlled by medicine.  You have chills.  You have worsening shortness of breath.  You have  brown or red mucus.  You have yellow or brown nasal discharge.  You have pain in your face, especially when you bend forward.  You have a fever.  You have swollen neck glands.  You have pain while swallowing.  You have white areas in the back of your throat. Get help right away if:  You have severe or persistent: ? Headache. ? Ear pain. ? Sinus pain. ? Chest pain.  You have chronic lung disease and any of the following: ? Wheezing. ? Prolonged cough. ? Coughing up blood. ? A change in your usual mucus.  You have a stiff neck.  You have changes in your: ? Vision. ? Hearing. ? Thinking. ? Mood. This information is not intended to replace advice given to you by your health care provider. Make sure you discuss any questions you have with your health care provider. Document Released: 08/06/2000 Document Revised: 10/14/2015 Document Reviewed: 05/18/2013 Elsevier Interactive Patient Education  2017 [employer]  

## 2016-04-04 ENCOUNTER — Telehealth: Payer: Self-pay | Admitting: Physician Assistant

## 2016-04-04 DIAGNOSIS — F5101 Primary insomnia: Secondary | ICD-10-CM

## 2016-04-04 LAB — HEMOGLOBIN A1C: Hemoglobin A1C: 7

## 2016-04-04 MED ORDER — ZALEPLON 5 MG PO CAPS: 5.0000 mg | ORAL_CAPSULE | Freq: Every evening | ORAL | 3 refills | Status: DC | PRN

## 2016-04-04 NOTE — Telephone Encounter (Signed)
Called in new medication. Pt advised. Allene DillonEmily Drozdowski, CMA

## 2016-04-04 NOTE — Telephone Encounter (Signed)
Please advise. Emily Drozdowski, CMA  

## 2016-04-04 NOTE — Telephone Encounter (Signed)
Will try zaleplon.  Please call in zaleplon 5mg  capsule to take 1 capsule at bedtime prn sleep #[phone] to [facility] [address]

## 2016-04-04 NOTE — Telephone Encounter (Signed)
Pt called saying the   clonazePAM (KLONOPIN) 1 MG tablet  Is not working for her sleeplessness.  Pt's [phone]  Thanks Barth Kirkseri

## 2016-04-07 ENCOUNTER — Encounter: Payer: Self-pay | Admitting: Physician Assistant

## 2016-04-07 ENCOUNTER — Ambulatory Visit (INDEPENDENT_AMBULATORY_CARE_PROVIDER_SITE_OTHER): Payer: BLUE CROSS/BLUE SHIELD | Admitting: Physician Assistant

## 2016-04-07 VITALS — BP 100/70 | HR 128 | Temp 102.3°F | Resp 20 | Wt 154.0 lb

## 2016-04-07 DIAGNOSIS — R05 Cough: Secondary | ICD-10-CM

## 2016-04-07 DIAGNOSIS — R059 Cough, unspecified: Secondary | ICD-10-CM

## 2016-04-07 DIAGNOSIS — J111 Influenza due to unidentified influenza virus with other respiratory manifestations: Secondary | ICD-10-CM | POA: Diagnosis not present

## 2016-04-07 MED ORDER — OSELTAMIVIR PHOSPHATE 75 MG PO CAPS: 75.0000 mg | ORAL_CAPSULE | Freq: Two times a day (BID) | ORAL | 0 refills | Status: DC

## 2016-04-07 MED ORDER — HYDROCOD POLST-CPM POLST ER 10-8 MG/5ML PO SUER: 5.0000 mL | Freq: Two times a day (BID) | ORAL | 0 refills | Status: DC | PRN

## 2016-04-07 NOTE — Progress Notes (Signed)
Patient: Veronica MilchRoma J Dieu Female    DOB: December 22, 1965   51 y.o.   MRN: [id number] Visit Date: 04/07/2016  Today's Provider: Margaretann LovelessJennifer M Burnette, PA-C   Chief Complaint  Patient presents with  . URI   Subjective:    HPI Upper Respiratory Infection: Patient complains of symptoms of a URI, fever. Symptoms include cough and fever. Onset of symptoms was 1 day ago, gradually worsening since that time. She also c/o achiness and fever 103.8 for the past 1 day .  She is drinking plenty of fluids. Evaluation to date: none. Treatment to date: cough suppressants and Tylenol.     Allergies  Allergen Reactions  . Coconut Oil Hives  . Dapagliflozin     Other reaction(s): Other (See Comments) Yeast infection  . Empagliflozin     Other reaction(s): Other (See Comments) Yeast infection  . Glipizide Hives  . Latex Hives  . Metformin Hives  . Pantoprazole     Other reaction(s): Vomiting  . Morphine And Related Itching and Rash     Current Outpatient Prescriptions:  .  acyclovir (ZOVIRAX) 800 MG tablet, Take 1 tablet (800 mg total) by mouth as needed., Disp: 90 tablet, Rfl: 3 .  atorvastatin (LIPITOR) 20 MG tablet, Take 20 mg by mouth daily., Disp: , Rfl: 0 .  clonazePAM (KLONOPIN) 1 MG tablet, Take 1 tablet (1 mg total) by mouth at bedtime., Disp: 30 tablet, Rfl: 0 .  gabapentin (NEURONTIN) 300 MG capsule, Take 300 mg by mouth at bedtime. , Disp: , Rfl: 0 .  insulin aspart (NOVOLOG) 100 UNIT/ML injection, Inject 19 Units into the skin 3 (three) times daily before meals. Sliding scale, Disp: , Rfl:  .  insulin glargine (LANTUS) 100 UNIT/ML injection, Inject 0.35 mLs (35 Units total) into the skin at bedtime., Disp: 10 mL, Rfl: 11 .  oxyCODONE-acetaminophen (ROXICET) 5-325 MG tablet, Take 1 tablet by mouth every 4 (four) hours as needed for moderate pain or severe pain., Disp: 120 tablet, Rfl: 0 .  Pancrelipase, Lip-Prot-Amyl, (CREON) 24000 UNITS CPEP, Take 24,000 Units by mouth 3 (three)  times daily with meals. 2 tablets with meals and 1 tablet with snacks, Disp: , Rfl:  .  pioglitazone (ACTOS) 30 MG tablet, Take 30 mg by mouth daily. , Disp: , Rfl:  .  promethazine (PHENERGAN) 25 MG tablet, Take 25 mg by mouth every 8 (eight) hours as needed for nausea or vomiting. Reported on 04/02/2015, Disp: 20 tablet, Rfl: 0 .  varenicline (CHANTIX PAK) 0.5 MG X 11 & 1 MG X 42 tablet, Take one 0.5 mg tablet by mouth once daily for 3 days, then increase to one 0.5 mg tablet twice daily for 4 days, then increase to one 1 mg tablet twice daily., Disp: 53 tablet, Rfl: 0 .  zaleplon (SONATA) 5 MG capsule, Take 1 capsule (5 mg total) by mouth at bedtime as needed for sleep., Disp: 30 capsule, Rfl: 3 .  benzonatate (TESSALON) 200 MG capsule, Take 1 capsule (200 mg total) by mouth 3 (three) times daily as needed for cough. (Patient not taking: Reported on 04/07/2016), Disp: 30 capsule, Rfl: 0  Review of Systems  Constitutional: Positive for chills, fatigue and fever.  HENT: Positive for congestion and sore throat. Negative for postnasal drip, rhinorrhea, sinus pain, sinus pressure and trouble swallowing.   Respiratory: Positive for cough. Negative for chest tightness, shortness of breath and wheezing.   Cardiovascular: Negative for chest pain, palpitations and leg swelling.  Gastrointestinal: Positive for nausea. Negative for abdominal pain.  Neurological: Positive for headaches. Negative for dizziness, light-headedness and numbness.    Social History  Substance Use Topics  . Smoking status: Current Every Day Smoker    Packs/day: 0.50    Types: Cigarettes  . Smokeless tobacco: Never Used  . Alcohol use No   Objective:   BP 100/70 (BP Location: Right Arm, Patient Position: Sitting, Cuff Size: Large)   Pulse (!) 128   Temp (!) 102.3 F (39.1 C) (Oral)   Resp 20   Wt 154 lb (69.9 kg)   SpO2 96%   BMI 31.10 kg/m   Physical Exam  Constitutional: She appears well-developed and well-nourished.  No distress.  HENT:  Head: Normocephalic and atraumatic.  Right Ear: Hearing, tympanic membrane, external ear and ear canal normal.  Left Ear: Hearing, tympanic membrane, external ear and ear canal normal.  Nose: Nose normal.  Mouth/Throat: Uvula is midline, oropharynx is clear and moist and mucous membranes are normal. No oropharyngeal exudate.  Eyes: Conjunctivae are normal. Pupils are equal, round, and reactive to light. Right eye exhibits no discharge. Left eye exhibits no discharge. No scleral icterus.  Neck: Normal range of motion. Neck supple. No tracheal deviation present. No thyromegaly present.  Cardiovascular: Normal rate, regular rhythm and normal heart sounds.  Exam reveals no gallop and no friction rub.   No murmur heard. Pulmonary/Chest: Effort normal and breath sounds normal. No stridor. No respiratory distress. She has no wheezes. She has no rales.  Lymphadenopathy:    She has no cervical adenopathy.  Skin: Skin is warm and dry. She is not diaphoretic.  Vitals reviewed.      Assessment & Plan:     1. Influenza Highly suspicious for influenza due to symptoms and high fever. Will treat with tamiflu as below. She is to call if symptoms worsen. - oseltamivir (TAMIFLU) 75 MG capsule; Take 1 capsule (75 mg total) by mouth 2 (two) times daily.  Dispense: 10 capsule; Refill: 0  2. Cough Will give tussionex as below for cough. Drowsiness precautions given to patient. She is not to take with her percocet. She voices understanding.  - chlorpheniramine-HYDROcodone (TUSSIONEX PENNKINETIC ER) 10-8 MG/5ML SUER; Take 5 mLs by mouth every 12 (twelve) hours as needed for cough.  Dispense: 140 mL; Refill: 0       Margaretann Loveless, PA-C  [facility]

## 2016-04-07 NOTE — Patient Instructions (Signed)

## 2016-04-12 ENCOUNTER — Telehealth: Payer: Self-pay | Admitting: *Deleted

## 2016-04-12 DIAGNOSIS — J069 Acute upper respiratory infection, unspecified: Secondary | ICD-10-CM

## 2016-04-12 MED ORDER — AMOXICILLIN-POT CLAVULANATE 875-125 MG PO TABS: 1.0000 | ORAL_TABLET | Freq: Two times a day (BID) | ORAL | 0 refills | Status: DC

## 2016-04-12 NOTE — Telephone Encounter (Signed)
[facility] secondary [facility]

## 2016-04-12 NOTE — Telephone Encounter (Signed)
Patient was notified.

## 2016-04-12 NOTE — Telephone Encounter (Signed)
Patient called office stated that she was seen in office 04/07/2016. Patient was treated for flu. Patient stated that she has finished medication but is not much better. Patient has cough, fatigue and sore throat from coughing so much. Patient her tempeture has been running around 99. Please advise?

## 2016-04-15 ENCOUNTER — Telehealth: Payer: Self-pay | Admitting: Physician Assistant

## 2016-04-15 DIAGNOSIS — Z79891 Long term (current) use of opiate analgesic: Secondary | ICD-10-CM

## 2016-04-15 DIAGNOSIS — G894 Chronic pain syndrome: Secondary | ICD-10-CM

## 2016-04-15 DIAGNOSIS — F5101 Primary insomnia: Secondary | ICD-10-CM

## 2016-04-15 MED ORDER — OXYCODONE-ACETAMINOPHEN 5-325 MG PO TABS: 1.0000 | ORAL_TABLET | ORAL | 0 refills | Status: DC | PRN

## 2016-04-15 MED ORDER — CLONAZEPAM 1 MG PO TABS: 1.0000 mg | ORAL_TABLET | Freq: Every day | ORAL | 5 refills | Status: DC

## 2016-04-15 NOTE — Telephone Encounter (Signed)
Refilled oxycodone-apap, clonazepam

## 2016-04-18 ENCOUNTER — Ambulatory Visit
Admission: RE | Admit: 2016-04-18 | Discharge: 2016-04-18 | Disposition: A | Discharge status: Home / Self Care | Payer: BLUE CROSS/BLUE SHIELD | Source: Ambulatory Visit | Attending: Family Medicine | Admitting: Family Medicine

## 2016-04-18 ENCOUNTER — Encounter: Payer: Self-pay | Admitting: Family Medicine

## 2016-04-18 ENCOUNTER — Ambulatory Visit (INDEPENDENT_AMBULATORY_CARE_PROVIDER_SITE_OTHER): Payer: BLUE CROSS/BLUE SHIELD | Admitting: Family Medicine

## 2016-04-18 VITALS — BP 100/68 | HR 105 | Temp 98.5°F | Wt 150.0 lb

## 2016-04-18 DIAGNOSIS — R05 Cough: Secondary | ICD-10-CM | POA: Insufficient documentation

## 2016-04-18 DIAGNOSIS — B349 Viral infection, unspecified: Secondary | ICD-10-CM

## 2016-04-18 DIAGNOSIS — R053 Chronic cough: Secondary | ICD-10-CM

## 2016-04-18 NOTE — Progress Notes (Signed)
Patient: Veronica Lozano Female    DOB: 22-Mar-1965   51 y.o.   MRN: [id number] Visit Date: 04/18/2016  Today's Provider: Dortha Kernennis Chrismon, PA   Chief Complaint  Patient presents with  . Cough  . Fever  . Headache  . Generalized Body Aches   Subjective:    HPI Patient is here today to follow up from OV on 04/07/2016. She was diagnosed with Influenza and prescribed Tamiflu and Tussionex. She called back on 04/12/2016 with no improvement and was prescribed Augmentin. Patient reports she is currently taking medications, but still has symptoms including cough, headache, body aches, neck pain and fever.   Past Medical History:  Diagnosis Date  . Diabetes mellitus without complication (HCC)   . Heartburn   . HLD (hyperlipidemia)   . Hypertension   . Pancreatitis    Past Surgical History:  Procedure Laterality Date  . ABDOMINAL HYSTERECTOMY    . APPENDECTOMY    . CARDIAC CATHETERIZATION    . CHOLECYSTECTOMY    . FOOT SURGERY Bilateral    for neuropathy   Family History  Problem Relation Age of Onset  . Cancer Father     Lymphoma  . Kidney cancer Father   . Cancer Mother     lung  . Diabetes Brother   . Breast cancer Sister   . Bladder Cancer Neg Hx    Allergies  Allergen Reactions  . Coconut Oil Hives  . Dapagliflozin     Other reaction(s): Other (See Comments) Yeast infection  . Empagliflozin     Other reaction(s): Other (See Comments) Yeast infection  . Glipizide Hives  . Latex Hives  . Metformin Hives  . Pantoprazole     Other reaction(s): Vomiting  . Morphine And Related Itching and Rash     Previous Medications   ACYCLOVIR (ZOVIRAX) 800 MG TABLET    Take 1 tablet (800 mg total) by mouth as needed.   AMOXICILLIN-CLAVULANATE (AUGMENTIN) 875-125 MG TABLET    Take 1 tablet by mouth 2 (two) times daily.   ATORVASTATIN (LIPITOR) 20 MG TABLET    Take 20 mg by mouth daily.   CHLORPHENIRAMINE-HYDROCODONE (TUSSIONEX PENNKINETIC ER) 10-8 MG/5ML SUER    Take 5 mLs by  mouth every 12 (twelve) hours as needed for cough.   CLONAZEPAM (KLONOPIN) 1 MG TABLET    Take 1 tablet (1 mg total) by mouth at bedtime.   GABAPENTIN (NEURONTIN) 300 MG CAPSULE    Take 300 mg by mouth at bedtime.    INSULIN ASPART (NOVOLOG) 100 UNIT/ML INJECTION    Inject 19 Units into the skin 3 (three) times daily before meals. Sliding scale   INSULIN GLARGINE (LANTUS) 100 UNIT/ML INJECTION    Inject 0.35 mLs (35 Units total) into the skin at bedtime.   OSELTAMIVIR (TAMIFLU) 75 MG CAPSULE    Take 1 capsule (75 mg total) by mouth 2 (two) times daily.   OXYCODONE-ACETAMINOPHEN (ROXICET) 5-325 MG TABLET    Take 1 tablet by mouth every 4 (four) hours as needed for moderate pain or severe pain.   PANCRELIPASE, LIP-PROT-AMYL, (CREON) 24000 UNITS CPEP    Take 24,000 Units by mouth 3 (three) times daily with meals. 2 tablets with meals and 1 tablet with snacks   PIOGLITAZONE (ACTOS) 30 MG TABLET    Take 30 mg by mouth daily.    PROMETHAZINE (PHENERGAN) 25 MG TABLET    Take 25 mg by mouth every 8 (eight) hours as needed for nausea or vomiting. Reported  on 04/02/2015   VARENICLINE (CHANTIX PAK) 0.5 MG X 11 & 1 MG X 42 TABLET    Take one 0.5 mg tablet by mouth once daily for 3 days, then increase to one 0.5 mg tablet twice daily for 4 days, then increase to one 1 mg tablet twice daily.   ZALEPLON (SONATA) 5 MG CAPSULE    Take 1 capsule (5 mg total) by mouth at bedtime as needed for sleep.    Review of Systems  Constitutional: Positive for chills and fever.  Respiratory: Positive for cough.   Cardiovascular: Negative.   Musculoskeletal: Positive for myalgias and neck pain.  Neurological: Positive for headaches.    Social History  Substance Use Topics  . Smoking status: Current Every Day Smoker    Packs/day: 0.50    Types: Cigarettes  . Smokeless tobacco: Never Used  . Alcohol use No   Objective:   BP 100/68 (BP Location: Right Arm, Patient Position: Sitting, Cuff Size: Normal)   Pulse (!) 105    Temp 98.5 F (36.9 C)   Wt 150 lb (68 kg)   SpO2 99%   BMI 30.30 kg/m   Physical Exam  Constitutional: She is oriented to person, place, and time. She appears well-developed and well-nourished. No distress.  HENT:  Head: Normocephalic and atraumatic.  Right Ear: Hearing normal.  Left Ear: Hearing normal.  Nose: Nose normal.  Eyes: Conjunctivae and lids are normal. Right eye exhibits no discharge. Left eye exhibits no discharge. No scleral icterus.  Neck: Neck supple.  Cardiovascular: Normal rate and regular rhythm.   Pulmonary/Chest: Effort normal and breath sounds normal. No respiratory distress. She exhibits tenderness.  Abdominal: Soft.  Musculoskeletal: Normal range of motion.  Neurological: She is alert and oriented to person, place, and time.  Skin: Skin is intact. No lesion and no rash noted.  Psychiatric: She has a normal mood and affect. Her speech is normal and behavior is normal. Thought content normal.      Assessment & Plan:     1. Persistent dry cough Persistent and recurrent cough without sputum production over the past 2-3 weeks. Treated for influenza on 04-07-16 with minimal change. No fever today but had temperature of 102.3 on 04-07-16. Lower ribs sore to compress. No rashes, wheezes, rales or rhonchi. With persistent cough and fever, was started on Augmentin on 04-12-16. Will get CBC and CXR to rule out pneumonia. Finish antibiotics and may use Mucinex-DM prn cough. Drink extra fluids. May need prednisone taper pending reports. - CBC with Differential/Platelet - DG Chest 2 View  2. Viral illness Fever, cough, body aches and headache. Has finished Tamiflu given on 04-07-16. Will check CBC to rule out secondary infection or allergies. Increase fluid intake and may use Tylenol or Advil prn fever aches or pains.  - CBC with Differential/Platelet

## 2016-04-19 LAB — CBC WITH DIFFERENTIAL/PLATELET
BASOS ABS: 0 10*3/uL (ref 0.0–0.2)
Basos: 0 %
EOS (ABSOLUTE): 0.1 10*3/uL (ref 0.0–0.4)
Eos: 1 %
HEMOGLOBIN: 14.7 g/dL (ref 11.1–15.9)
Hematocrit: 43.1 % (ref 34.0–46.6)
Immature Grans (Abs): 0 10*3/uL (ref 0.0–0.1)
Immature Granulocytes: 0 %
LYMPHS ABS: 3.2 10*3/uL — AB (ref 0.7–3.1)
Lymphs: 41 %
MCH: 30.9 pg (ref 26.6–33.0)
MCHC: 34.1 g/dL (ref 31.5–35.7)
MCV: 91 fL (ref 79–97)
Monocytes Absolute: 0.4 10*3/uL (ref 0.1–0.9)
Monocytes: 6 %
NEUTROS ABS: 4.1 10*3/uL (ref 1.4–7.0)
Neutrophils: 52 %
Platelets: 328 10*3/uL (ref 150–379)
RBC: 4.75 x10E6/uL (ref 3.77–5.28)
RDW: 13.3 % (ref 12.3–15.4)
WBC: 7.8 10*3/uL (ref 3.4–10.8)

## 2016-04-21 ENCOUNTER — Telehealth: Payer: Self-pay

## 2016-04-21 MED ORDER — PSEUDOEPH-BROMPHEN-DM 30-2-10 MG/5ML PO SYRP: ORAL_SOLUTION | ORAL | 0 refills | Status: DC

## 2016-04-21 NOTE — Telephone Encounter (Signed)
-----   Message from Tamsen Roersennis E Chrismon, [place] sent at 04/19/2016  9:52 AM EST ----- Essentially normal CBC with slight viral shift on differential. Chest x-ray normal without signs of heart or lung disease (no pneumonia or masses). Recommend trying Bromfed-DM 1-2 teaspoon QID 120 ml and Benzonatate 200 mg TID #21 to try to stop this cough. Drink plenty of water and keep appointment with gastroenterologist on 04-23-16.

## 2016-04-21 NOTE — Telephone Encounter (Signed)
Pt advised. States she already has LawyerTessalon Perles. Sent Bromfed in. Allene DillonEmily Drozdowski, CMA

## 2016-04-29 ENCOUNTER — Ambulatory Visit: Payer: BLUE CROSS/BLUE SHIELD | Admitting: Urology

## 2016-05-01 ENCOUNTER — Encounter: Payer: Self-pay | Admitting: Physician Assistant

## 2016-05-01 ENCOUNTER — Ambulatory Visit (INDEPENDENT_AMBULATORY_CARE_PROVIDER_SITE_OTHER): Payer: BLUE CROSS/BLUE SHIELD | Admitting: Physician Assistant

## 2016-05-01 VITALS — BP 100/62 | HR 96 | Temp 97.8°F | Resp 16 | Wt 150.6 lb

## 2016-05-01 DIAGNOSIS — M940 Chondrocostal junction syndrome [Tietze]: Secondary | ICD-10-CM | POA: Diagnosis not present

## 2016-05-01 MED ORDER — CYCLOBENZAPRINE HCL 5 MG PO TABS: 5.0000 mg | ORAL_TABLET | Freq: Three times a day (TID) | ORAL | 1 refills | Status: DC | PRN

## 2016-05-01 MED ORDER — MELOXICAM 15 MG PO TABS: 15.0000 mg | ORAL_TABLET | Freq: Every day | ORAL | 0 refills | Status: DC

## 2016-05-01 NOTE — Patient Instructions (Signed)
Costochondritis Costochondritis is swelling and irritation (inflammation) of the tissue (cartilage) that connects your ribs to your breastbone (sternum). This causes pain in the front of your chest. Usually, the pain:  Starts gradually.  Is in more than one rib. This condition usually goes away on its own over time. Follow these instructions at home:  Do not do anything that makes your pain worse.  If directed, put ice on the painful area:  Put ice in a plastic bag.  Place a towel between your skin and the bag.  Leave the ice on for 20 minutes, 2-3 times a day.  If directed, put heat on the affected area as often as told by your doctor. Use the heat source that your doctor tells you to use, such as a moist heat pack or a heating pad.  Place a towel between your skin and the heat source.  Leave the heat on for 20-30 minutes.  Take off the heat if your skin turns bright red. This is very important if you cannot feel pain, heat, or cold. You may have a greater risk of getting burned.  Take over-the-counter and prescription medicines only as told by your doctor.  Return to your normal activities as told by your doctor. Ask your doctor what activities are safe for you.  Keep all follow-up visits as told by your doctor. This is important. Contact a doctor if:  You have chills or a fever.  Your pain does not go away or it gets worse.  You have a cough that does not go away. Get help right away if:  You are short of breath. This information is not intended to replace advice given to you by your health care provider. Make sure you discuss any questions you have with your health care provider. Document Released: 07/30/2007 Document Revised: 08/31/2015 Document Reviewed: 06/06/2015 Elsevier Interactive Patient Education  2017 Elsevier Inc.  

## 2016-05-01 NOTE — Progress Notes (Signed)
Patient: Veronica Lozano Female    DOB: 1965-07-28   51 y.o.   MRN: [id number] Visit Date: 05/01/2016  Today's Provider: Margaretann Loveless, PA-C   Chief Complaint  Patient presents with  . Breast Problem   Subjective:    HPI Patient her today C/O pain and swelling under right breast x's several days. Patient reports pain is worsening, pain with arm movement. Patient reports she is taking oxycodone-acetaminophen 5-325 mg BID. Patient last mammogram was done on 02/19/2016 BI-RADS 1. She has had a cough for 3 weeks prior, she had flu then subsequent bronchitis.     Allergies  Allergen Reactions  . Coconut Oil Hives  . Dapagliflozin     Other reaction(s): Other (See Comments) Yeast infection  . Empagliflozin     Other reaction(s): Other (See Comments) Yeast infection  . Glipizide Hives  . Latex Hives  . Metformin Hives  . Pantoprazole     Other reaction(s): Vomiting  . Morphine And Related Itching and Rash     Current Outpatient Prescriptions:  .  acyclovir (ZOVIRAX) 800 MG tablet, Take 1 tablet (800 mg total) by mouth as needed., Disp: 90 tablet, Rfl: 3 .  amoxicillin-clavulanate (AUGMENTIN) 875-125 MG tablet, Take 1 tablet by mouth 2 (two) times daily., Disp: 20 tablet, Rfl: 0 .  atorvastatin (LIPITOR) 20 MG tablet, Take 20 mg by mouth daily., Disp: , Rfl: 0 .  brompheniramine-pseudoephedrine-DM 30-2-10 MG/5ML syrup, Take 1-2 teaspoons by mouth 4 times daily PRN, Disp: 120 mL, Rfl: 0 .  chlorpheniramine-HYDROcodone (TUSSIONEX PENNKINETIC ER) 10-8 MG/5ML SUER, Take 5 mLs by mouth every 12 (twelve) hours as needed for cough., Disp: 140 mL, Rfl: 0 .  clonazePAM (KLONOPIN) 1 MG tablet, Take 1 tablet (1 mg total) by mouth at bedtime., Disp: 30 tablet, Rfl: 5 .  gabapentin (NEURONTIN) 300 MG capsule, Take 300 mg by mouth at bedtime. , Disp: , Rfl: 0 .  insulin aspart (NOVOLOG) 100 UNIT/ML injection, Inject 19 Units into the skin 3 (three) times daily before meals. Sliding  scale, Disp: , Rfl:  .  insulin glargine (LANTUS) 100 UNIT/ML injection, Inject 0.35 mLs (35 Units total) into the skin at bedtime., Disp: 10 mL, Rfl: 11 .  oseltamivir (TAMIFLU) 75 MG capsule, Take 1 capsule (75 mg total) by mouth 2 (two) times daily., Disp: 10 capsule, Rfl: 0 .  oxyCODONE-acetaminophen (ROXICET) 5-325 MG tablet, Take 1 tablet by mouth every 4 (four) hours as needed for moderate pain or severe pain., Disp: 120 tablet, Rfl: 0 .  Pancrelipase, Lip-Prot-Amyl, (CREON) 24000 UNITS CPEP, Take 24,000 Units by mouth 3 (three) times daily with meals. 2 tablets with meals and 1 tablet with snacks, Disp: , Rfl:  .  pioglitazone (ACTOS) 30 MG tablet, Take 30 mg by mouth daily. , Disp: , Rfl:  .  promethazine (PHENERGAN) 25 MG tablet, Take 25 mg by mouth every 8 (eight) hours as needed for nausea or vomiting. Reported on 04/02/2015, Disp: 20 tablet, Rfl: 0 .  varenicline (CHANTIX PAK) 0.5 MG X 11 & 1 MG X 42 tablet, Take one 0.5 mg tablet by mouth once daily for 3 days, then increase to one 0.5 mg tablet twice daily for 4 days, then increase to one 1 mg tablet twice daily., Disp: 53 tablet, Rfl: 0 .  zaleplon (SONATA) 5 MG capsule, Take 1 capsule (5 mg total) by mouth at bedtime as needed for sleep., Disp: 30 capsule, Rfl: 3  Review of  Systems  Constitutional: Negative.   Respiratory: Negative for cough, chest tightness, shortness of breath and wheezing.   Cardiovascular: Negative.   Gastrointestinal: Negative.   Musculoskeletal: Positive for arthralgias.  Neurological: Negative.     Social History  Substance Use Topics  . Smoking status: Current Every Day Smoker    Packs/day: 0.50    Types: Cigarettes  . Smokeless tobacco: Never Used  . Alcohol use No   Objective:   BP 100/62 (BP Location: Left Arm, Patient Position: Sitting, Cuff Size: Large)   Pulse 84   Temp 97.8 F (36.6 C) (Oral)   Resp 16   Wt 150 lb 9.6 oz (68.3 kg)   SpO2 97%   BMI 30.42 kg/m  Vitals:   05/01/16 0813   BP: 100/62  Pulse: 84  Resp: 16  Temp: 97.8 F (36.6 C)  TempSrc: Oral  SpO2: 97%  Weight: 150 lb 9.6 oz (68.3 kg)     Physical Exam  Constitutional: She appears well-developed and well-nourished. No distress.  Neck: Normal range of motion. Neck supple. No tracheal deviation present. No thyromegaly present.  Cardiovascular: Normal rate, regular rhythm and normal heart sounds.  Exam reveals no gallop and no friction rub.   No murmur heard. Pulmonary/Chest: Effort normal and breath sounds normal. No respiratory distress. She has no wheezes. She has no rales. She exhibits tenderness (tender at right costochondral angle and right rib cage under axilla). Right breast exhibits no inverted nipple, no mass, no nipple discharge, no skin change and no tenderness. Left breast exhibits no inverted nipple, no mass, no nipple discharge, no skin change and no tenderness. Breasts are symmetrical.  Lymphadenopathy:    She has no cervical adenopathy.  Skin: She is not diaphoretic.  Vitals reviewed.      Assessment & Plan:     1. Costochondritis Will treat with meloxicam and flexeril as below. Discussed deep breathing exercises. Discussed steroid taper but will avoid if possible due to diabetes. She is to call if symptoms worsen. - meloxicam (MOBIC) 15 MG tablet; Take 1 tablet (15 mg total) by mouth daily.  Dispense: 30 tablet; Refill: 0 - cyclobenzaprine (FLEXERIL) 5 MG tablet; Take 1 tablet (5 mg total) by mouth 3 (three) times daily as needed for muscle spasms.  Dispense: 30 tablet; Refill: 1       Margaretann LovelessJennifer M Batul Diego, PA-C  [facility]

## 2016-05-11 NOTE — Progress Notes (Deleted)
05/12/2016 6:12 PM   Ginelle KAMERIA CANIZARES [birth year] [id number]  Referring provider: Margaretann Loveless, PA-C [address] [place], [place] [address]  No chief complaint on file.   HPI: 51 yo WF with a history of hematuria and nocturia who presents today for a 1 year follow-up.  History of hematuria Patient completed a hematuria workup in 04/2015.  No worrisome findings were discovered.  She has denied any gross hematuria. Her UA today ***  Nocturia Patient *** Her PVR is *** mL.  she has not undergone a sleep study as of this visit.   ***  PMH: Past Medical History:  Diagnosis Date  . Diabetes mellitus without complication (HCC)   . Heartburn   . HLD (hyperlipidemia)   . Hypertension   . Pancreatitis     Surgical History: Past Surgical History:  Procedure Laterality Date  . ABDOMINAL HYSTERECTOMY    . APPENDECTOMY    . CARDIAC CATHETERIZATION    . CHOLECYSTECTOMY    . FOOT SURGERY Bilateral    for neuropathy    Home Medications:  Allergies as of 05/12/2016      Reactions   Coconut Oil Hives   Dapagliflozin    Other reaction(s): Other (See Comments) Yeast infection   Empagliflozin    Other reaction(s): Other (See Comments) Yeast infection   Glipizide Hives   Latex Hives   Metformin Hives   Pantoprazole    Other reaction(s): Vomiting   Morphine And Related Itching, Rash      Medication List       Accurate as of 05/11/16  6:12 PM. Always use your most recent med list.          acyclovir 800 MG tablet Commonly known as:  ZOVIRAX Take 1 tablet (800 mg total) by mouth as needed.   atorvastatin 20 MG tablet Commonly known as:  LIPITOR Take 20 mg by mouth daily.   clonazePAM 1 MG tablet Commonly known as:  KLONOPIN Take 1 tablet (1 mg total) by mouth at bedtime.   CREON 24000-76000 units Cpep Generic drug:  Pancrelipase (Lip-Prot-Amyl) Take 24,000 Units by mouth 3 (three) times daily with meals. 2 tablets with meals and 1 tablet  with snacks   cyclobenzaprine 5 MG tablet Commonly known as:  FLEXERIL Take 1 tablet (5 mg total) by mouth 3 (three) times daily as needed for muscle spasms.   gabapentin 300 MG capsule Commonly known as:  NEURONTIN Take 300 mg by mouth at bedtime.   insulin aspart 100 UNIT/ML injection Commonly known as:  novoLOG Inject 19 Units into the skin 3 (three) times daily before meals. Sliding scale   insulin glargine 100 UNIT/ML injection Commonly known as:  LANTUS Inject 0.35 mLs (35 Units total) into the skin at bedtime.   meloxicam 15 MG tablet Commonly known as:  MOBIC Take 1 tablet (15 mg total) by mouth daily.   oxyCODONE-acetaminophen 5-325 MG tablet Commonly known as:  ROXICET Take 1 tablet by mouth every 4 (four) hours as needed for moderate pain or severe pain.   pioglitazone 30 MG tablet Commonly known as:  ACTOS Take 30 mg by mouth daily.   promethazine 25 MG tablet Commonly known as:  PHENERGAN Take 25 mg by mouth every 8 (eight) hours as needed for nausea or vomiting. Reported on 04/02/2015   varenicline 0.5 MG X 11 & 1 MG X 42 tablet Commonly known as:  CHANTIX PAK Take one 0.5 mg tablet by mouth once daily  for 3 days, then increase to one 0.5 mg tablet twice daily for 4 days, then increase to one 1 mg tablet twice daily.   zaleplon 5 MG capsule Commonly known as:  SONATA Take 1 capsule (5 mg total) by mouth at bedtime as needed for sleep.       Allergies:  Allergies  Allergen Reactions  . Coconut Oil Hives  . Dapagliflozin     Other reaction(s): Other (See Comments) Yeast infection  . Empagliflozin     Other reaction(s): Other (See Comments) Yeast infection  . Glipizide Hives  . Latex Hives  . Metformin Hives  . Pantoprazole     Other reaction(s): Vomiting  . Morphine And Related Itching and Rash    Family History: Family History  Problem Relation Age of Onset  . Cancer Father     Lymphoma  . Kidney cancer Father   . Cancer Mother     lung   . Diabetes Brother   . Breast cancer Sister   . Bladder Cancer Neg Hx     Social History:  reports that she has been smoking Cigarettes.  She has been smoking about 0.50 packs per day. She has never used smokeless tobacco. She reports that she does not drink alcohol or use drugs.  ROS:                                        Physical Exam: There were no vitals taken for this visit.  Constitutional: Well nourished. Alert and oriented, No acute distress. HEENT: [place] AT, moist mucus membranes. Trachea midline, no masses. Cardiovascular: No clubbing, cyanosis, or edema. Respiratory: Normal respiratory effort, no increased work of breathing. GI: Abdomen is soft, non tender, non distended, no abdominal masses. Liver and spleen not palpable.  No hernias appreciated.  Stool sample for occult testing is not indicated.   GU: No CVA tenderness.  No bladder fullness or masses.   Skin: No rashes, bruises or suspicious lesions. Lymph: No cervical or inguinal adenopathy. Neurologic: Grossly intact, no focal deficits, moving all 4 extremities. Psychiatric: Normal mood and affect.  Laboratory Data: Lab Results  Component Value Date   WBC 7.8 04/18/2016   HGB 16.8 (H) 10/25/2015   HCT 43.1 04/18/2016   MCV 91 04/18/2016   PLT 328 04/18/2016    Lab Results  Component Value Date   CREATININE 0.43 (L) 10/25/2015    Lab Results  Component Value Date   HGBA1C >14.0 04/24/2015    Lab Results  Component Value Date   AST 16 10/25/2015   Lab Results  Component Value Date   ALT 16 10/25/2015    Results for orders placed or performed in visit on 04/18/16  CBC with Differential/Platelet  Result Value Ref Range   WBC 7.8 3.4 - 10.8 x10E3/uL   RBC 4.75 3.77 - 5.28 x10E6/uL   Hemoglobin 14.7 11.1 - 15.9 g/dL   Hematocrit 16.1 09.6 - 46.6 %   MCV 91 79 - 97 fL   MCH 30.9 26.6 - 33.0 pg   MCHC 34.1 31.5 - 35.7 g/dL   RDW 04.5 40.9 - 81.1 %   Platelets 328 150 -  379 x10E3/uL   Neutrophils 52 Not Estab. %   Lymphs 41 Not Estab. %   Monocytes 6 Not Estab. %   Eos 1 Not Estab. %   Basos 0 Not Estab. %  Neutrophils Absolute 4.1 1.4 - 7.0 x10E3/uL   Lymphocytes Absolute 3.2 (H) 0.7 - 3.1 x10E3/uL   Monocytes Absolute 0.4 0.1 - 0.9 x10E3/uL   EOS (ABSOLUTE) 0.1 0.0 - 0.4 x10E3/uL   Basophils Absolute 0.0 0.0 - 0.2 x10E3/uL   Immature Granulocytes 0 Not Estab. %   Immature Grans (Abs) 0.0 0.0 - 0.1 x10E3/uL   Urinalysis ***  Pertinent Imaging: ***  Assessment & Plan:    1. History of hematuria  - hematuria work up completed in 04/2015 - no worrisome findings  2. Nocturia   - I explained to the patient that nocturia is often multi-factorial and difficult to treat.  Sleeping disorders, heart conditions, peripheral vascular disease, diabetes, an enlarged prostate for men, an urethral stricture causing bladder outlet obstruction and/or certain medications can contribute to nocturia.  - I have suggested that the patient avoid caffeine after noon and alcohol in the evening.  He or she may also benefit from fluid restrictions after 6:00 in the evening and voiding just prior to bedtime.  - I have explained that research studies have showed that over 84% of patients with sleep apnea reported frequent nighttime urination.   With sleep apnea, oxygen decreases, carbon dioxide increases, the blood become more acidic, the heart rate drops and blood vessels in the lung constrict.  The body is then alerted that something is very wrong. The sleeper must wake enough to reopen the airway. By this time, the heart is racing and experiences a false signal of fluid overload. The heart excretes a hormone-like protein that tells the body to get rid of sodium and water, resulting in nocturia.  -  I also informed the patient that a recent study noted that decreasing sodium intake to 2.3 grams daily, if they don't have issues with hyponatremia, can also reduce the number of  nightly voids  - There is also an increased incidence in sleep apnea with menopause, symptoms include night sweats, daytime sleepiness, depressed mood, and cognitive complaints like poor concentration or problems with short-term memory ***  - The patient may benefit from a discussion with his or her primary care physician to see if he or she has risk factors for sleep apnea or other sleep disturbances and obtaining a sleep study.    No Follow-up on file.  These notes generated with voice recognition software. I apologize for typographical errors.  Michiel CowboySHANNON Raliyah Montella, PA-C  [facility] [address], [address] [place], [place] [address] [phone]

## 2016-05-12 ENCOUNTER — Ambulatory Visit: Payer: BLUE CROSS/BLUE SHIELD | Admitting: Urology

## 2016-05-12 NOTE — Progress Notes (Signed)
05/13/2016 8:59 AM   Veronica Lozano 1965/10/24 [id number]  Referring provider: Margaretann Loveless, PA-C [address] [facility] [address] [facility] [address] [facility] [address] [place], [place] [address]  Chief Complaint  Patient presents with  . Hematuria    microscopic 1 year follow up    HPI: 51 yo WF with a history of hematuria and nocturia who presents today for a 1 year follow-up.  History of hematuria Patient completed a hematuria workup in 04/2015.  No worrisome findings were discovered.  She has denied any gross hematuria. Her UA today was unremarkable.    Nocturia Patient is still experiencing nocturia x 4-7.  Her PVR is 0 mL.  She has not undergone a sleep study as of this visit.     Frequency She is experiencing urgency x 4-7, frequency x 4-7, she does not limit fluids (drinks a lot of un sweet tea).  She engages in toilet mapping.  She has incontinence x 0-3.  PVR 0 mL.     PMH: Past Medical History:  Diagnosis Date  . Diabetes mellitus without complication (HCC)   . Heartburn   . HLD (hyperlipidemia)   . Hypertension   . Pancreatitis     Surgical History: Past Surgical History:  Procedure Laterality Date  . ABDOMINAL HYSTERECTOMY    . APPENDECTOMY    . CARDIAC CATHETERIZATION    . CHOLECYSTECTOMY    . FOOT SURGERY Bilateral    for neuropathy    Home Medications:  Allergies as of 05/13/2016      Reactions   Coconut Oil Hives   Dapagliflozin    Other reaction(s): Other (See Comments) Yeast infection   Empagliflozin    Other reaction(s): Other (See Comments) Yeast infection   Glipizide Hives   Latex Hives   Metformin Hives   Pantoprazole    Other reaction(s): Vomiting   Morphine And Related Itching, Rash      Medication List       Accurate as of 05/13/16  8:59 AM. Always use your most recent med list.          acyclovir 800 MG tablet Commonly known as:  ZOVIRAX Take 1 tablet (800 mg total) by mouth as needed.   atorvastatin 20 MG tablet Commonly known as:   LIPITOR Take 20 mg by mouth daily.   clonazePAM 1 MG tablet Commonly known as:  KLONOPIN Take 1 tablet (1 mg total) by mouth at bedtime.   CREON 24000-76000 units Cpep Generic drug:  Pancrelipase (Lip-Prot-Amyl) Take 24,000 Units by mouth 3 (three) times daily with meals. 2 tablets with meals and 1 tablet with snacks   cyclobenzaprine 5 MG tablet Commonly known as:  FLEXERIL Take 1 tablet (5 mg total) by mouth 3 (three) times daily as needed for muscle spasms.   gabapentin 300 MG capsule Commonly known as:  NEURONTIN Take 300 mg by mouth at bedtime.   insulin aspart 100 UNIT/ML injection Commonly known as:  novoLOG Inject 19 Units into the skin 3 (three) times daily before meals. Sliding scale   insulin glargine 100 UNIT/ML injection Commonly known as:  LANTUS Inject 0.35 mLs (35 Units total) into the skin at bedtime.   meloxicam 15 MG tablet Commonly known as:  MOBIC Take 1 tablet (15 mg total) by mouth daily.   mirabegron ER 25 MG Tb24 tablet Commonly known as:  MYRBETRIQ Take 1 tablet (25 mg total) by mouth daily.   oxyCODONE-acetaminophen 5-325 MG tablet Commonly known as:  ROXICET Take 1 tablet by mouth  every 4 (four) hours as needed for moderate pain or severe pain.   pioglitazone 30 MG tablet Commonly known as:  ACTOS Take 30 mg by mouth daily.   promethazine 25 MG tablet Commonly known as:  PHENERGAN Take 25 mg by mouth every 8 (eight) hours as needed for nausea or vomiting. Reported on 04/02/2015   varenicline 0.5 MG X 11 & 1 MG X 42 tablet Commonly known as:  CHANTIX PAK Take one 0.5 mg tablet by mouth once daily for 3 days, then increase to one 0.5 mg tablet twice daily for 4 days, then increase to one 1 mg tablet twice daily.   zaleplon 5 MG capsule Commonly known as:  SONATA Take 1 capsule (5 mg total) by mouth at bedtime as needed for sleep.       Allergies:  Allergies  Allergen Reactions  . Coconut Oil Hives  . Dapagliflozin     Other  reaction(s): Other (See Comments) Yeast infection  . Empagliflozin     Other reaction(s): Other (See Comments) Yeast infection  . Glipizide Hives  . Latex Hives  . Metformin Hives  . Pantoprazole     Other reaction(s): Vomiting  . Morphine And Related Itching and Rash    Family History: Family History  Problem Relation Age of Onset  . Cancer Father     Lymphoma  . Kidney cancer Father   . Cancer Mother     lung  . Diabetes Brother   . Cancer Brother   . Breast cancer Sister   . Bladder Cancer Neg Hx   . Prostate cancer Neg Hx     Social History:  reports that she has been smoking Cigarettes.  She has been smoking about 0.50 packs per day. She has never used smokeless tobacco. She reports that she does not drink alcohol or use drugs.  ROS: UROLOGY Frequent Urination?: No Hard to postpone urination?: No Burning/pain with urination?: No Get up at night to urinate?: No Leakage of urine?: Yes Urine stream starts and stops?: No Trouble starting stream?: No Do you have to strain to urinate?: No Blood in urine?: Yes Urinary tract infection?: No Sexually transmitted disease?: No Injury to kidneys or bladder?: No Painful intercourse?: No Weak stream?: No Currently pregnant?: No Vaginal bleeding?: No Last menstrual period?: n  Gastrointestinal Nausea?: No Vomiting?: No Indigestion/heartburn?: No Diarrhea?: No Constipation?: No  Constitutional Fever: No Night sweats?: No Weight loss?: No Fatigue?: No  Skin Skin rash/lesions?: No Itching?: No  Eyes Blurred vision?: No Double vision?: No  Ears/Nose/Throat Sore throat?: No Sinus problems?: No  Hematologic/Lymphatic Swollen glands?: No Easy bruising?: Yes  Cardiovascular Leg swelling?: No Chest pain?: No  Respiratory Cough?: No Shortness of breath?: No  Endocrine Excessive thirst?: Yes  Musculoskeletal Back pain?: Yes Joint pain?: No  Neurological Headaches?: Yes Dizziness?:  No  Psychologic Depression?: No Anxiety?: No  Physical Exam: BP 125/79   Pulse 99   Ht 4\' 11"  (1.499 m)   Wt 152 lb (68.9 kg)   BMI 30.70 kg/m   Constitutional: Well nourished. Alert and oriented, No acute distress. HEENT: [place] AT, moist mucus membranes. Trachea midline, no masses. Cardiovascular: No clubbing, cyanosis, or edema. Respiratory: Normal respiratory effort, no increased work of breathing. GI: Abdomen is soft, non tender, non distended, no abdominal masses. Liver and spleen not palpable.  No hernias appreciated.  Stool sample for occult testing is not indicated.   GU: No CVA tenderness.  No bladder fullness or masses.  Skin: No rashes, bruises or suspicious lesions. Lymph: No cervical or inguinal adenopathy. Neurologic: Grossly intact, no focal deficits, moving all 4 extremities. Psychiatric: Normal mood and affect.  Laboratory Data: Lab Results  Component Value Date   WBC 7.8 04/18/2016   HGB 16.8 (H) 10/25/2015   HCT 43.1 04/18/2016   MCV 91 04/18/2016   PLT 328 04/18/2016    Lab Results  Component Value Date   CREATININE 0.43 (L) 10/25/2015    Lab Results  Component Value Date   HGBA1C >14.0 04/24/2015    Lab Results  Component Value Date   AST 16 10/25/2015   Lab Results  Component Value Date   ALT 16 10/25/2015    Urinalysis Unremarkable.  See EPIC.    Pertinent Imaging: Results for ARIELLE, EBER (MRN [id number]) as of 05/13/2016 08:45  Ref. Range 05/13/2016 08:36  Scan Result Unknown 0    Assessment & Plan:    1. History of hematuria  - hematuria work up completed in 04/2015 - no worrisome findings  - UA today is unremarkable  - she denies any gross hematuria  2. Frequency  - offered behavioral therapies, bladder training, bladder control strategies and pelvic floor muscle training  - fluid management - drinking adequate fluids  - offered medical therapy with anticholinergic therapy or beta-3 adrenergic receptor agonist and  the potential side effects of each therapy   - would like to try the beta-3 adrenergic receptor agonist (Myrbetriq).  Given Myrbetriq 25 mg samples, #28.  I have reviewed with the patient of the side effects of Myrbetriq, such as: elevation in BP, urinary retention and/or HA.    - RTC in 3 weeks for PVR and symptom recheck  3. Nocturia   - I explained to the patient that nocturia is often multi-factorial and difficult to treat.  Sleeping disorders, heart conditions, peripheral vascular disease, diabetes, an enlarged prostate for men, an urethral stricture causing bladder outlet obstruction and/or certain medications can contribute to nocturia.  - I have suggested that the patient avoid caffeine after noon and alcohol in the evening.  He or she may also benefit from fluid restrictions after 6:00 in the evening and voiding just prior to bedtime.  - I have explained that research studies have showed that over 84% of patients with sleep apnea reported frequent nighttime urination.   With sleep apnea, oxygen decreases, carbon dioxide increases, the blood become more acidic, the heart rate drops and blood vessels in the lung constrict.  The body is then alerted that something is very wrong. The sleeper must wake enough to reopen the airway. By this time, the heart is racing and experiences a false signal of fluid overload. The heart excretes a hormone-like protein that tells the body to get rid of sodium and water, resulting in nocturia.  -  I also informed the patient that a recent study noted that decreasing sodium intake to 2.3 grams daily, if they don't have issues with hyponatremia, can also reduce the number of nightly voids  - There is also an increased incidence in sleep apnea with menopause, symptoms include night sweats, daytime sleepiness, depressed mood, and cognitive complaints like poor concentration or problems with short-term memory   - The patient may benefit from a discussion with his or her  primary care physician to see if he or she has risk factors for sleep apnea or other sleep disturbances and obtaining a sleep study.    Return in about 3 weeks (around 06/03/2016)  for PVR and OAB questionnaire.  These notes generated with voice recognition software. I apologize for typographical errors.  Michiel Cowboy, PA-C  [facility] [address], [address] [place], [place] [address] [phone]

## 2016-05-13 ENCOUNTER — Ambulatory Visit: Payer: BLUE CROSS/BLUE SHIELD | Admitting: Urology

## 2016-05-13 ENCOUNTER — Encounter: Payer: Self-pay | Admitting: Urology

## 2016-05-13 ENCOUNTER — Telehealth: Payer: Self-pay | Admitting: Physician Assistant

## 2016-05-13 VITALS — BP 125/79 | HR 99 | Ht 59.0 in | Wt 152.0 lb

## 2016-05-13 DIAGNOSIS — Z87448 Personal history of other diseases of urinary system: Secondary | ICD-10-CM | POA: Diagnosis not present

## 2016-05-13 DIAGNOSIS — R350 Frequency of micturition: Secondary | ICD-10-CM | POA: Diagnosis not present

## 2016-05-13 DIAGNOSIS — G894 Chronic pain syndrome: Secondary | ICD-10-CM

## 2016-05-13 DIAGNOSIS — R351 Nocturia: Secondary | ICD-10-CM | POA: Diagnosis not present

## 2016-05-13 DIAGNOSIS — Z79891 Long term (current) use of opiate analgesic: Secondary | ICD-10-CM

## 2016-05-13 LAB — MICROSCOPIC EXAMINATION: RBC, UA: NONE SEEN /hpf (ref 0–?)

## 2016-05-13 LAB — URINALYSIS, COMPLETE
BILIRUBIN UA: NEGATIVE
GLUCOSE, UA: NEGATIVE
Ketones, UA: NEGATIVE
Leukocytes, UA: NEGATIVE
NITRITE UA: NEGATIVE
Protein, UA: NEGATIVE
RBC, UA: NEGATIVE
SPEC GRAV UA: 1.02 (ref 1.005–1.030)
UUROB: 0.2 mg/dL (ref 0.2–1.0)
pH, UA: 6 (ref 5.0–7.5)

## 2016-05-13 LAB — BLADDER SCAN AMB NON-IMAGING: SCAN RESULT: 0

## 2016-05-13 MED ORDER — OXYCODONE-ACETAMINOPHEN 5-325 MG PO TABS: 1.0000 | ORAL_TABLET | ORAL | 0 refills | Status: DC | PRN

## 2016-05-13 MED ORDER — MIRABEGRON ER 25 MG PO TB24: 25.0000 mg | ORAL_TABLET | Freq: Every day | ORAL | 0 refills | Status: DC

## 2016-05-13 NOTE — Telephone Encounter (Signed)
Patient is requesting refill on oxyCODONE-acetaminophen (ROXICET) 5-325 MG tablet  ° °

## 2016-05-13 NOTE — Telephone Encounter (Signed)
Rx printed

## 2016-05-20 ENCOUNTER — Telehealth: Payer: Self-pay

## 2016-05-20 NOTE — Telephone Encounter (Signed)
PA for myrbetriq was DENIED! 

## 2016-05-28 IMAGING — MR MR ABDOMEN WO/W CM MRCP
11 of 20 series · 28 of 48 positions shown · IV contrast (14 ML MULTIHANCE)
Comparison: 10/17/2014

CLINICAL DATA: Left upper abdominal pain, nausea and vomiting.
Lipase mildly elevated.

EXAM:
MRI ABDOMEN WITHOUT AND WITH CONTRAST (INCLUDING MRCP)
TECHNIQUE: Multiplanar multisequence MR imaging of the abdomen was performed
both before and after the administration of intravenous contrast.
Heavily T2-weighted images of the biliary and pancreatic ducts were
obtained, and three-dimensional MRCP images were rendered by post
processing.
CONTRAST:  14 cc MultiHance

[Series 2: T2 fat-sat · axial · 8.0mm · 0.74mm/px · z∈[-120,+110]mm · 2 of 25 slices shown]
[im 1/25]
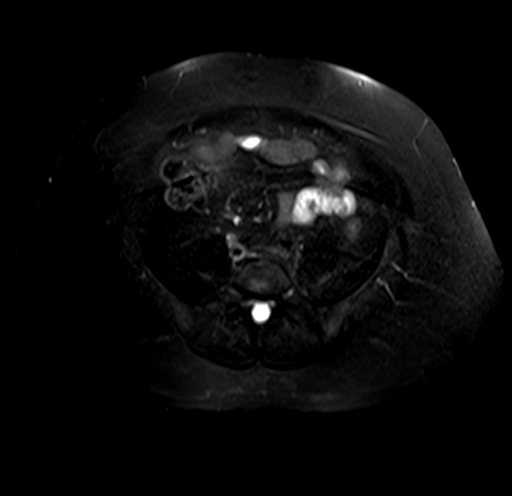
[im 25/25]
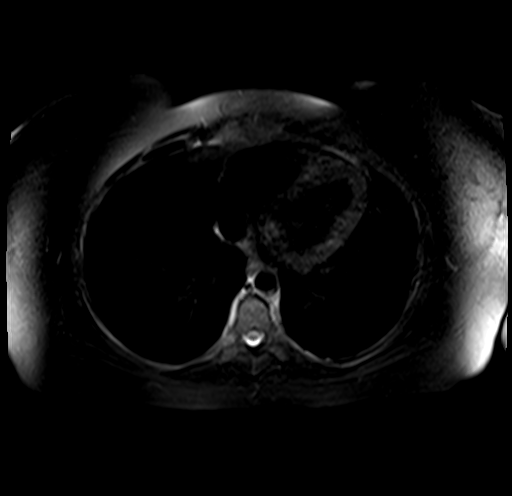

[Series 4: DWI · axial · 6.0mm · 1.98mm/px · z∈[-127,+118]mm · 5 of 105 slices shown]
[im 1/105]
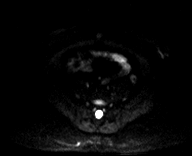
[im 27/105]
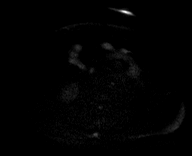
[im 53/105]
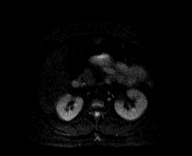
[im 79/105]
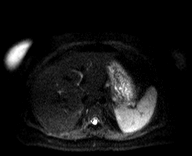
[im 105/105]
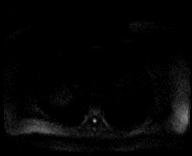

[Series 5: ax dwi_adc · axial · 6.0mm · 1.98mm/px · z∈[-127,+118]mm · 2 of 35 slices shown]
[im 1/35]
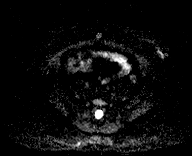
[im 35/35]
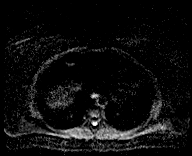

[Series 9: cor thins · coronal · 4.0mm · 0.89mm/px · 1 of 16 slices shown]
[im 1/16]
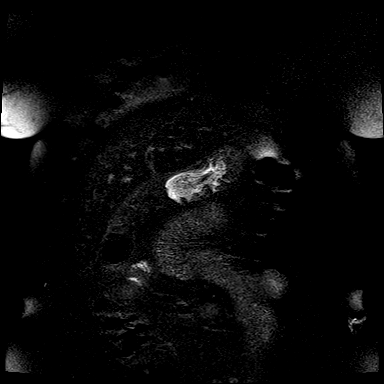

[Series 12: cor tru fisp · coronal · 4.0mm · 0.74mm/px · 2 of 50 slices shown]
[im 1/50]
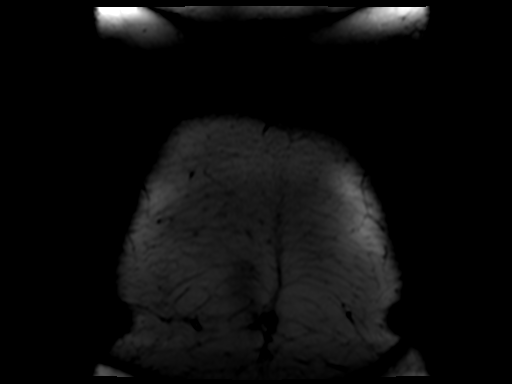
[im 50/50]
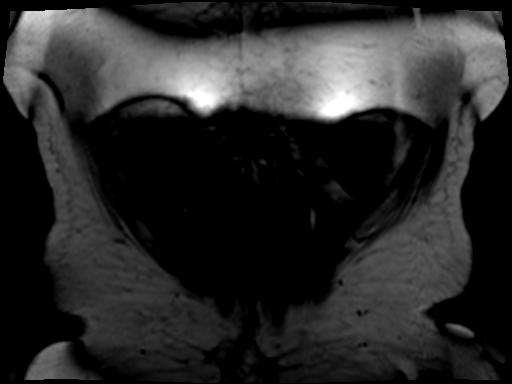

[Series 17: T2 · axial · 7.3mm · 0.74mm/px · 1 of 25 slices shown]
[im 1/25]
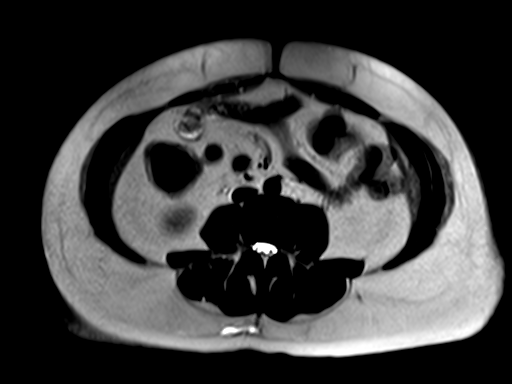

[Series 18: T1 dynamic fat-sat · axial · non-contrast · 2.5mm · 0.74mm/px · z∈[-81,+137]mm · 3 of 88 slices shown (1 of 2)]
[im 1/88]
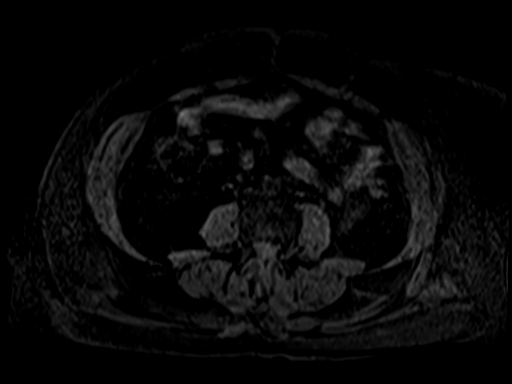
[im 44/88]
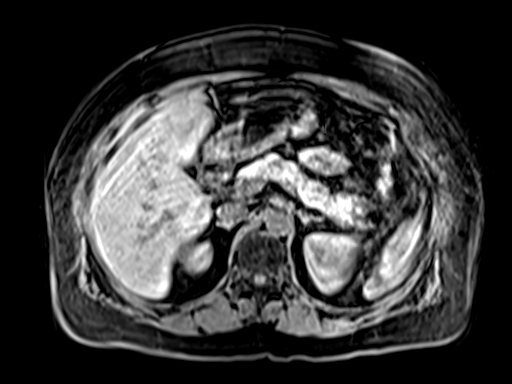
[im 88/88]
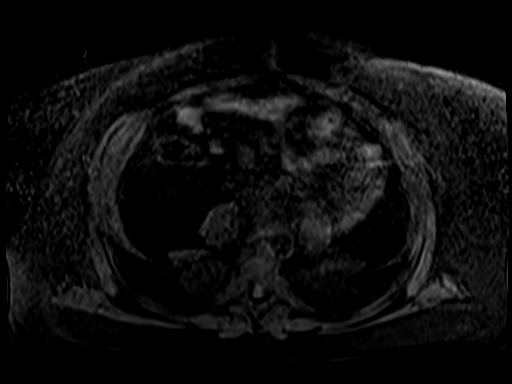

[Series 19: T1 dynamic fat-sat · axial · 2.5mm · 0.74mm/px · z∈[-81,+137]mm · 3 of 88 slices shown (2 of 2)]
[im 1/88]
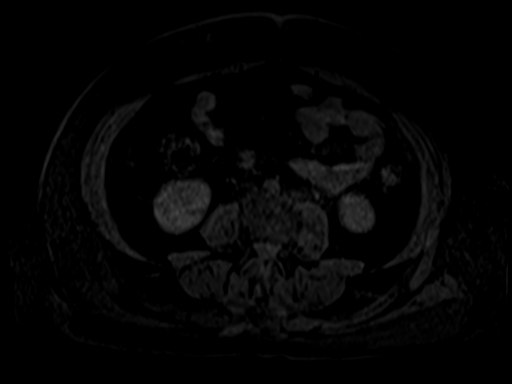
[im 44/88]
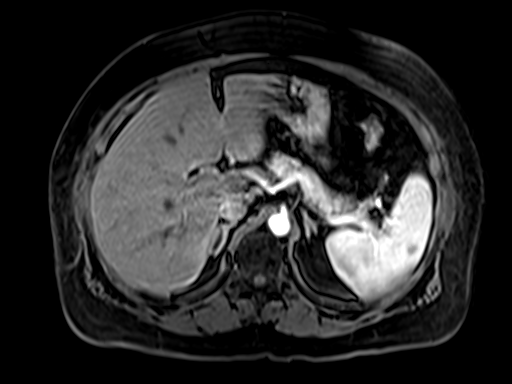
[im 88/88]
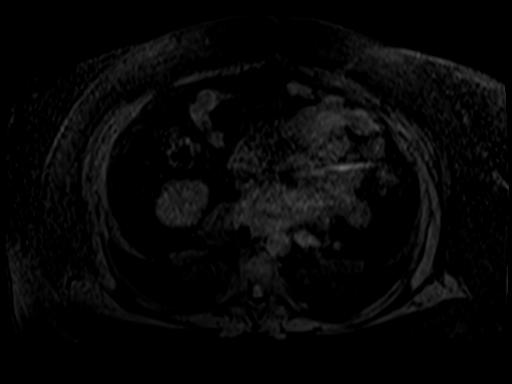

[Series 20: T1 dynamic fat-sat post-contrast · axial · 2.5mm · 0.74mm/px · z∈[-81,+137]mm · 3 of 88 slices shown (1 of 3)]
[im 1/88]
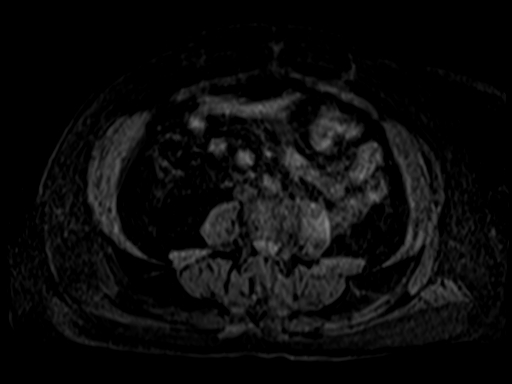
[im 44/88]
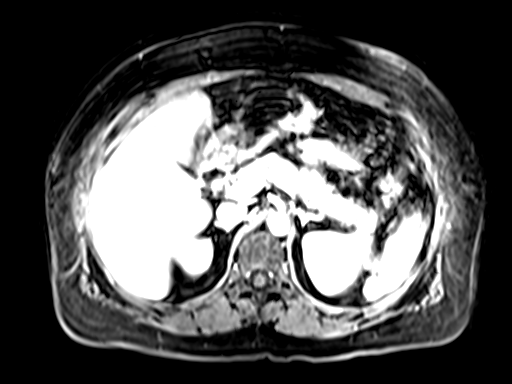
[im 88/88]
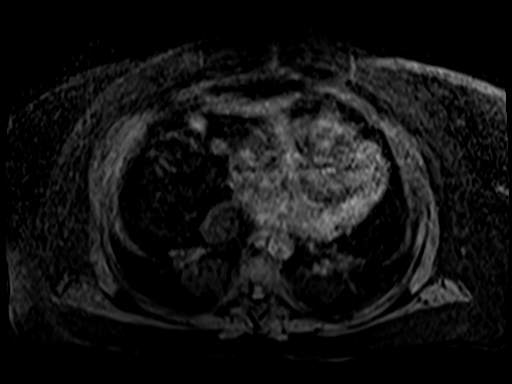

[Series 21: T1 dynamic fat-sat post-contrast · axial · 2.5mm · 0.74mm/px · z∈[-81,+137]mm · 3 of 88 slices shown (2 of 3)]
[im 1/88]
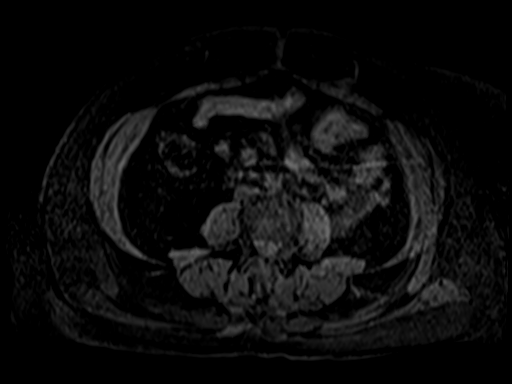
[im 44/88]
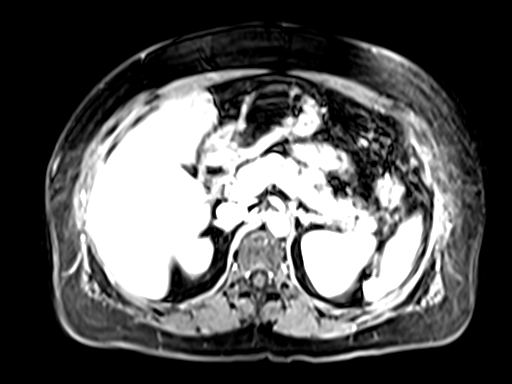
[im 88/88]
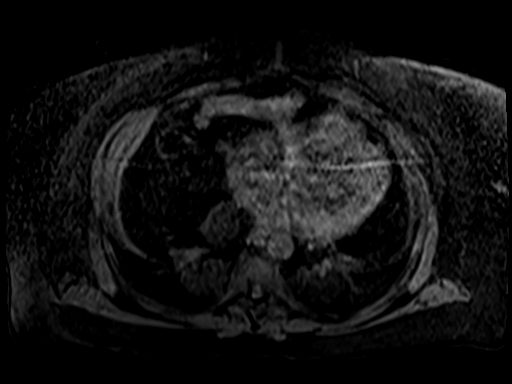

[Series 22: T1 dynamic fat-sat post-contrast · coronal · 2.5mm · 0.82mm/px · 3 of 80 slices shown (3 of 3)]
[im 1/80]
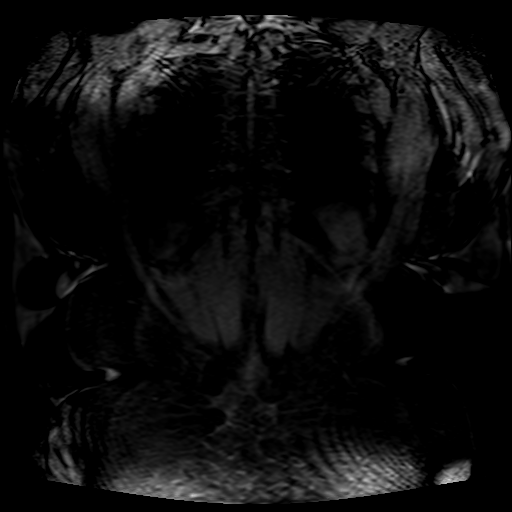
[im 40/80]
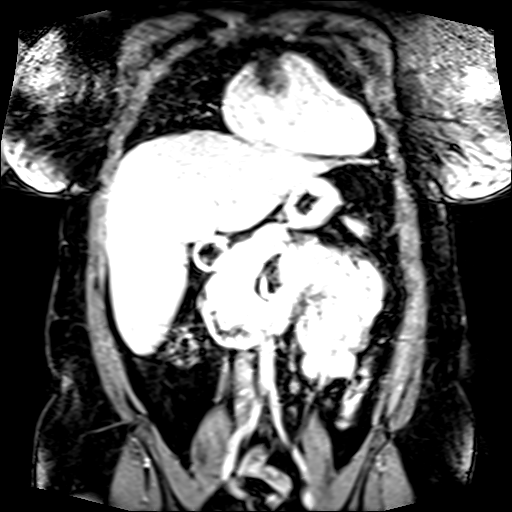
[im 80/80]
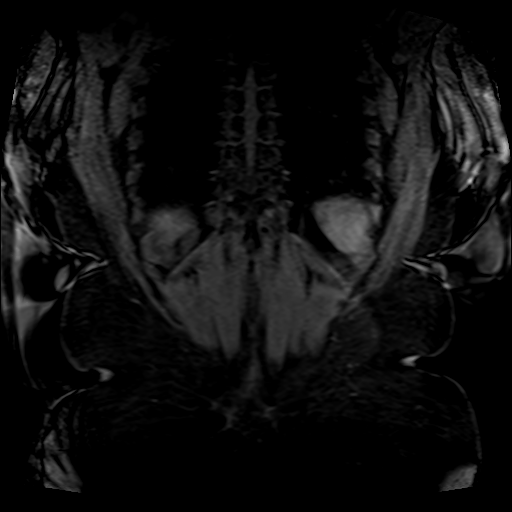

[28 of 48 positions shown; findings below may reference images not displayed]

FINDINGS: Despite efforts by the technologist and patient, motion artifact is
present on today's exam and could not be eliminated. This reduces
exam sensitivity and specificity.

Lower chest:  Unremarkable

Hepatobiliary: No biliary dilatation, stricturing, or other
significant biliary abnormality. No focal hepatic lesion.

Pancreas: Indistinctly increased T2 signal and diffusion weighted
signal in the tail the pancreas observed. On images 45-52 of series
19 mid demonstrate arterial phase hypo enhancement in this portion
of the tail the pancreas. On later imaging the pancreatic tail
enhancement is isointense to the rest of the pancreas. Subtraction
images are not highly useful due to the degree of motion and
misregistration. There is low-level edema adjacent to the pancreatic
tail, as on images 12 through 15 of series 17.

Spleen: Unremarkable

Adrenals/Urinary Tract: Unremarkable

Stomach/Bowel: Unremarkable

Vascular/Lymphatic: Unremarkable

Other: No supplemental non-categorized findings.

Musculoskeletal: Unremarkable
IMPRESSION: 1. There is edema adjacent to the pancreatic tail along with mildly
increased T2 signal, increased diffusion-weighted signal, and
arterial phase hypo enhancement in the pancreatic tail. These are
findings which could overlap between focal pancreatitis and
pancreatic malignancy. Reviewing the patient's recent
cross-sectional imaging, there were inflammatory findings around the
pancreatic head on 06/24/2014 (but the pancreatic tail appeared
normal), and the entire pancreas appeared normal on 05/29/2014 (but
the patient had ascending colitis). On the CT from 10/17/2014, the
appearance is as it is today, with mild focal hypoenhancement in the
pancreatic tail and some adjacent inflammatory stranding. No
pancreas divisum. It can be problematic to differentiate focal
pancreatitis from pancreatic mass, but the fact that the pancreatic
tail appeared normal 4 months ago seems to argue against a
pancreatic tail malignancy. The patient's symptoms and the
surrounding edema also suggest pancreatic inflammation. Short of a
biopsy, there is not an imaging mechanism to provide certainty in
this differential diagnosis at this time. I would recommend treating
as focal pancreatitis, with a very low threshold for reimaging by CT
or MRI to confirm complete resolution.

## 2016-06-02 NOTE — Progress Notes (Addendum)
06/03/2016 9:13 AM   Veronica Lozano 12/29/1965 [id number]  Referring provider: Margaretann Loveless, PA-C [address] [place], [place] [address]  Chief Complaint  Patient presents with  . Nocturia    3 week follow up      HPI: 51 yo WF with a history of hematuria, frequency and nocturia who presents today for a 3 week follow up after a trial of Mrybetriq 25 mg daily.    History of hematuria Patient completed a hematuria workup in 04/2015.  No worrisome findings were discovered.  She has denied any gross hematuria. Her UA on 05/13/3016 was unremarkable.    Nocturia Patient is experiencing nocturia x 0-3 (improved).  She has restricted her fluids prior to bedtime and this helped with nocturia.  Her PVR was 0 mL.    Frequency She is experiencing urgency x 0-3 (improved), frequency x 0-3 (improved), she does not limit fluids (she cut down on her tea consumption).  She engages in toilet mapping.  She has incontinence x 0-3 (stable).  PVR 0 mL.  She did not take the Myrbetriq as her insurance would not cover the medication and her husband was concerned about the side effects.     PMH: Past Medical History:  Diagnosis Date  . Diabetes mellitus without complication (HCC)   . Heartburn   . HLD (hyperlipidemia)   . Hypertension   . Pancreatitis     Surgical History: Past Surgical History:  Procedure Laterality Date  . ABDOMINAL HYSTERECTOMY    . APPENDECTOMY    . CARDIAC CATHETERIZATION    . CARPAL TUNNEL RELEASE Bilateral 1998  . CHOLECYSTECTOMY    . FOOT SURGERY Bilateral    for neuropathy    Home Medications:  Allergies as of 06/03/2016      Reactions   Coconut Oil Hives   Dapagliflozin    Other reaction(s): Other (See Comments) Yeast infection   Empagliflozin    Other reaction(s): Other (See Comments) Yeast infection   Glipizide Hives   Latex Hives   Metformin Hives   Pantoprazole    Other reaction(s): Vomiting   Morphine And Related  Itching, Rash      Medication List       Accurate as of 06/03/16  9:13 AM. Always use your most recent med list.          acyclovir 800 MG tablet Commonly known as:  ZOVIRAX Take 1 tablet (800 mg total) by mouth as needed.   atorvastatin 20 MG tablet Commonly known as:  LIPITOR Take 20 mg by mouth daily.   clonazePAM 1 MG tablet Commonly known as:  KLONOPIN Take 1 tablet (1 mg total) by mouth at bedtime.   CREON 24000-76000 units Cpep Generic drug:  Pancrelipase (Lip-Prot-Amyl) Take 24,000 Units by mouth 3 (three) times daily with meals. 2 tablets with meals and 1 tablet with snacks   cyclobenzaprine 5 MG tablet Commonly known as:  FLEXERIL Take 1 tablet (5 mg total) by mouth 3 (three) times daily as needed for muscle spasms.   gabapentin 300 MG capsule Commonly known as:  NEURONTIN Take 300 mg by mouth at bedtime.   insulin aspart 100 UNIT/ML injection Commonly known as:  novoLOG Inject 19 Units into the skin 3 (three) times daily before meals. Sliding scale   insulin glargine 100 UNIT/ML injection Commonly known as:  LANTUS Inject 0.35 mLs (35 Units total) into the skin at bedtime.   meloxicam 15 MG tablet Commonly known as:  MOBIC Take 1 tablet (15 mg total) by mouth daily.   mirabegron ER 25 MG Tb24 tablet Commonly known as:  MYRBETRIQ Take 1 tablet (25 mg total) by mouth daily.   oxyCODONE-acetaminophen 5-325 MG tablet Commonly known as:  ROXICET Take 1 tablet by mouth every 4 (four) hours as needed for moderate pain or severe pain.   pioglitazone 30 MG tablet Commonly known as:  ACTOS Take 30 mg by mouth daily.   promethazine 25 MG tablet Commonly known as:  PHENERGAN Take 25 mg by mouth every 8 (eight) hours as needed for nausea or vomiting. Reported on 04/02/2015   varenicline 0.5 MG X 11 & 1 MG X 42 tablet Commonly known as:  CHANTIX PAK Take one 0.5 mg tablet by mouth once daily for 3 days, then increase to one 0.5 mg tablet twice daily for 4  days, then increase to one 1 mg tablet twice daily.   zaleplon 5 MG capsule Commonly known as:  SONATA Take 1 capsule (5 mg total) by mouth at bedtime as needed for sleep.       Allergies:  Allergies  Allergen Reactions  . Coconut Oil Hives  . Dapagliflozin     Other reaction(s): Other (See Comments) Yeast infection  . Empagliflozin     Other reaction(s): Other (See Comments) Yeast infection  . Glipizide Hives  . Latex Hives  . Metformin Hives  . Pantoprazole     Other reaction(s): Vomiting  . Morphine And Related Itching and Rash    Family History: Family History  Problem Relation Age of Onset  . Cancer Father     Lymphoma  . Kidney cancer Father   . Cancer Mother     lung  . Diabetes Brother   . Cancer Brother   . Breast cancer Sister   . Bladder Cancer Neg Hx   . Prostate cancer Neg Hx     Social History:  reports that she has been smoking Cigarettes.  She has been smoking about 0.50 packs per day. She has never used smokeless tobacco. She reports that she does not drink alcohol or use drugs.  ROS: UROLOGY Frequent Urination?: No Hard to postpone urination?: No Burning/pain with urination?: No Get up at night to urinate?: No Leakage of urine?: No Urine stream starts and stops?: No Trouble starting stream?: No Do you have to strain to urinate?: No Blood in urine?: No Urinary tract infection?: No Sexually transmitted disease?: No Injury to kidneys or bladder?: No Painful intercourse?: No Weak stream?: No Currently pregnant?: No Vaginal bleeding?: No Last menstrual period?: n/a  Gastrointestinal Nausea?: No Vomiting?: No Indigestion/heartburn?: No Diarrhea?: Yes Constipation?: No  Constitutional Fever: No Night sweats?: Yes Weight loss?: No Fatigue?: Yes  Skin Skin rash/lesions?: No Itching?: No  Eyes Blurred vision?: No Double vision?: No  Ears/Nose/Throat Sore throat?: No Sinus problems?: No  Hematologic/Lymphatic Swollen  glands?: Yes Easy bruising?: Yes  Cardiovascular Leg swelling?: No Chest pain?: No  Respiratory Cough?: No Shortness of breath?: No  Endocrine Excessive thirst?: Yes  Musculoskeletal Back pain?: No Joint pain?: No  Neurological Headaches?: No Dizziness?: No  Psychologic Depression?: No Anxiety?: No  Physical Exam: BP (!) 136/93   Pulse (!) 109   Ht  (1.499 m)   Wt 149 lb 9.6 oz (67.9 kg)   BMI 30.22 kg/m   Constitutional: Well nourished. Alert and oriented, No acute distress. HEENT: [place] AT, moist mucus membranes. Trachea midline, no masses. Cardiovascular: No clubbing, cyanosis, or edema.  Respiratory: Normal respiratory effort, no increased work of breathing. Skin: No rashes, bruises or suspicious lesions. Lymph: No cervical or inguinal adenopathy. Neurologic: Grossly intact, no focal deficits, moving all 4 extremities. Psychiatric: Normal mood and affect.  Laboratory Data: Lab Results  Component Value Date   WBC 7.8 04/18/2016   HGB 16.8 (H) 10/25/2015   HCT 43.1 04/18/2016   MCV 91 04/18/2016   PLT 328 04/18/2016    Lab Results  Component Value Date   CREATININE 0.43 (L) 10/25/2015    Lab Results  Component Value Date   HGBA1C >14.0 04/24/2015    Lab Results  Component Value Date   AST 16 10/25/2015   Lab Results  Component Value Date   ALT 16 10/25/2015     Pertinent Imaging: Results for AYELEN, SCIORTINO (MRN [id number]) as of 06/03/2016 08:59  Ref. Range 06/03/2016 08:41  Scan Result Unknown 0    Assessment & Plan:    1. History of hematuria (Not addressed at this visit)  - hematuria work up completed in 04/2015 - no worrisome findings  - UA On 05/13/2016 was negative for microscopic hematuria  - she denies any gross hematuria  - Continue yearly UA's    2. Frequency  - offered behavioral therapies, bladder training, bladder control strategies and pelvic floor muscle training -patient deferred  - fluid management -  drinking adequate fluids  - offered medical therapy with anticholinergic therapy or beta-3 adrenergic receptor agonist and the potential side effects of each therapy - did not take the Mybetriq but feels that her urinary symptoms are better with behavioral changes  - RTC in 12 months for PVR and an OAB questionnaire  3. Nocturia   - Patient has seen improvement with decreasing fluids before bedtime   Return in about 1 year (around 06/03/2017) for UA, PVR and OAB questionnaire.  These notes generated with voice recognition software. I apologize for typographical errors.  Michiel Cowboy, PA-C  [facility] [address] [place], [place] [address] [phone]

## 2016-06-03 ENCOUNTER — Encounter: Payer: Self-pay | Admitting: Urology

## 2016-06-03 ENCOUNTER — Ambulatory Visit: Payer: BLUE CROSS/BLUE SHIELD | Admitting: Urology

## 2016-06-03 VITALS — BP 136/93 | HR 109 | Ht 59.0 in | Wt 149.6 lb

## 2016-06-03 DIAGNOSIS — R350 Frequency of micturition: Secondary | ICD-10-CM | POA: Diagnosis not present

## 2016-06-03 DIAGNOSIS — R351 Nocturia: Secondary | ICD-10-CM

## 2016-06-03 LAB — BLADDER SCAN AMB NON-IMAGING: Scan Result: 0

## 2016-06-12 ENCOUNTER — Telehealth: Payer: Self-pay | Admitting: Physician Assistant

## 2016-06-12 DIAGNOSIS — G894 Chronic pain syndrome: Secondary | ICD-10-CM

## 2016-06-12 DIAGNOSIS — Z79891 Long term (current) use of opiate analgesic: Secondary | ICD-10-CM

## 2016-06-12 MED ORDER — OXYCODONE-ACETAMINOPHEN 5-325 MG PO TABS: 1.0000 | ORAL_TABLET | ORAL | 0 refills | Status: DC | PRN

## 2016-06-12 NOTE — Telephone Encounter (Signed)
Please review. Thanks!  

## 2016-06-12 NOTE — Telephone Encounter (Signed)
Oxycodone-apap refilled. [place] Substance registry checked and no flags noted in filling behaviors.

## 2016-06-12 NOTE — Telephone Encounter (Signed)
Pt contacted office for refill request on the following medications:  oxyCODONE-acetaminophen (ROXICET) 5-325 MG tablet.  ZO#[phone]/[id number]

## 2016-06-13 ENCOUNTER — Encounter: Payer: Self-pay | Admitting: Physician Assistant

## 2016-06-13 ENCOUNTER — Ambulatory Visit (INDEPENDENT_AMBULATORY_CARE_PROVIDER_SITE_OTHER): Payer: BLUE CROSS/BLUE SHIELD | Admitting: Physician Assistant

## 2016-06-13 DIAGNOSIS — M940 Chondrocostal junction syndrome [Tietze]: Secondary | ICD-10-CM

## 2016-06-13 MED ORDER — MELOXICAM 15 MG PO TABS: 15.0000 mg | ORAL_TABLET | Freq: Every day | ORAL | 0 refills | Status: DC

## 2016-06-13 NOTE — Patient Instructions (Signed)
Costochondritis Costochondritis is swelling and irritation (inflammation) of the tissue (cartilage) that connects your ribs to your breastbone (sternum). This causes pain in the front of your chest. The pain usually starts gradually and involves more than one rib. What are the causes? The exact cause of this condition is not always known. It results from stress on the cartilage where your ribs attach to your sternum. The cause of this stress could be:  Chest injury (trauma).  Exercise or activity, such as lifting.  Severe coughing. What increases the risk? You may be at higher risk for this condition if you:  Are female.  Are 30?50 years old.  Recently started a new exercise or work activity.  Have low levels of vitamin D.  Have a condition that makes you cough frequently. What are the signs or symptoms? The main symptom of this condition is chest pain. The pain:  Usually starts gradually and can be sharp or dull.  Gets worse with deep breathing, coughing, or exercise.  Gets better with rest.  May be worse when you press on the sternum-rib connection (tenderness). How is this diagnosed? This condition is diagnosed based on your symptoms, medical history, and a physical exam. Your health care provider will check for tenderness when pressing on your sternum. This is the most important finding. You may also have tests to rule out other causes of chest pain. These may include:  A chest X-ray to check for lung problems.  An electrocardiogram (ECG) to see if you have a heart problem that could be causing the pain.  An imaging scan to rule out a chest or rib fracture. How is this treated? This condition usually goes away on its own over time. Your health care provider may prescribe an NSAID to reduce pain and inflammation. Your health care provider may also suggest that you:  Rest and avoid activities that make pain worse.  Apply heat or cold to the area to reduce pain and  inflammation.  Do exercises to stretch your chest muscles. If these treatments do not help, your health care provider may inject a numbing medicine at the sternum-rib connection to help relieve the pain. Follow these instructions at home:  Avoid activities that make pain worse. This includes any activities that use chest, abdominal, and side muscles.  If directed, put ice on the painful area:  Put ice in a plastic bag.  Place a towel between your skin and the bag.  Leave the ice on for 20 minutes, 2-3 times a day.  If directed, apply heat to the affected area as often as told by your health care provider. Use the heat source that your health care provider recommends, such as a moist heat pack or a heating pad.  Place a towel between your skin and the heat source.  Leave the heat on for 20-30 minutes.  Remove the heat if your skin turns bright red. This is especially important if you are unable to feel pain, heat, or cold. You may have a greater risk of getting burned.  Take over-the-counter and prescription medicines only as told by your health care provider.  Return to your normal activities as told by your health care provider. Ask your health care provider what activities are safe for you.  Keep all follow-up visits as told by your health care provider. This is important. Contact a health care provider if:  You have chills or a fever.  Your pain does not go away or it gets worse.    You have a cough that does not go away (is persistent). Get help right away if:  You have shortness of breath. This information is not intended to replace advice given to you by your health care provider. Make sure you discuss any questions you have with your health care provider. Document Released: 11/20/2004 Document Revised: 08/31/2015 Document Reviewed: 06/06/2015 Elsevier Interactive Patient Education  2017 [employer] Inc[employer]  

## 2016-06-13 NOTE — Progress Notes (Signed)
Patient: Veronica Lozano Female    DOB: Nov 23, 1965   51 y.o.   MRN: [id number] Visit Date: 06/13/2016  Today's Provider: Margaretann Loveless, PA-C   Chief Complaint  Patient presents with  . Right breast/rib pain   Subjective:    HPI Patient is here today with c/o pain under right breast. She reports the pain "is the same" since 03/08. She reports it hurts with cough, sneezing or when she stretches her right arm up.  She also has c/o upper leg pain. She stands the whole day at work and associates the pain/cramping from that. She reports she had to work 13 hours straight standing on a small mat that did not give much support. She does report some improvement with having a day off.     Allergies  Allergen Reactions  . Coconut Oil Hives  . Dapagliflozin     Other reaction(s): Other (See Comments) Yeast infection  . Empagliflozin     Other reaction(s): Other (See Comments) Yeast infection  . Glipizide Hives  . Latex Hives  . Metformin Hives  . Pantoprazole     Other reaction(s): Vomiting  . Morphine And Related Itching and Rash     Current Outpatient Prescriptions:  .  acyclovir (ZOVIRAX) 800 MG tablet, Take 1 tablet (800 mg total) by mouth as needed., Disp: 90 tablet, Rfl: 3 .  atorvastatin (LIPITOR) 20 MG tablet, Take 20 mg by mouth daily., Disp: , Rfl: 0 .  clonazePAM (KLONOPIN) 1 MG tablet, Take 1 tablet (1 mg total) by mouth at bedtime., Disp: 30 tablet, Rfl: 5 .  gabapentin (NEURONTIN) 300 MG capsule, Take 300 mg by mouth at bedtime. , Disp: , Rfl: 0 .  insulin aspart (NOVOLOG) 100 UNIT/ML injection, Inject 19 Units into the skin 3 (three) times daily before meals. Sliding scale, Disp: , Rfl:  .  insulin glargine (LANTUS) 100 UNIT/ML injection, Inject 0.35 mLs (35 Units total) into the skin at bedtime., Disp: 10 mL, Rfl: 11 .  oxyCODONE-acetaminophen (ROXICET) 5-325 MG tablet, Take 1 tablet by mouth every 4 (four) hours as needed for moderate pain or severe  pain., Disp: 120 tablet, Rfl: 0 .  Pancrelipase, Lip-Prot-Amyl, (CREON) 24000 UNITS CPEP, Take 24,000 Units by mouth 3 (three) times daily with meals. 2 tablets with meals and 1 tablet with snacks, Disp: , Rfl:  .  pioglitazone (ACTOS) 30 MG tablet, Take 30 mg by mouth daily. , Disp: , Rfl:  .  promethazine (PHENERGAN) 25 MG tablet, Take 25 mg by mouth every 8 (eight) hours as needed for nausea or vomiting. Reported on 04/02/2015, Disp: 20 tablet, Rfl: 0 .  cyclobenzaprine (FLEXERIL) 5 MG tablet, Take 1 tablet (5 mg total) by mouth 3 (three) times daily as needed for muscle spasms. (Patient not taking: Reported on 06/13/2016), Disp: 30 tablet, Rfl: 1 .  meloxicam (MOBIC) 15 MG tablet, Take 1 tablet (15 mg total) by mouth daily. (Patient not taking: Reported on 06/03/2016), Disp: 30 tablet, Rfl: 0 .  mirabegron ER (MYRBETRIQ) 25 MG TB24 tablet, Take 1 tablet (25 mg total) by mouth daily. (Patient not taking: Reported on 06/03/2016), Disp: 30 tablet, Rfl: 0 .  varenicline (CHANTIX PAK) 0.5 MG X 11 & 1 MG X 42 tablet, Take one 0.5 mg tablet by mouth once daily for 3 days, then increase to one 0.5 mg tablet twice daily for 4 days, then increase to one 1 mg tablet twice daily. (Patient not taking: Reported  on 05/13/2016), Disp: 53 tablet, Rfl: 0 .  zaleplon (SONATA) 5 MG capsule, Take 1 capsule (5 mg total) by mouth at bedtime as needed for sleep. (Patient not taking: Reported on 06/03/2016), Disp: 30 capsule, Rfl: 3  Review of Systems  Constitutional: Negative.   Respiratory: Negative for cough, chest tightness, shortness of breath and wheezing.   Cardiovascular: Positive for chest pain (more under right breast) and leg swelling (Only at night after standing the whole day.). Negative for palpitations.  Gastrointestinal: Negative for abdominal pain, constipation, diarrhea, nausea and vomiting.  Musculoskeletal: Positive for myalgias. Negative for back pain, gait problem and joint swelling.  Neurological:  Negative for dizziness and headaches.    Social History  Substance Use Topics  . Smoking status: Current Every Day Smoker    Packs/day: 0.50    Types: Cigarettes  . Smokeless tobacco: Never Used  . Alcohol use No   Objective:   BP 110/70 (BP Location: Left Arm, Patient Position: Sitting, Cuff Size: Normal)   Pulse 100   Temp 97.8 F (36.6 C) (Oral)   Resp 16   Wt 153 lb (69.4 kg)   BMI 30.90 kg/m    Physical Exam  Constitutional: She appears well-developed and well-nourished. No distress.  Neck: Normal range of motion. Neck supple.  Cardiovascular: Normal rate, regular rhythm and normal heart sounds.  Exam reveals no gallop and no friction rub.   No murmur heard. Pulmonary/Chest: Effort normal and breath sounds normal. No respiratory distress. She has no wheezes. She has no rales. She exhibits tenderness (tender to palpation and deep breathing under right breast and right flank).  Musculoskeletal: Normal range of motion. She exhibits no edema.       Right hip: Normal.       Left hip: Normal.       Right knee: Normal.       Left knee: Normal.       Lumbar back: Normal.  Skin: She is not diaphoretic.  Vitals reviewed.     Assessment & Plan:     1. Costochondritis Advised patient costochondritis can take 8-12 weeks to fully improve. She declines wanting prednisone taper for inflammation since she is T2DM and finally has her A1c under control. Will continue meloxicam as below. She is to call if she develops SOB or worsening chest pain. - meloxicam (MOBIC) 15 MG tablet; Take 1 tablet (15 mg total) by mouth daily.  Dispense: 30 tablet; Refill: 0       Margaretann Loveless, PA-C  [facility]

## 2016-07-11 ENCOUNTER — Ambulatory Visit
Admission: RE | Admit: 2016-07-11 | Discharge: 2016-07-11 | Disposition: A | Discharge status: Home / Self Care | Payer: BLUE CROSS/BLUE SHIELD | Source: Ambulatory Visit | Attending: Physician Assistant | Admitting: Physician Assistant

## 2016-07-11 ENCOUNTER — Ambulatory Visit (INDEPENDENT_AMBULATORY_CARE_PROVIDER_SITE_OTHER): Payer: BLUE CROSS/BLUE SHIELD | Admitting: Physician Assistant

## 2016-07-11 ENCOUNTER — Encounter: Payer: Self-pay | Admitting: Physician Assistant

## 2016-07-11 VITALS — BP 138/80 | HR 119 | Temp 97.8°F | Resp 16 | Wt 154.2 lb

## 2016-07-11 DIAGNOSIS — W109XXA Fall (on) (from) unspecified stairs and steps, initial encounter: Secondary | ICD-10-CM

## 2016-07-11 DIAGNOSIS — Z79891 Long term (current) use of opiate analgesic: Secondary | ICD-10-CM | POA: Diagnosis not present

## 2016-07-11 DIAGNOSIS — M25561 Pain in right knee: Secondary | ICD-10-CM | POA: Insufficient documentation

## 2016-07-11 DIAGNOSIS — G894 Chronic pain syndrome: Secondary | ICD-10-CM | POA: Diagnosis not present

## 2016-07-11 MED ORDER — OXYCODONE-ACETAMINOPHEN 5-325 MG PO TABS: 1.0000 | ORAL_TABLET | ORAL | 0 refills | Status: DC | PRN

## 2016-07-11 NOTE — Patient Instructions (Signed)
Fall Prevention in the Home Falls can cause injuries and can affect people from all age groups. There are many simple things that you can do to make your home safe and to help prevent falls. What can I do on the outside of my home?  Regularly repair the edges of walkways and driveways and fix any cracks.  Remove high doorway thresholds.  Trim any shrubbery on the main path into your home.  Use bright outdoor lighting.  Clear walkways of debris and clutter, including tools and rocks.  Regularly check that handrails are securely fastened and in good repair. Both sides of any steps should have handrails.  Install guardrails along the edges of any raised decks or porches.  Have leaves, snow, and ice cleared regularly.  Use sand or salt on walkways during winter months.  In the garage, clean up any spills right away, including grease or oil spills. What can I do in the bathroom?  Use night lights.  Install grab bars by the toilet and in the tub and shower. Do not use towel bars as grab bars.  Use non-skid mats or decals on the floor of the tub or shower.  If you need to sit down while you are in the shower, use a plastic, non-slip stool.  Keep the floor dry. Immediately clean up any water that spills on the floor.  Remove soap buildup in the tub or shower on a regular basis.  Attach bath mats securely with double-sided non-slip rug tape.  Remove throw rugs and other tripping hazards from the floor. What can I do in the bedroom?  Use night lights.  Make sure that a bedside light is easy to reach.  Do not use oversized bedding that drapes onto the floor.  Have a firm chair that has side arms to use for getting dressed.  Remove throw rugs and other tripping hazards from the floor. What can I do in the kitchen?  Clean up any spills right away.  Avoid walking on wet floors.  Place frequently used items in easy-to-reach places.  If you need to reach for something above  you, use a sturdy step stool that has a grab bar.  Keep electrical cables out of the way.  Do not use floor polish or wax that makes floors slippery. If you have to use wax, make sure that it is non-skid floor wax.  Remove throw rugs and other tripping hazards from the floor. What can I do in the stairways?  Do not leave any items on the stairs.  Make sure that there are handrails on both sides of the stairs. Fix handrails that are broken or loose. Make sure that handrails are as long as the stairways.  Check any carpeting to make sure that it is firmly attached to the stairs. Fix any carpet that is loose or worn.  Avoid having throw rugs at the top or bottom of stairways, or secure the rugs with carpet tape to prevent them from moving.  Make sure that you have a light switch at the top of the stairs and the bottom of the stairs. If you do not have them, have them installed. What are some other fall prevention tips?  Wear closed-toe shoes that fit well and support your feet. Wear shoes that have rubber soles or low heels.  When you use a stepladder, make sure that it is completely opened and that the sides are firmly locked. Have someone hold the ladder while you are using   it. Do not climb a closed stepladder.  Add color or contrast paint or tape to grab bars and handrails in your home. Place contrasting color strips on the first and last steps.  Use mobility aids as needed, such as canes, walkers, scooters, and crutches.  Turn on lights if it is dark. Replace any light bulbs that burn out.  Set up furniture so that there are clear paths. Keep the furniture in the same spot.  Fix any uneven floor surfaces.  Choose a carpet design that does not hide the edge of steps of a stairway.  Be aware of any and all pets.  Review your medicines with your healthcare provider. Some medicines can cause dizziness or changes in blood pressure, which increase your risk of falling. Talk with  your health care provider about other ways that you can decrease your risk of falls. This may include working with a physical therapist or trainer to improve your strength, balance, and endurance. This information is not intended to replace advice given to you by your health care provider. Make sure you discuss any questions you have with your health care provider. Document Released: 01/31/2002 Document Revised: 07/10/2015 Document Reviewed: 03/17/2014 Elsevier Interactive Patient Education  2017 [employer]vier Inc.  

## 2016-07-11 NOTE — Progress Notes (Signed)
Patient: Veronica MilchRoma J Rakers Female    DOB: 1965/06/01   51 y.o.   MRN: [id number] Visit Date: 07/11/2016  Today's Provider: Margaretann LovelessJennifer M Tosha Belgarde, PA-C   Chief Complaint  Patient presents with  . Fall   Subjective:    Fall  The accident occurred 6 to 12 hours ago. The fall occurred while walking (Pt fell off porch going down the steps). She fell from an unknown height. She landed on concrete. The point of impact was the right knee and right elbow (Lesion on her knee). The pain is present in the right knee (behind her knee). The pain is at a severity of 6/10. The pain is moderate. The symptoms are aggravated by movement. Pertinent negatives include no headaches or visual change. She has tried elevation (She took Roxicet) for the symptoms. The treatment provided mild relief.      Allergies  Allergen Reactions  . Coconut Oil Hives  . Dapagliflozin     Other reaction(s): Other (See Comments) Yeast infection  . Empagliflozin     Other reaction(s): Other (See Comments) Yeast infection  . Glipizide Hives  . Latex Hives  . Metformin Hives  . Pantoprazole     Other reaction(s): Vomiting  . Morphine And Related Itching and Rash     Current Outpatient Prescriptions:  .  acyclovir (ZOVIRAX) 800 MG tablet, Take 1 tablet (800 mg total) by mouth as needed., Disp: 90 tablet, Rfl: 3 .  atorvastatin (LIPITOR) 20 MG tablet, Take 20 mg by mouth daily., Disp: , Rfl: 0 .  clonazePAM (KLONOPIN) 1 MG tablet, Take 1 tablet (1 mg total) by mouth at bedtime., Disp: 30 tablet, Rfl: 5 .  gabapentin (NEURONTIN) 300 MG capsule, Take 300 mg by mouth at bedtime. , Disp: , Rfl: 0 .  insulin aspart (NOVOLOG) 100 UNIT/ML injection, Inject 19 Units into the skin 3 (three) times daily before meals. Sliding scale, Disp: , Rfl:  .  insulin glargine (LANTUS) 100 UNIT/ML injection, Inject 0.35 mLs (35 Units total) into the skin at bedtime., Disp: 10 mL, Rfl: 11 .  meloxicam (MOBIC) 15 MG tablet, Take 1 tablet  (15 mg total) by mouth daily., Disp: 30 tablet, Rfl: 0 .  oxyCODONE-acetaminophen (ROXICET) 5-325 MG tablet, Take 1 tablet by mouth every 4 (four) hours as needed for moderate pain or severe pain., Disp: 120 tablet, Rfl: 0 .  Pancrelipase, Lip-Prot-Amyl, (CREON) 24000 UNITS CPEP, Take 24,000 Units by mouth 3 (three) times daily with meals. 2 tablets with meals and 1 tablet with snacks, Disp: , Rfl:  .  pioglitazone (ACTOS) 30 MG tablet, Take 30 mg by mouth daily. , Disp: , Rfl:  .  promethazine (PHENERGAN) 25 MG tablet, Take 25 mg by mouth every 8 (eight) hours as needed for nausea or vomiting. Reported on 04/02/2015, Disp: 20 tablet, Rfl: 0 .  mirabegron ER (MYRBETRIQ) 25 MG TB24 tablet, Take 1 tablet (25 mg total) by mouth daily. (Patient not taking: Reported on 06/03/2016), Disp: 30 tablet, Rfl: 0 .  varenicline (CHANTIX PAK) 0.5 MG X 11 & 1 MG X 42 tablet, Take one 0.5 mg tablet by mouth once daily for 3 days, then increase to one 0.5 mg tablet twice daily for 4 days, then increase to one 1 mg tablet twice daily. (Patient not taking: Reported on 05/13/2016), Disp: 53 tablet, Rfl: 0 .  zaleplon (SONATA) 5 MG capsule, Take 1 capsule (5 mg total) by mouth at bedtime as needed for sleep. (  Patient not taking: Reported on 06/03/2016), Disp: 30 capsule, Rfl: 3  Review of Systems  Constitutional: Negative.   Respiratory: Negative for cough, chest tightness and shortness of breath.   Cardiovascular: Negative for chest pain, palpitations and leg swelling.  Musculoskeletal: Positive for arthralgias and joint swelling. Negative for back pain, gait problem, myalgias, neck pain and neck stiffness.  Skin: Positive for wound.  Neurological: Negative for dizziness, light-headedness and headaches.    Social History  Substance Use Topics  . Smoking status: Current Every Day Smoker    Packs/day: 0.50    Types: Cigarettes  . Smokeless tobacco: Never Used  . Alcohol use No   Objective:   BP 138/80 (BP Location:  Right Arm, Patient Position: Sitting, Cuff Size: Normal)   Pulse (!) 119   Temp 97.8 F (36.6 C) (Oral)   Resp 16   Wt 154 lb 3.2 oz (69.9 kg)   BMI 31.14 kg/m    Physical Exam  Constitutional: She appears well-developed and well-nourished. No distress.  Cardiovascular: Normal rate, regular rhythm and normal heart sounds.  Exam reveals no gallop and no friction rub.   No murmur heard. Pulmonary/Chest: Effort normal and breath sounds normal. No respiratory distress. She has no wheezes. She has no rales.  Musculoskeletal:       Right elbow: She exhibits laceration. She exhibits normal range of motion, no swelling, no effusion and no deformity. No tenderness found.       Right knee: She exhibits decreased range of motion, swelling and laceration. She exhibits no effusion, no ecchymosis, no deformity, no erythema, normal alignment, no LCL laxity, normal patellar mobility, no bony tenderness, normal meniscus and no MCL laxity. Tenderness found. Lateral joint line tenderness noted.       Arms:      Legs: Skin: She is not diaphoretic.  Vitals reviewed.  CLINICAL DATA:  Fall.  Pain.  EXAM: RIGHT KNEE - COMPLETE 4+ VIEW  COMPARISON:  Chest x-ray 04/18/2016 .  FINDINGS: No acute bony or joint abnormality identified. No evidence of fracture or dislocation.  IMPRESSION: No acute abnormality.   Electronically Signed   By: Maisie Fus  Register   On: 07/11/2016 09:31     Assessment & Plan:     1. Fall (on) (from) unspecified stairs and steps, initial encounter Abrasions noted on the rigt knee just distal to patella and right posterior elbow just near the olecranon process. Normal healing stages. No severe edema or decreased ROM. No deformities. No ecchymosis. Will get xray of knee as below since she is point tender over fibular head to make sure no fractures. Continue RICE. Call if symptoms worsen. - DG Knee Complete 4 Views Right; Future  *Addend: XRAY normal. See report above.     2. Long term current use of opiate analgesic Stable. Diagnosis pulled for medication refill. Continue current medical treatment plan. - oxyCODONE-acetaminophen (ROXICET) 5-325 MG tablet; Take 1 tablet by mouth every 4 (four) hours as needed for moderate pain or severe pain.  Dispense: 120 tablet; Refill: 0  3. Chronic pain syndrome Stable. Diagnosis pulled for medication refill. Continue current medical treatment plan. - oxyCODONE-acetaminophen (ROXICET) 5-325 MG tablet; Take 1 tablet by mouth every 4 (four) hours as needed for moderate pain or severe pain.  Dispense: 120 tablet; Refill: 0       Margaretann Loveless, PA-C  [facility]

## 2016-07-19 ENCOUNTER — Emergency Department: Payer: BLUE CROSS/BLUE SHIELD

## 2016-07-19 ENCOUNTER — Emergency Department
Admission: EM | Admit: 2016-07-19 | Discharge: 2016-07-19 | Disposition: A | Discharge status: Home / Self Care | Payer: BLUE CROSS/BLUE SHIELD | Attending: Emergency Medicine | Admitting: Emergency Medicine

## 2016-07-19 DIAGNOSIS — S3992XA Unspecified injury of lower back, initial encounter: Secondary | ICD-10-CM | POA: Diagnosis present

## 2016-07-19 DIAGNOSIS — M545 Low back pain, unspecified: Secondary | ICD-10-CM

## 2016-07-19 DIAGNOSIS — M533 Sacrococcygeal disorders, not elsewhere classified: Secondary | ICD-10-CM

## 2016-07-19 DIAGNOSIS — Y929 Unspecified place or not applicable: Secondary | ICD-10-CM | POA: Diagnosis not present

## 2016-07-19 DIAGNOSIS — Y939 Activity, unspecified: Secondary | ICD-10-CM | POA: Insufficient documentation

## 2016-07-19 DIAGNOSIS — Y999 Unspecified external cause status: Secondary | ICD-10-CM | POA: Diagnosis not present

## 2016-07-19 MED ORDER — LIDOCAINE 5 % EX PTCH
1.0000 | MEDICATED_PATCH | CUTANEOUS | Status: DC
  Administered 2016-07-19: 1 via TRANSDERMAL
  Filled 2016-07-19: qty 1

## 2016-07-19 MED ORDER — LIDOCAINE 5 % EX PTCH: 1.0000 | MEDICATED_PATCH | Freq: Two times a day (BID) | CUTANEOUS | 0 refills | Status: DC
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
## 2016-07-19 NOTE — Discharge Instructions (Signed)
Please follow up with the acute care clinic °
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
## 2016-07-22 ENCOUNTER — Telehealth: Payer: Self-pay | Admitting: Physician Assistant
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
## 2016-07-22 NOTE — Telephone Encounter (Signed)
Please review

## 2016-07-22 NOTE — Telephone Encounter (Signed)
Pt informed. She prefers to wait until appointment tomorrow. Allene DillonEmily Drozdowski, CMA

## 2016-07-22 NOTE — Telephone Encounter (Signed)
Unfortunately I cannot see her today. It is ok to see her tomorrow and I can write a note to cover today as well or she can see another provider if anyone available.

## 2016-07-23 ENCOUNTER — Ambulatory Visit (INDEPENDENT_AMBULATORY_CARE_PROVIDER_SITE_OTHER): Payer: BLUE CROSS/BLUE SHIELD | Admitting: Physician Assistant

## 2016-07-23 ENCOUNTER — Encounter: Payer: Self-pay | Admitting: Physician Assistant

## 2016-07-23 DIAGNOSIS — M545 Low back pain: Secondary | ICD-10-CM | POA: Diagnosis not present

## 2016-07-23 DIAGNOSIS — M533 Sacrococcygeal disorders, not elsewhere classified: Secondary | ICD-10-CM
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
## 2016-08-11 ENCOUNTER — Ambulatory Visit (INDEPENDENT_AMBULATORY_CARE_PROVIDER_SITE_OTHER): Payer: BLUE CROSS/BLUE SHIELD | Admitting: Physician Assistant

## 2016-08-11 ENCOUNTER — Encounter: Payer: Self-pay | Admitting: Physician Assistant

## 2016-08-11 VITALS — BP 110/72 | HR 108 | Temp 98.4°F | Resp 20 | Wt 155.0 lb

## 2016-08-11 DIAGNOSIS — M542 Cervicalgia: Secondary | ICD-10-CM

## 2016-08-11 DIAGNOSIS — M545 Low back pain, unspecified: Secondary | ICD-10-CM

## 2016-08-11 DIAGNOSIS — M62838 Other muscle spasm: Secondary | ICD-10-CM | POA: Diagnosis not present

## 2016-08-11 DIAGNOSIS — Z79891 Long term (current) use of opiate analgesic: Secondary | ICD-10-CM

## 2016-08-11 DIAGNOSIS — M546 Pain in thoracic spine: Secondary | ICD-10-CM | POA: Diagnosis not present

## 2016-08-11 DIAGNOSIS — G894 Chronic pain syndrome: Secondary | ICD-10-CM

## 2016-08-11 MED ORDER — CYCLOBENZAPRINE HCL 10 MG PO TABS: 10.0000 mg | ORAL_TABLET | Freq: Three times a day (TID) | ORAL | 0 refills | Status: DC | PRN

## 2016-08-11 MED ORDER — OXYCODONE-ACETAMINOPHEN 5-325 MG PO TABS: 1.0000 | ORAL_TABLET | ORAL | 0 refills | Status: DC | PRN
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
## 2016-08-12 ENCOUNTER — Ambulatory Visit
Admission: RE | Admit: 2016-08-12 | Discharge: 2016-08-12 | Disposition: A | Discharge status: Home / Self Care | Payer: BLUE CROSS/BLUE SHIELD | Source: Ambulatory Visit | Attending: Physician Assistant | Admitting: Physician Assistant

## 2016-08-12 DIAGNOSIS — M545 Low back pain, unspecified: Secondary | ICD-10-CM

## 2016-08-12 DIAGNOSIS — M47896 Other spondylosis, lumbar region: Secondary | ICD-10-CM | POA: Diagnosis not present

## 2016-08-12 DIAGNOSIS — M542 Cervicalgia: Secondary | ICD-10-CM

## 2016-08-12 DIAGNOSIS — T1490XA Injury, unspecified, initial encounter: Secondary | ICD-10-CM | POA: Diagnosis present

## 2016-08-12 DIAGNOSIS — M546 Pain in thoracic spine: Secondary | ICD-10-CM

## 2016-08-12 DIAGNOSIS — M5134 Other intervertebral disc degeneration, thoracic region: Secondary | ICD-10-CM | POA: Diagnosis not present

## 2016-08-12 DIAGNOSIS — M549 Dorsalgia, unspecified: Secondary | ICD-10-CM | POA: Diagnosis present

## 2016-08-13 NOTE — Progress Notes (Signed)
Advised  ED 

## 2016-08-19 ENCOUNTER — Encounter: Payer: Self-pay | Admitting: Physician Assistant

## 2016-08-23 ENCOUNTER — Emergency Department: Payer: BLUE CROSS/BLUE SHIELD

## 2016-08-23 ENCOUNTER — Emergency Department
Admission: EM | Admit: 2016-08-23 | Discharge: 2016-08-23 | Discharge status: Against Medical Advice | Payer: BLUE CROSS/BLUE SHIELD | Attending: Emergency Medicine | Admitting: Emergency Medicine

## 2016-08-23 ENCOUNTER — Encounter: Payer: Self-pay | Admitting: Emergency Medicine

## 2016-08-23 DIAGNOSIS — Z5321 Procedure and treatment not carried out due to patient leaving prior to being seen by health care provider: Secondary | ICD-10-CM | POA: Insufficient documentation

## 2016-08-23 DIAGNOSIS — R109 Unspecified abdominal pain: Secondary | ICD-10-CM | POA: Insufficient documentation

## 2016-08-23 DIAGNOSIS — R079 Chest pain, unspecified: Secondary | ICD-10-CM | POA: Insufficient documentation

## 2016-08-23 LAB — COMPREHENSIVE METABOLIC PANEL
ALT: 16 U/L (ref 14–54)
ANION GAP: 9 (ref 5–15)
AST: 20 U/L (ref 15–41)
Albumin: 4.4 g/dL (ref 3.5–5.0)
Alkaline Phosphatase: 73 U/L (ref 38–126)
BUN: 7 mg/dL (ref 6–20)
CHLORIDE: 99 mmol/L — AB (ref 101–111)
CO2: 25 mmol/L (ref 22–32)
Calcium: 9.5 mg/dL (ref 8.9–10.3)
Creatinine, Ser: 0.57 mg/dL (ref 0.44–1.00)
GFR calc non Af Amer: >60 mL/min (ref >60)
Glucose, Bld: 162 mg/dL — ABNORMAL HIGH (ref 65–99)
Potassium: 3.9 mmol/L (ref 3.5–5.1)
Sodium: 133 mmol/L — ABNORMAL LOW (ref 135–145)
Total Bilirubin: 1.1 mg/dL (ref 0.3–1.2)
Total Protein: 8.1 g/dL (ref 6.5–8.1)

## 2016-08-23 LAB — CBC
HCT: 49.3 % — ABNORMAL HIGH (ref 35.0–47.0)
HEMOGLOBIN: 17 g/dL — AB (ref 12.0–16.0)
MCH: 30.4 pg (ref 26.0–34.0)
MCHC: 34.5 g/dL (ref 32.0–36.0)
MCV: 88.3 fL (ref 80.0–100.0)
Platelets: 266 10*3/uL (ref 150–440)
RBC: 5.59 MIL/uL — ABNORMAL HIGH (ref 3.80–5.20)
RDW: 12.8 % (ref 11.5–14.5)
WBC: 9.7 10*3/uL (ref 3.6–11.0)

## 2016-08-23 LAB — TROPONIN I

## 2016-08-23 LAB — LIPASE, BLOOD: Lipase: 33 U/L (ref 11–51)

## 2016-08-23 NOTE — ED Notes (Signed)
Pt up to stat registration stating she is going home; says she has nausea and pain medication at home and she's not waiting any longer

## 2016-08-23 NOTE — ED Notes (Signed)
Pt ambulatory with steady gait to registration desk; inquiring about the time; pt says she left all her pain medication at home; understands she will be taken to treatment room as soon as one is available

## 2016-08-23 NOTE — ED Triage Notes (Signed)
Pt to triage via wheelchair. Pt reports started about 10 days ago with epigastric pain that radiates into her back and now is radiating into her chest. Pt reports +n/v x 4 over the last 24 hours and diarrhea a few days ago. Pt also reports hx of pancreatitis. Pt using percocet and phenergan at home for her pain.

## 2016-08-23 NOTE — ED Notes (Signed)
EKG preformed by this tech.

## 2016-08-25 ENCOUNTER — Encounter: Payer: Self-pay | Admitting: Physician Assistant

## 2016-08-26 ENCOUNTER — Ambulatory Visit (INDEPENDENT_AMBULATORY_CARE_PROVIDER_SITE_OTHER): Payer: BLUE CROSS/BLUE SHIELD | Admitting: Physician Assistant

## 2016-08-26 ENCOUNTER — Encounter: Payer: Self-pay | Admitting: Physician Assistant

## 2016-08-26 VITALS — BP 142/90 | HR 108 | Temp 98.4°F | Resp 18 | Wt 148.2 lb

## 2016-08-26 DIAGNOSIS — R109 Unspecified abdominal pain: Secondary | ICD-10-CM

## 2016-08-26 DIAGNOSIS — K861 Other chronic pancreatitis: Secondary | ICD-10-CM

## 2016-08-26 DIAGNOSIS — G894 Chronic pain syndrome: Secondary | ICD-10-CM

## 2016-08-26 DIAGNOSIS — M545 Low back pain, unspecified: Secondary | ICD-10-CM

## 2016-08-26 DIAGNOSIS — R110 Nausea: Secondary | ICD-10-CM

## 2016-08-26 DIAGNOSIS — Z79891 Long term (current) use of opiate analgesic: Secondary | ICD-10-CM

## 2016-08-26 LAB — POCT URINALYSIS DIPSTICK
Bilirubin, UA: NEGATIVE
GLUCOSE UA: NEGATIVE
Ketones, UA: NEGATIVE
Leukocytes, UA: NEGATIVE
Nitrite, UA: NEGATIVE
Protein, UA: NEGATIVE
Spec Grav, UA: >=1.03 — AB (ref 1.010–1.025)
UROBILINOGEN UA: 0.2 U/dL
pH, UA: 6 (ref 5.0–8.0)

## 2016-08-26 MED ORDER — PROMETHAZINE HCL 25 MG PO TABS: 25.0000 mg | ORAL_TABLET | Freq: Three times a day (TID) | ORAL | 5 refills | Status: DC | PRN

## 2016-08-26 MED ORDER — OXYCODONE-ACETAMINOPHEN 5-325 MG PO TABS: 1.0000 | ORAL_TABLET | ORAL | 0 refills | Status: DC | PRN

## 2016-08-26 MED ORDER — SULFAMETHOXAZOLE-TRIMETHOPRIM 800-160 MG PO TABS: 1.0000 | ORAL_TABLET | Freq: Two times a day (BID) | ORAL | 0 refills | Status: DC

## 2016-08-26 NOTE — Patient Instructions (Signed)
Gastritis, Adult Gastritis is inflammation of the stomach. There are two kinds of gastritis:  Acute gastritis. This kind develops suddenly.  Chronic gastritis. This kind lasts for a long time.  Gastritis happens when the lining of the stomach becomes weak or gets damaged. Without treatment, gastritis can lead to stomach bleeding and ulcers. What are the causes? This condition may be caused by:  An infection.  Drinking too much alcohol.  Certain medicines.  Having too much acid in the stomach.  A disease of the intestines or stomach.  Stress.  What are the signs or symptoms? Symptoms of this condition include:  Pain or a burning in the upper abdomen.  Nausea.  Vomiting.  An uncomfortable feeling of fullness after eating.  In some cases, there are no symptoms. How is this diagnosed? This condition may be diagnosed with:  A description of your symptoms.  A physical exam.  Tests. These can include: ? Blood tests. ? Stool tests. ? A test in which a thin, flexible instrument with a light and camera on the end is passed down the esophagus and into the stomach (upper endoscopy). ? A test in which a sample of tissue is taken for testing (biopsy).  How is this treated? This condition may be treated with medicines. If the condition is caused by a bacterial infection, you may be given antibiotic medicines. If it is caused by too much acid in the stomach, you may get medicines called H2 blockers, proton pump inhibitors, or antacids. Treatment may also involve stopping the use of certain medicines, such as aspirin, ibuprofen, or other nonsteroidal anti-inflammatory drugs (NSAIDs). Follow these instructions at home:  Take over-the-counter and prescription medicines only as told by your health care provider.  If you were prescribed an antibiotic, take it as told by your health care provider. Do not stop taking the antibiotic even if you start to feel better.  Drink enough  fluid to keep your urine clear or pale yellow.  Eat small, frequent meals instead of large meals. Contact a health care provider if:  Your symptoms get worse.  Your symptoms return after treatment. Get help right away if:  You vomit blood or material that looks like coffee grounds.  You have black or dark red stools.  You are unable to keep fluids down.  Your abdominal pain gets worse.  You have a fever.  You do not feel better after 1 week. This information is not intended to replace advice given to you by your health care provider. Make sure you discuss any questions you have with your health care provider. Document Released: 02/04/2001 Document Revised: 10/10/2015 Document Reviewed: 11/04/2014 Elsevier Interactive Patient Education  2018 Elsevier Inc.  
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
## 2016-08-28 ENCOUNTER — Encounter: Payer: Self-pay | Admitting: Physician Assistant

## 2016-08-28 ENCOUNTER — Telehealth: Payer: Self-pay | Admitting: Physician Assistant

## 2016-08-28 NOTE — Telephone Encounter (Signed)
Approved # [id number] Valid through 08/28/16-09/26/16

## 2016-08-28 NOTE — Telephone Encounter (Signed)
Please review-aa 

## 2016-08-28 NOTE — Telephone Encounter (Signed)
Insurance company would like to speak peer to peer.Phone [phone].Member # [id number]

## 2016-09-01 ENCOUNTER — Other Ambulatory Visit: Payer: Self-pay | Admitting: Physician Assistant

## 2016-09-01 ENCOUNTER — Ambulatory Visit
Admission: RE | Admit: 2016-09-01 | Discharge: 2016-09-01 | Disposition: A | Discharge status: Home / Self Care | Payer: BLUE CROSS/BLUE SHIELD | Source: Ambulatory Visit | Attending: Physician Assistant | Admitting: Physician Assistant

## 2016-09-01 ENCOUNTER — Telehealth: Payer: Self-pay | Admitting: Physician Assistant

## 2016-09-01 ENCOUNTER — Telehealth: Payer: Self-pay

## 2016-09-01 DIAGNOSIS — R933 Abnormal findings on diagnostic imaging of other parts of digestive tract: Secondary | ICD-10-CM | POA: Insufficient documentation

## 2016-09-01 DIAGNOSIS — K861 Other chronic pancreatitis: Secondary | ICD-10-CM | POA: Diagnosis not present

## 2016-09-01 DIAGNOSIS — Z90710 Acquired absence of both cervix and uterus: Secondary | ICD-10-CM | POA: Diagnosis not present

## 2016-09-01 DIAGNOSIS — R109 Unspecified abdominal pain: Secondary | ICD-10-CM

## 2016-09-01 DIAGNOSIS — R1084 Generalized abdominal pain: Secondary | ICD-10-CM | POA: Insufficient documentation

## 2016-09-01 DIAGNOSIS — R935 Abnormal findings on diagnostic imaging of other abdominal regions, including retroperitoneum: Secondary | ICD-10-CM

## 2016-09-01 DIAGNOSIS — K869 Disease of pancreas, unspecified: Secondary | ICD-10-CM

## 2016-09-01 DIAGNOSIS — M545 Low back pain, unspecified: Secondary | ICD-10-CM

## 2016-09-01 DIAGNOSIS — F40240 Claustrophobia: Secondary | ICD-10-CM

## 2016-09-01 DIAGNOSIS — R110 Nausea: Secondary | ICD-10-CM | POA: Diagnosis not present

## 2016-09-01 MED ORDER — LORAZEPAM 0.5 MG PO TABS: ORAL_TABLET | ORAL | 0 refills | Status: DC

## 2016-09-01 NOTE — Telephone Encounter (Signed)
-----   Message from Margaretann LovelessJennifer M Burnette, [place]PA-C sent at 09/01/2016  1:06 PM EDT ----- Abdominal CT does show an enlargement of the pancreatic head. This was not noted on previous abdominal CT last year. It is possible this could be an area of focal pancreatitis located in the head of the pancreas, but pancreatic mass cannot be excluded. Recommended to have an abdominal MRI for further evaluation of the pancreas. I will place this order.

## 2016-09-01 NOTE — Progress Notes (Signed)
Please call in lorazepam 0.5mg  for patient to take one tablet 60 minutes prior to MRI and to take 2nd tab 15 minutes before MRI.

## 2016-09-01 NOTE — Telephone Encounter (Signed)
Insurance company would like to speak peer to peer to get MRI approved.Phone [phone].Member # [id number]

## 2016-09-01 NOTE — Telephone Encounter (Signed)
Patient advised as directed below. Patient is asking is there anything else that she needs to be doing while she waits for the MRI?  Thanks,  -Korayma Hagwood

## 2016-09-02 ENCOUNTER — Encounter: Payer: Self-pay | Admitting: Physician Assistant

## 2016-09-02 NOTE — Telephone Encounter (Signed)
Called in Prescription and LM voicemail for the pharmacist. Waited on the phone 15 minutes to talk to the one of the pharmacist, not successful.  Thanks,  -Joseline

## 2016-09-02 NOTE — Telephone Encounter (Signed)
Approved # [id number] July 9-Sep 30, 2016

## 2016-09-02 NOTE — Telephone Encounter (Signed)
Nothing else unfortunately to do. MRI was approved today. If pain is severe she may need to go to hospital just for pain management.

## 2016-09-02 NOTE — Telephone Encounter (Signed)
No answer  Thanks,  -Aubre Quincy 

## 2016-09-02 NOTE — Progress Notes (Signed)
Per provider will call patient this afternoon.

## 2016-09-02 NOTE — Telephone Encounter (Signed)
Provider will call patient this afternoon.  Thanks,  -Joseline

## 2016-09-02 NOTE — Progress Notes (Signed)
Prescription called [facility].

## 2016-09-02 NOTE — Telephone Encounter (Signed)
Advised patient as below. Patient reports that she is claustrophobic and she will need something to ease her anxiety while she gets her MRI done. Please send something into the pharmacy for her to take before she has her MRI. Thanks!

## 2016-09-03 ENCOUNTER — Telehealth: Payer: Self-pay | Admitting: Physician Assistant

## 2016-09-03 ENCOUNTER — Encounter: Payer: Self-pay | Admitting: Physician Assistant

## 2016-09-03 NOTE — Telephone Encounter (Signed)
Please review

## 2016-09-03 NOTE — Telephone Encounter (Signed)
Addressed in mychart message

## 2016-09-03 NOTE — Telephone Encounter (Signed)
[facility]

## 2016-09-03 NOTE — Telephone Encounter (Signed)
Pt called saying she will need something to take prior to her MRI on Friday for anxiety.  She uses [facility] [address].  Pt's call back is [phone]  Thanks Barth Kirksteri

## 2016-09-05 ENCOUNTER — Ambulatory Visit
Admission: RE | Admit: 2016-09-05 | Discharge: 2016-09-05 | Disposition: A | Discharge status: Home / Self Care | Payer: BLUE CROSS/BLUE SHIELD | Source: Ambulatory Visit | Attending: Physician Assistant | Admitting: Physician Assistant

## 2016-09-05 DIAGNOSIS — R935 Abnormal findings on diagnostic imaging of other abdominal regions, including retroperitoneum: Secondary | ICD-10-CM

## 2016-09-05 DIAGNOSIS — K869 Disease of pancreas, unspecified: Secondary | ICD-10-CM | POA: Diagnosis not present

## 2016-09-05 DIAGNOSIS — I700 Atherosclerosis of aorta: Secondary | ICD-10-CM | POA: Diagnosis not present

## 2016-09-05 MED ORDER — GADOBENATE DIMEGLUMINE 529 MG/ML IV SOLN
15.0000 mL | Freq: Once | INTRAVENOUS | Status: AC | PRN
  Administered 2016-09-05: 13 mL via INTRAVENOUS

## 2016-09-08 ENCOUNTER — Telehealth: Payer: Self-pay | Admitting: Physician Assistant

## 2016-09-08 ENCOUNTER — Encounter: Payer: Self-pay | Admitting: Physician Assistant

## 2016-09-08 ENCOUNTER — Other Ambulatory Visit: Payer: Self-pay | Admitting: Physician Assistant

## 2016-09-08 DIAGNOSIS — K869 Disease of pancreas, unspecified: Secondary | ICD-10-CM

## 2016-09-08 NOTE — Telephone Encounter (Signed)
Spoke with patient.

## 2016-09-08 NOTE — Telephone Encounter (Signed)
Please advise 

## 2016-09-08 NOTE — Telephone Encounter (Signed)
Pt is requesting results from her MRI that was done on Friday.  ZO#[phone]/[id number]#[phone]/MW

## 2016-09-08 NOTE — Progress Notes (Signed)
Patient has had persistent epigastric pain, known h/o chronic pancreatitis, x 4 weeks. CT abd revealed possible mass vs pancreatitis thus MRI was performed. 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 Recommended endoscopy with pancreatic biopsy. Referral placed.

## 2016-09-22 ENCOUNTER — Encounter: Payer: Self-pay | Admitting: Physician Assistant

## 2016-09-22 ENCOUNTER — Ambulatory Visit (INDEPENDENT_AMBULATORY_CARE_PROVIDER_SITE_OTHER): Payer: BLUE CROSS/BLUE SHIELD | Admitting: Physician Assistant

## 2016-09-22 VITALS — BP 120/88 | HR 70 | Temp 98.5°F | Resp 16 | Wt 148.2 lb

## 2016-09-22 DIAGNOSIS — Z6829 Body mass index (BMI) 29.0-29.9, adult: Secondary | ICD-10-CM | POA: Diagnosis not present

## 2016-09-22 DIAGNOSIS — Z79891 Long term (current) use of opiate analgesic: Secondary | ICD-10-CM | POA: Diagnosis not present

## 2016-09-22 DIAGNOSIS — G8929 Other chronic pain: Secondary | ICD-10-CM

## 2016-09-22 DIAGNOSIS — G894 Chronic pain syndrome: Secondary | ICD-10-CM | POA: Diagnosis not present

## 2016-09-22 DIAGNOSIS — Z794 Long term (current) use of insulin: Secondary | ICD-10-CM

## 2016-09-22 DIAGNOSIS — R1013 Epigastric pain: Secondary | ICD-10-CM

## 2016-09-22 DIAGNOSIS — K860 Alcohol-induced chronic pancreatitis: Secondary | ICD-10-CM | POA: Diagnosis not present

## 2016-09-22 DIAGNOSIS — E1142 Type 2 diabetes mellitus with diabetic polyneuropathy: Secondary | ICD-10-CM

## 2016-09-22 MED ORDER — OXYCODONE-ACETAMINOPHEN 5-325 MG PO TABS: 1.0000 | ORAL_TABLET | ORAL | 0 refills | Status: DC | PRN

## 2016-09-22 NOTE — Patient Instructions (Signed)
Chronic Pancreatitis Chronic pancreatitis is long-lasting inflammation and scarring of the pancreas. The pancreas is a gland that is located behind the stomach. It produces enzymes that help to digest food. The pancreas also releases the hormones glucagon and insulin, which help to regulate blood sugar. Damage to the pancreas may affect digestion, cause pain in the upper abdomen and back, and cause diabetes. Inflammation can also irritate other abdominal organs near the pancreas. At the very beginning, pancreatitis may be sudden (acute). If acute pancreatitis is not caught in time or treated effectively, or if you have several or prolonged episodes of acute pancreatitis, then the condition can turn into chronic pancreatitis. What are the causes? The most common cause of this condition is alcohol abuse. Other causes include:  High levels of triglycerides in the blood (hypertriglyceridemia).  Gallstones or other conditions that can block the tube that drains the pancreas (pancreatic duct).  Pancreatic cancer.  Cystic fibrosis.  Too much calcium in the blood (hypercalcemia), which may be caused by an overactive parathyroid gland (hyperparathyroidism).  Certain medicines.  Injury to the pancreas.  Infection.  Autoimmune pancreatitis. This is when the body's disease-fighting (immune) system attacks the pancreas.  Genes that are passed along from parent to child (inherited).  In some cases, the cause may not be known. What increases the risk? This condition is more likely to develop in:  Men.  People who are 40-60 years old.  What are the signs or symptoms? Symptoms of this condition may include:  Abdominal pain. Pain may also be felt in the upper back and may get worse after eating.  Nausea and vomiting.  Fever.  Weight loss.  A change in the color and consistency of bowel movements, such as diarrhea.  How is this diagnosed? This condition is diagnosed based on your  symptoms, your medical history, and a physical exam. You may have tests, such as:  Blood tests.  Stool samples.  Biopsy of the pancreas. This is the removal of a small amount of pancreas tissue to be tested in a lab.  Imaging studies, such as: ? X-rays. ? CT scan. ? MRI. ? Ultrasound.  How is this treated? The goal of treatment is to help relieve symptoms and to prevent complications from occurring. Treatment focuses on:  Resting the pancreas. You may need to stop eating and drinking for a few days while in the hospital to give your pancreas time to recover. During this time, you will be given IV fluids to keep you hydrated.  Controlling pain. You may be given pain medicines by mouth (orally) or as injections.  Improving digestion. You may be given: ? Medicines to help balance your enzymes. ? Vitamin supplements. ? A specific diet to follow. If you are given a diet, you may work with a specialist (dietitian).  Preventing diabetes. You may need insulin injections.  You may have surgery to:  Clear the pancreatic ducts of any blockages, such as gallstones.  Remove any fluid or damaged tissue from the pancreas.  Follow these instructions at home:  Take over-the-counter and prescription medicines only as told by your health care provider. This includes any vitamin supplements.  Do not drive or operate heavy machinery while taking prescription pain medicines.  Drink enough fluid to keep your urine clear or pale yellow.  Do not drink alcohol. If you need help quitting, ask your health care provider.  Do not use any tobacco products, such as cigarettes, chewing tobacco, and e-cigarettes. If you need help   quitting, ask your health care provider.  Follow a diet as told by your health care provider or dietitian, if this applies. This may include: ? Limiting how much fat you eat. ? Eating smaller meals more often. ? Avoiding caffeine.  Keep all follow-up visits as told by your  health care provider. This is important. Contact a health care provider if:  You have pain that does not get better with medicine.  You have a fever. Get help right away if:  Your pain suddenly gets worse.  You have sudden abdominal swelling.  You start to vomit often or you vomit blood.  You have diarrhea that does not go away.  You have blood in your stool. This information is not intended to replace advice given to you by your health care provider. Make sure you discuss any questions you have with your health care provider. Document Released: 03/09/2015 Document Revised: 07/19/2015 Document Reviewed: 01/18/2014 Elsevier Interactive Patient Education  2018 Elsevier Inc.  

## 2016-09-22 NOTE — Progress Notes (Addendum)
Patient: Veronica Lozano Female    DOB: 12-20-1965   51 y.o.   MRN: [id number] Visit Date: 09/22/2016  Today's Provider: Margaretann LovelessJennifer M Geza Beranek, PA-C   Chief Complaint  Patient presents with  . Follow-up   Subjective:    HPI  Follow up for chronic pancreatitis   The patient was last seen for this 4 weeks ago. Changes made at last visit include labs checked and [facility] for [facility] with transendoscopic ultrasound and biopsy with Dr. Council MechanicGangarosa. Biopsy revealed a blood clot with fragments of fibrotic pancreatic parenchyma, c/w chronic pancreatitis. She has an appt on 09/24/16 with Dr. Celine Mansesai [facility]).   Patient reports that she has been having fever, nausea on and off just about every day. Patient reports temp was 102.0 last night. She has been using tylenol for fevers.Patient reports she is having some depression problems with every thing going on. She is not wanting to start any mediations at this time as she feels this is situational. She feels that worrying about the unknown at this time and not being able to go and do like she used to is bothering her the most. She denies any suicidal ideations.  ------------------------------------------------------------------------------------     Allergies  Allergen Reactions  . Coconut Oil Hives  . Dapagliflozin     Other reaction(s): Other (See Comments) Yeast infection  . Empagliflozin     Other reaction(s): Other (See Comments) Yeast infection  . Glipizide Hives  . Latex Hives  . Metformin Hives  . Pantoprazole     Other reaction(s): Vomiting  . Morphine And Related Itching and Rash     Current Outpatient Prescriptions:  .  acyclovir (ZOVIRAX) 800 MG tablet, Take 1 tablet (800 mg total) by mouth as needed., Disp: 90 tablet, Rfl: 3 .  clonazePAM (KLONOPIN) 1 MG tablet, Take 1 tablet (1 mg total) by mouth at bedtime., Disp: 30 tablet, Rfl: 5 .  cyclobenzaprine (FLEXERIL) 10 MG tablet, Take 1 tablet (10 mg  total) by mouth 3 (three) times daily as needed for muscle spasms., Disp: 30 tablet, Rfl: 0 .  gabapentin (NEURONTIN) 300 MG capsule, Take 300 mg by mouth at bedtime. , Disp: , Rfl: 0 .  insulin aspart (NOVOLOG) 100 UNIT/ML injection, Inject 19 Units into the skin 3 (three) times daily before meals. Sliding scale, Disp: , Rfl:  .  insulin glargine (LANTUS) 100 UNIT/ML injection, Inject 0.35 mLs (35 Units total) into the skin at bedtime., Disp: 10 mL, Rfl: 11 .  oxyCODONE-acetaminophen (ROXICET) 5-325 MG tablet, Take 1 tablet by mouth every 4 (four) hours as needed for moderate pain or severe pain., Disp: 120 tablet, Rfl: 0 .  Pancrelipase, Lip-Prot-Amyl, (CREON) 24000 UNITS CPEP, Take 24,000 Units by mouth 3 (three) times daily with meals. 2 tablets with meals and 1 tablet with snacks, Disp: , Rfl:  .  pioglitazone (ACTOS) 30 MG tablet, Take 30 mg by mouth daily. , Disp: , Rfl:  .  promethazine (PHENERGAN) 25 MG tablet, Take 1 tablet (25 mg total) by mouth every 8 (eight) hours as needed for nausea or vomiting. Reported on 04/02/2015, Disp: 30 tablet, Rfl: 5 .  atorvastatin (LIPITOR) 20 MG tablet, Take 20 mg by mouth daily., Disp: , Rfl: 0  Review of Systems  Constitutional: Positive for activity change, appetite change and fever.  Cardiovascular: Negative.   Neurological: Positive for headaches.  Psychiatric/Behavioral: The patient is nervous/anxious.     Social History  Substance Use Topics  . Smoking status: Current Every Day Smoker    Packs/day: 0.50    Types: Cigarettes  . Smokeless tobacco: Never Used  . Alcohol use No   Objective:   BP 120/88 (BP Location: Left Arm, Patient Position: Sitting, Cuff Size: Large)   Pulse 70   Temp 98.5 F (36.9 C) (Oral)   Resp 16   Wt 148 lb 3.2 oz (67.2 kg)   SpO2 97%   BMI 29.93 kg/m  Vitals:   09/22/16 1551  BP: 120/88  Pulse: 70  Resp: 16  Temp: 98.5 F (36.9 C)  TempSrc: Oral  SpO2: 97%  Weight: 148 lb 3.2 oz (67.2 kg)      Physical Exam  Constitutional: She appears well-developed and well-nourished. No distress.  Neck: Normal range of motion. Neck supple.  Cardiovascular: Normal rate, regular rhythm and normal heart sounds.  Exam reveals no gallop and no friction rub.   No murmur heard. Pulmonary/Chest: Effort normal and breath sounds normal. No respiratory distress. She has no wheezes. She has no rales.  Abdominal: Soft. She exhibits no distension. Bowel sounds are decreased. There is tenderness in the right upper quadrant and epigastric area. There is guarding.  Skin: She is not diaphoretic.  Vitals reviewed.  Depression screen PHQ 2/9 09/22/2016  Decreased Interest 3  Down, Depressed, Hopeless 2  PHQ - 2 Score 5  Altered sleeping 3  Tired, decreased energy 3  Change in appetite 3  Feeling bad or failure about yourself  2  Trouble concentrating 2  Moving slowly or fidgety/restless 2  Suicidal thoughts 0  PHQ-9 Score 20        Assessment & Plan:     1. Alcohol-induced chronic pancreatitis [facility](HCC) Patient is seeing Dr. Celine Mans[facility]ai at [facility] on 09/24/16 for consideration of beta and islet cell transplants. Will await note for further f/u.   2. Type 2 diabetes mellitus with diabetic polyneuropathy, with long-term current use of insulin (HCC) Followed by Dr. Tedd SiasSolum, endocrinology.  3. Chronic abdominal pain (epigastric) (Location of Primary Source of Pain) See above medical treatment plan for #1.  4. BMI 29.0-29.9,adult Counseled patient on healthy lifestyle modifications including dieting and exercise.   5. Long term current use of opiate analgesic Stable. Diagnosis pulled for medication refill. Continue current medical treatment plan. - oxyCODONE-acetaminophen (ROXICET) 5-325 MG tablet; Take 1 tablet by mouth every 4 (four) hours as needed for moderate pain or severe pain.  Dispense: 120 tablet; Refill: 0  6. Chronic pain syndrome Stable. Diagnosis pulled for medication refill.  Continue current medical treatment plan. - oxyCODONE-acetaminophen (ROXICET) 5-325 MG tablet; Take 1 tablet by mouth every 4 (four) hours as needed for moderate pain or severe pain.  Dispense: 120 tablet; Refill: 0       Margaretann LovelessJennifer M Giavanna Kang, PA-C  [facility]

## 2016-09-25 ENCOUNTER — Encounter: Payer: Self-pay | Admitting: Physician Assistant

## 2016-10-06 ENCOUNTER — Encounter: Payer: Self-pay | Admitting: Physician Assistant

## 2016-10-06 DIAGNOSIS — K860 Alcohol-induced chronic pancreatitis: Secondary | ICD-10-CM

## 2016-10-06 DIAGNOSIS — R112 Nausea with vomiting, unspecified: Secondary | ICD-10-CM

## 2016-10-07 ENCOUNTER — Encounter: Payer: Self-pay | Admitting: Physician Assistant

## 2016-10-07 MED ORDER — TRIMETHOBENZAMIDE HCL 300 MG PO CAPS: 300.0000 mg | ORAL_CAPSULE | Freq: Three times a day (TID) | ORAL | 0 refills | Status: DC | PRN

## 2016-10-07 NOTE — Addendum Note (Signed)
Addended by: Margaretann LovelessBURNETTE, JENNIFER M on: 10/07/2016 04:47 PM   Modules accepted: Orders

## 2016-10-20 ENCOUNTER — Encounter: Payer: Self-pay | Admitting: Physician Assistant

## 2016-10-20 ENCOUNTER — Other Ambulatory Visit: Payer: Self-pay | Admitting: Physician Assistant

## 2016-10-20 DIAGNOSIS — Z79891 Long term (current) use of opiate analgesic: Secondary | ICD-10-CM

## 2016-10-20 DIAGNOSIS — G894 Chronic pain syndrome: Secondary | ICD-10-CM

## 2016-10-20 MED ORDER — OXYCODONE-ACETAMINOPHEN 5-325 MG PO TABS: 1.0000 | ORAL_TABLET | ORAL | 0 refills | Status: DC | PRN

## 2016-10-20 NOTE — Telephone Encounter (Signed)
Patient advised as directed below.  Thanks,  -Kirsta Probert 

## 2016-10-20 NOTE — Telephone Encounter (Signed)
Rx printed. NCCSR reviewed. Can we ask her how her new nausea medication is working? Thanks!

## 2016-10-27 ENCOUNTER — Encounter: Payer: Self-pay | Admitting: Physician Assistant

## 2016-10-27 DIAGNOSIS — F5101 Primary insomnia: Secondary | ICD-10-CM

## 2016-10-28 ENCOUNTER — Telehealth: Payer: Self-pay

## 2016-10-28 MED ORDER — TRAZODONE HCL 50 MG PO TABS: 50.0000 mg | ORAL_TABLET | Freq: Every evening | ORAL | 3 refills | Status: DC | PRN

## 2016-10-28 MED ORDER — CLONAZEPAM 0.5 MG PO TABS: 0.5000 mg | ORAL_TABLET | Freq: Every day | ORAL | 0 refills | Status: DC

## 2016-10-28 NOTE — Addendum Note (Signed)
Addended by: Margaretann LovelessBURNETTE, Darrion Macaulay M on: 10/28/2016 08:41 AM   Modules accepted: Orders

## 2016-10-28 NOTE — Telephone Encounter (Signed)
Prescription for clonazePAM (KLONOPIN) 0.5 MG tablet   [facility] [address]Spoke with Kenney Housemananya.  Thanks,  -Melissaann Dizdarevic

## 2016-10-28 NOTE — Telephone Encounter (Signed)
Please phone in clonazepam 0.5mg  Take one tab PO q hs prn sleep #30 NR

## 2016-11-13 ENCOUNTER — Ambulatory Visit (INDEPENDENT_AMBULATORY_CARE_PROVIDER_SITE_OTHER): Payer: BLUE CROSS/BLUE SHIELD | Admitting: Pulmonary Disease

## 2016-11-13 ENCOUNTER — Encounter: Payer: Self-pay | Admitting: Pulmonary Disease

## 2016-11-13 VITALS — BP 116/70 | HR 100 | Ht 59.0 in | Wt 152.0 lb

## 2016-11-13 DIAGNOSIS — R0600 Dyspnea, unspecified: Secondary | ICD-10-CM | POA: Diagnosis not present

## 2016-11-13 DIAGNOSIS — R0689 Other abnormalities of breathing: Secondary | ICD-10-CM | POA: Diagnosis not present

## 2016-11-13 DIAGNOSIS — F17210 Nicotine dependence, cigarettes, uncomplicated: Secondary | ICD-10-CM

## 2016-11-13 NOTE — Progress Notes (Signed)
Veronica Lozano    [id number]    05-12-1965  Primary Care Physician:Burnette, Alessandra Bevels, PA-C  Referring Physician: Margaretann Loveless, PA-C [address] [place], [place] [address]  Chief complaint:  Pre op pulmonary clearence  HPI: 51 year old with history of chronic pancreatitis of unclear etiology, chronic abdominal pain, type 2 diabetes, hyperlipidemia. Recently noted to have a mass at the head of the pancreas and she is being evaluated at [facility] for resection of the pancreas and spleen. She has been referred to pulmonary for clearance. She's [facility]  She is an active smoker with about a 10-pack-year smoking history and iscutting down her smoking down to 3 cigarettes a day. She quit drinking 20 years ago She used to work at [employer] but is on [employer] now.  She denies any dyspnea, cough, sputum production, wheezing, fevers, chills.  Outpatient Encounter Prescriptions as of 11/13/2016  Medication Sig  . acyclovir (ZOVIRAX) 800 MG tablet Take 1 tablet (800 mg total) by mouth as needed.  Marland Kitchen atorvastatin (LIPITOR) 20 MG tablet Take 20 mg by mouth daily.  . clonazePAM (KLONOPIN) 0.5 MG tablet Take 1 tablet (0.5 mg total) by mouth at bedtime.  . cyclobenzaprine (FLEXERIL) 10 MG tablet Take 1 tablet (10 mg total) by mouth 3 (three) times daily as needed for muscle spasms.  Marland Kitchen gabapentin (NEURONTIN) 300 MG capsule Take 300 mg by mouth at bedtime.   . insulin aspart (NOVOLOG) 100 UNIT/ML injection Inject 19 Units into the skin 3 (three) times daily before meals. Sliding scale  . insulin glargine (LANTUS) 100 UNIT/ML injection Inject 0.35 mLs (35 Units total) into the skin at bedtime.  Marland Kitchen oxyCODONE-acetaminophen (ROXICET) 5-325 MG tablet Take 1 tablet by mouth every 4 (four) hours as needed for moderate pain or severe pain.  . Pancrelipase, Lip-Prot-Amyl, (CREON) 24000 UNITS CPEP Take 24,000 Units by mouth 3 (three) times daily with  meals. 2 tablets with meals and 1 tablet with snacks  . pioglitazone (ACTOS) 30 MG tablet Take 30 mg by mouth daily.   . traZODone (DESYREL) 50 MG tablet Take 1 tablet (50 mg total) by mouth at bedtime as needed for sleep.  Marland Kitchen trimethobenzamide (TIGAN) 300 MG capsule Take 1 capsule (300 mg total) by mouth 3 (three) times daily as needed for nausea/vomiting.   No facility-administered encounter medications on file as of 11/13/2016.     Allergies as of 11/13/2016 - Review Complete 09/22/2016  Allergen Reaction Noted  . Coconut oil Hives 10/10/2014  . Dapagliflozin  01/08/2016  . Empagliflozin  01/08/2016  . Glipizide Hives 10/17/2015  . Latex Hives 09/19/2014  . Metformin Hives 10/17/2015  . Pantoprazole  10/23/2015  . Morphine and related Itching and Rash 09/19/2014    Past Medical History:  Diagnosis Date  . Diabetes mellitus without complication (HCC)   . Heartburn   . HLD (hyperlipidemia)   . Hypertension   . Pancreatitis     Past Surgical History:  Procedure Laterality Date  . ABDOMINAL HYSTERECTOMY    . APPENDECTOMY    . CARDIAC CATHETERIZATION    . CARPAL TUNNEL RELEASE Bilateral 1998  . CHOLECYSTECTOMY    . FOOT SURGERY Bilateral    for neuropathy    Family History  Problem Relation Age of Onset  . Cancer Father        Lymphoma  . Kidney cancer Father   . Cancer Mother  lung  . Diabetes Brother   . Cancer Brother   . Breast cancer Sister   . Bladder Cancer Neg Hx   . Prostate cancer Neg Hx     Social History   Social History  . Marital status: Married    Spouse name: N/A  . Number of children: N/A  . Years of education: N/A   Occupational History  . Not on file.   Social History Main Topics  . Smoking status: Current Every Day Smoker    Packs/day: 0.50    Types: Cigarettes  . Smokeless tobacco: Never Used  . Alcohol use No  . Drug use: No  . Sexual activity: Yes   Other Topics Concern  . Not on file   Social History Narrative  .  No narrative on file    Review of systems: Review of Systems  Constitutional: Negative for fever and chills.  HENT: Negative.   Eyes: Negative for blurred vision.  Respiratory: as per HPI  Cardiovascular: Negative for chest pain and palpitations.  Gastrointestinal: Negative for vomiting, diarrhea, blood per rectum. Genitourinary: Negative for dysuria, urgency, frequency and hematuria.  Musculoskeletal: Negative for myalgias, back pain and joint pain.  Skin: Negative for itching and rash.  Neurological: Negative for dizziness, tremors, focal weakness, seizures and loss of consciousness.  Endo/Heme/Allergies: Negative for environmental allergies.  Psychiatric/Behavioral: Negative for depression, suicidal ideas and hallucinations.  All other systems reviewed and are negative.  Physical Exam: Blood pressure 116/70, pulse 100, height  (1.499 m), weight 152 lb (68.9 kg), SpO2 97 %. Gen:      No acute distress HEENT:  EOMI, sclera anicteric Neck:     No masses; no thyromegaly Lungs:    Clear to auscultation bilaterally; normal respiratory effort CV:         Regular rate and rhythm; no murmurs Abd:      + bowel sounds; soft, non-tender; no palpable masses, no distension Ext:    No edema; adequate peripheral perfusion Skin:      Warm and dry; no rash Neuro: alert and oriented x 3 Psych: normal mood and affect  Data Reviewed: Chest x-ray 08/23/16-no acute cardiopulmonary abnormality CT abdomen 09/01/16-lung images do not show any abnormalities I have reviewed all images personally  Spirometry 11/05/16 FVC 2.16 [78%], FEV1 1.57 [71%] , F/F 73 Minimal obstruction, small airway disease suggested by curvature of flow loop. Mild restriction possible  Assessment:  Pre op clearance for pancreatic surgery Mrs. Mcclard is an active smoker with minimal obstruction on her pulmonary function tests. There is likely mild restriction which may be from her body habitus. There is no evidence of  interstitial lung disease on her recent chest x-ray. She is asymptomatic in office today with a clear lung exam.  Given the fact that she is getting an prolonged abdominal surgery, she is at intermediate risk per ARISCAT risk index: 13.3% pulmonary complication rate.  However there are no pulmonary contraindications to surgery. She is cleared to undergo surgery.  Reccomend adequate pain control, use bronchodilator as needed, use incentive spirometry post op with early ambulation and PT to reduce perioperative pulmonary complication.  Active smoker She is an active smoker and is trying to quit on her own. She does not require any help quitting. Time spent counseling- 5 mins.   Plan/Recommendations: - Cleared for surgery - Work on smoking cessation  Return to clinic as needed.   Chilton Greathouse MD [facility] Pager [phone] 11/13/2016,  12:31 PM  CC: Margaretann Loveless, P*

## 2016-11-13 NOTE — Patient Instructions (Addendum)
I had reviewed the chart. You are at increased risk for perioperative pulmonary complications however there is no contraindications to proceeding with the surgery I will send my recommendations on pulmonary clearence to your surgery team Please continue to work on smoking cessation  Return to clinic as needed.

## 2016-11-17 ENCOUNTER — Encounter: Payer: Self-pay | Admitting: Physician Assistant

## 2016-11-17 ENCOUNTER — Other Ambulatory Visit: Payer: Self-pay | Admitting: Physician Assistant

## 2016-11-17 DIAGNOSIS — G894 Chronic pain syndrome: Secondary | ICD-10-CM

## 2016-11-17 DIAGNOSIS — Z79891 Long term (current) use of opiate analgesic: Secondary | ICD-10-CM

## 2016-11-18 MED ORDER — OXYCODONE-ACETAMINOPHEN 5-325 MG PO TABS: 1.0000 | ORAL_TABLET | ORAL | 0 refills | Status: DC | PRN

## 2016-11-23 ENCOUNTER — Encounter: Payer: Self-pay | Admitting: Physician Assistant

## 2016-11-23 DIAGNOSIS — F5101 Primary insomnia: Secondary | ICD-10-CM

## 2016-11-24 ENCOUNTER — Encounter: Payer: Self-pay | Admitting: Physician Assistant

## 2016-11-24 ENCOUNTER — Other Ambulatory Visit: Payer: Self-pay | Admitting: Physician Assistant

## 2016-11-24 DIAGNOSIS — F5101 Primary insomnia: Secondary | ICD-10-CM

## 2016-11-24 MED ORDER — RAMELTEON 8 MG PO TABS: 8.0000 mg | ORAL_TABLET | Freq: Every day | ORAL | 0 refills | Status: DC

## 2016-11-25 ENCOUNTER — Encounter: Payer: Self-pay | Admitting: Physician Assistant

## 2016-11-25 MED ORDER — CLONAZEPAM 0.5 MG PO TABS: 0.5000 mg | ORAL_TABLET | Freq: Every day | ORAL | 5 refills | Status: DC

## 2016-11-25 NOTE — Telephone Encounter (Deleted)
Can we call in zolpidem  to take one tab PO q hs prn sleep #30 1 [facility] [address]

## 2016-11-25 NOTE — Telephone Encounter (Signed)
Ok to phone in Clonazepam 0.5mg  1 tab PO q hs prn sleep #30 5 refills

## 2016-12-25 ENCOUNTER — Encounter: Payer: Self-pay | Admitting: Physician Assistant

## 2016-12-30 ENCOUNTER — Ambulatory Visit: Payer: BLUE CROSS/BLUE SHIELD | Admitting: Physician Assistant

## 2016-12-30 ENCOUNTER — Encounter: Payer: Self-pay | Admitting: Physician Assistant

## 2016-12-30 VITALS — BP 116/70 | HR 100 | Temp 98.4°F | Resp 20 | Ht 59.0 in | Wt 141.0 lb

## 2016-12-30 DIAGNOSIS — E1142 Type 2 diabetes mellitus with diabetic polyneuropathy: Secondary | ICD-10-CM | POA: Diagnosis not present

## 2016-12-30 DIAGNOSIS — K860 Alcohol-induced chronic pancreatitis: Secondary | ICD-10-CM | POA: Diagnosis not present

## 2016-12-30 DIAGNOSIS — Z794 Long term (current) use of insulin: Secondary | ICD-10-CM | POA: Diagnosis not present

## 2016-12-30 NOTE — Patient Instructions (Signed)
[facility] Procedure, Care After  Refer to this sheet in the next few weeks. These instructions provide you with information about caring for yourself after your procedure. Your health care provider may also give you more specific instructions. Your treatment has been planned according to current medical practices, but problems sometimes occur. Call your health care provider if you have any problems or questions after your procedure.  What can I expect after the procedure?  After the procedure, it is common to have:  · Abdominal pain.  · Nausea and difficulty digesting food.  · A small amount of blood or clear fluid draining from your incision.  · Decreased appetite.     Follow these instructions at home:  Medicines  · Take over-the-counter and prescription medicines only as told by your health care provider.  · Do not drive or operate heavy machinery while taking prescription pain medicine.  · If you were prescribed an antibiotic medicine, take it as told by your health care provider. Do not stop taking the antibiotic even if you start to feel better.  Incision and drainage tube care       · Keep your incision area and drainage tube opening clean and dry.  · Check your incision area every day for signs of infection. Check for:  ? More redness, swelling, or pain.  ? More fluid or blood.  ? Warmth.  ? Pus or a bad smell.  · Follow instructions from your health care provider about how to take care of your incision. Make sure you:  ? Wash your hands with soap and water before you change your bandage (dressing). If soap and water are not available, use hand sanitizer.  ? Change your dressing as told by your health care provider.  ? Leave stitches (sutures), skin glue, or adhesive strips in place. These skin closures may need to be in place for 2 weeks or longer. If adhesive strip edges start to loosen and curl up, you may trim the loose edges. Do not remove adhesive strips completely unless your health care provider tells  you to do that.  · If you have a drainage tube, follow instructions from your health care provider about how to care for it.  ? Do not remove the tube yourself.  ? Change the dressing around the tube as told by your health care provider.  ? Write down how much fluid drains each day. Note any changes in how the fluid looks or smells.  Activity  · Return to your normal activities as told by your health care provider. Ask your health care provider what activities are safe for you.  · Walk around at least once every day to reduce the chance of blood clots.  · Do not engage in intense physical activity, such as running or jumping, until your health care provider says that you can.  · Do not lift anything that is heavier than 10 lb (4.5 kg) for 6 weeks or until your health care provider says that you can do this.  Eating and drinking  · Follow instructions from your health care provider about eating or drinking restrictions.  · Eat small meals at least 3 times a day. It is best to eat small portions every 2-3 hours. Do not skip meals, even if you do not feel like eating.  · Avoid foods that contain a lot of fat, such as greasy or fried foods. These can be difficult to digest.  · Eat healthy foods, such as:  ?   Fruits and vegetables.  ? Lean proteins, such as tuna, egg whites, chicken, or lean beef.  ? Low-fat dairy products.  ? High-fiber foods, such as beans, nuts, or whole grains.  · Drink enough fluid to keep your urine clear or pale yellow.  · Do not use alcohol until your health care provider approves.  General instructions  · Weigh yourself once a week and write down your weight.  · Wear compression stockings as told by your health care provider. These stockings help prevent blood clots and reduce swelling in your legs.  · Do not take baths, swim, or use a hot tub until your health care provider approves.  · Do not use any tobacco products, such as cigarettes, chewing tobacco, and e-cigarettes. If you need help  quitting, ask your health care provider.  · Keep all follow-up visits as told by your health care provider. This is important.  Contact a health care provider if:  · You are dizzy.  · You become constipated. Signs of constipation may include having:  ? Fewer than three bowel movements in a week.  ? Difficulty having a bowel movement.  ? Stools that are dry, hard, or larger than normal.  · You are unusually thirsty or unusually tired, and these feelings do not go away.  · You urinate more often than usual.  · You have nausea that does not go away.  · You have pain that gets worse or does not get better with medicine.  · You have diarrhea.  · You have more redness, swelling, or pain around your incision.  · You have more fluid or blood coming from your incision or your drainage tube.  · Your incision feels warm to the touch.  · You have pus or a bad smell coming from your incision.  · You have a fever.  · You have fluid coming from your drainage tube that changes color or smells bad.  Get help right away if:  · You are unable to eat.  · You vomit.  · You have severe pain.  · You have severe constipation or diarrhea.  · Your incision starts to open.  · You have chest pain.  · You have difficulty breathing.  This information is not intended to replace advice given to you by your health care provider. Make sure you discuss any questions you have with your health care provider.  Document Released: 09/23/2010 Document Revised: 02/27/2015 Document Reviewed: 10/24/2014  [employer]vier Interactive Patient Education © 2018 [employer]   

## 2016-12-30 NOTE — Progress Notes (Signed)
Patient: Veronica Lozano Female    DOB: 1965-05-24   51 y.o.   MRN: [id number] Visit Date: 12/30/2016  Today's Provider: Mar Daring, PA-C   Chief Complaint  Patient presents with  . Hospitalization Follow-up   Subjective:    HPI  Follow up Hospitalization  Patient was admitted to [facility] on 12/10/2016 and discharged on 12/21/2016. She was treated for pancreatittis. Treatment for this included surgery, whipple procedure for removal of area of chronic pancreatitis.  She reports excellent compliance with treatment. She reports this condition is Improved.  She states she is feeling great. She does still have a drain in place but there is no fluid in the bag today. She reports that she has been going 2-3 days per week back to the surgeon to have the drain slowly pulled. She reports she has 6 inches remaining. She does return tomorrow to have it pulled more.   She also reports her sugars have been stable. They are questioning if her pancreas has started to produce insulin again since the diseased portion has been removed. She reports her morning fasting sugars have been running around 134 daily.   She is also having normal BM. No constipation or diarrhea. No urinary symptoms.   Incision is also healing well. There is one area that has not completely healed at the proximal incision that she continues to keep covered with a dry dressing. No signs of infection.  ------------------------------------------------------------------------------------    Allergies  Allergen Reactions  . Latex Hives and Swelling  . Coconut Oil Hives  . Dapagliflozin     Other reaction(s): Other (See Comments) Yeast infection  . Empagliflozin     Other reaction(s): Other (See Comments) Yeast infection  . Glipizide Hives  . Metformin Hives  . Pantoprazole     Other reaction(s): Vomiting  . Morphine And Related Itching and Rash     Current Outpatient Medications:  .  acetaminophen  (TYLENOL) 325 MG tablet, Take 650 mg by mouth., Disp: , Rfl:  .  acyclovir (ZOVIRAX) 800 MG tablet, Take 1 tablet (800 mg total) by mouth as needed., Disp: 90 tablet, Rfl: 3 .  Blood Glucose Monitoring Suppl (CONTOUR NEXT MONITOR) w/Device KIT, use to TEST BLOOD SUGAR, Disp: , Rfl: 0 .  Blood Glucose Monitoring Suppl (GLUCOCOM BLOOD GLUCOSE MONITOR) DEVI, Contour Next Link Blood Glucose Meter, Disp: , Rfl:  .  ciprofloxacin (CIPRO) 500 MG tablet, Take 500 mg by mouth., Disp: , Rfl:  .  clonazePAM (KLONOPIN) 0.5 MG tablet, Take 1 tablet (0.5 mg total) by mouth at bedtime., Disp: 30 tablet, Rfl: 5 .  famotidine (PEPCID) 20 MG tablet, Take 20 mg by mouth., Disp: , Rfl:  .  folic acid (FOLVITE) 1 MG tablet, Take 1 mg by mouth., Disp: , Rfl:  .  insulin aspart (NOVOLOG) 100 UNIT/ML injection, Inject 2 Units 3 (three) times daily before meals into the skin. Sliding scale, Disp: , Rfl:  .  insulin glargine (LANTUS) 100 UNIT/ML injection, Inject 0.35 mLs (35 Units total) into the skin at bedtime. (Patient taking differently: Inject 10 Units at bedtime into the skin. ), Disp: 10 mL, Rfl: 11 .  metoCLOPramide (REGLAN) 5 MG tablet, Take 5 mg by mouth., Disp: , Rfl:  .  oxyCODONE (OXY IR/ROXICODONE) 5 MG immediate release tablet, Take 5 mg by mouth., Disp: , Rfl:  .  Pancrelipase, Lip-Prot-Amyl, (CREON) 24000 UNITS CPEP, Take 24,000 Units by mouth 3 (three) times daily with meals.  2 tablets with meals and 1 tablet with snacks, Disp: , Rfl:  .  polyethylene glycol (MIRALAX / GLYCOLAX) packet, Take by mouth., Disp: , Rfl:  .  thiamine 100 MG tablet, Take 100 mg by mouth., Disp: , Rfl:  .  trimethobenzamide (TIGAN) 300 MG capsule, Take 1 capsule (300 mg total) by mouth 3 (three) times daily as needed for nausea/vomiting., Disp: 90 capsule, Rfl: 0  Review of Systems  Constitutional: Negative.   HENT: Negative.   Respiratory: Negative.   Cardiovascular: Negative.   Gastrointestinal: Positive for abdominal pain  (incisional pain, different than previous pain). Negative for abdominal distention, blood in stool, constipation, diarrhea, nausea and vomiting.  Genitourinary: Negative.   Musculoskeletal: Negative.   Neurological: Negative.     Social History   Tobacco Use  . Smoking status: Current Every Day Smoker    Packs/day: 0.25    Years: 25.00    Pack years: 6.25    Types: Cigarettes  . Smokeless tobacco: Never Used  Substance Use Topics  . Alcohol use: No    Alcohol/week: 0.0 oz   Objective:   BP 116/70 (BP Location: Right Arm, Patient Position: Sitting, Cuff Size: Normal)   Pulse 100   Temp 98.4 F (36.9 C) (Oral)   Resp 20   Ht 4' 11"  (1.499 m)   Wt 141 lb (64 kg)   BMI 28.48 kg/m  Vitals:   12/30/16 0819  BP: 116/70  Pulse: 100  Resp: 20  Temp: 98.4 F (36.9 C)  TempSrc: Oral  Weight: 141 lb (64 kg)  Height: 4' 11"  (1.499 m)     Physical Exam  Constitutional: She appears well-developed and well-nourished. No distress.  Neck: Normal range of motion. Neck supple.  Cardiovascular: Normal rate, regular rhythm and normal heart sounds. Exam reveals no gallop and no friction rub.  No murmur heard. Pulmonary/Chest: Effort normal and breath sounds normal. No respiratory distress. She has no wheezes. She has no rales.  Abdominal: Soft. Bowel sounds are normal. She exhibits no distension. There is no tenderness.    Skin: She is not diaphoretic.  Vitals reviewed.       Assessment & Plan:     1. Alcohol-induced chronic pancreatitis ([place]) S/P Whipple procedure on 12/10/16. Patient is doing very well post operatively. Still followed by surgeon at [facility].   2. Type 2 diabetes mellitus with diabetic polyneuropathy, with long-term current use of insulin (HCC) Stable. Fasting sugars much improved.        Mar Daring, PA-C  [facility]

## 2017-01-01 ENCOUNTER — Encounter: Payer: Self-pay | Admitting: Physician Assistant

## 2017-01-01 DIAGNOSIS — K860 Alcohol-induced chronic pancreatitis: Secondary | ICD-10-CM

## 2017-01-02 MED ORDER — OXYCODONE-ACETAMINOPHEN 5-325 MG PO TABS: 1.0000 | ORAL_TABLET | ORAL | 0 refills | Status: DC | PRN

## 2017-01-08 ENCOUNTER — Encounter: Payer: Self-pay | Admitting: Physician Assistant

## 2017-01-08 DIAGNOSIS — K5903 Drug induced constipation: Secondary | ICD-10-CM

## 2017-01-09 ENCOUNTER — Encounter: Payer: Self-pay | Admitting: Physician Assistant

## 2017-01-09 MED ORDER — BISACODYL 10 MG RE SUPP: 10.0000 mg | RECTAL | 0 refills | Status: DC | PRN

## 2017-01-27 ENCOUNTER — Encounter: Payer: Self-pay | Admitting: Physician Assistant

## 2017-01-27 DIAGNOSIS — K860 Alcohol-induced chronic pancreatitis: Secondary | ICD-10-CM

## 2017-01-27 MED ORDER — OXYCODONE-ACETAMINOPHEN 5-325 MG PO TABS: 1.0000 | ORAL_TABLET | ORAL | 0 refills | Status: DC | PRN

## 2017-03-23 ENCOUNTER — Ambulatory Visit: Payer: BLUE CROSS/BLUE SHIELD | Admitting: Physician Assistant

## 2017-03-28 ENCOUNTER — Other Ambulatory Visit: Payer: Self-pay | Admitting: Physician Assistant

## 2017-06-01 NOTE — Progress Notes (Signed)
06/02/2017 9:04 AM   Veronica Lozano 12-26-1965 [id number]  Referring provider: Mar Daring, PA-C [address] [place] [place], [place] [address]  No chief complaint on file.   HPI: 52 yo WF with a history of hematuria, frequency and nocturia who presents today for a one year follow up.      History of hematuria Patient completed a hematuria workup in 04/2015.  No worrisome findings were discovered.  She has denied any gross hematuria.   Her UA today is negative for AMH.      Nocturia Patient is experiencing nocturia x 0-3 (stable).  She has restricted her fluids prior to bedtime and this helped with nocturia.  Her PVR was 0 mL.    Frequency She is experiencing urgency x 4-7 (worsening), frequency x 4-7 (worsening), she does not limit fluids (she cut down on her tea consumption), she is not engages in toilet mapping.  She has incontinence x 0-3 (stable).  PVR is 0 mL.  She did not take the Myrbetriq as her insurance would not cover the medication and her husband was concerned about the side effects.     PMH: Past Medical History:  Diagnosis Date  . Diabetes mellitus without complication ([place])   . Heartburn   . HLD (hyperlipidemia)   . Hypertension   . Pancreatitis     Surgical History: Past Surgical History:  Procedure Laterality Date  . ABDOMINAL HYSTERECTOMY    . APPENDECTOMY    . CARDIAC CATHETERIZATION    . CARPAL TUNNEL RELEASE Bilateral 1998  . CHOLECYSTECTOMY    . FOOT SURGERY Bilateral    for neuropathy    Home Medications:  Allergies as of 06/02/2017      Reactions   Latex Hives, Swelling   Coconut Oil Hives   Dapagliflozin    Other reaction(s): Other (See Comments) Yeast infection   Empagliflozin    Other reaction(s): Other (See Comments) Yeast infection   Glipizide Hives   Metformin Hives   Pantoprazole    Other reaction(s): Vomiting   Morphine And Related Itching, Rash      Medication List        Accurate as of 06/02/17   9:04 AM. Always use your most recent med list.          acetaminophen 325 MG tablet Commonly known as:  TYLENOL Take 650 mg by mouth.   acyclovir 800 MG tablet Commonly known as:  ZOVIRAX take 1 tablet by mouth once daily if needed   bisacodyl 10 MG suppository Commonly known as:  DULCOLAX Place 1 suppository (10 mg total) as needed rectally for moderate constipation.   clonazePAM 0.5 MG tablet Commonly known as:  KLONOPIN Take 1 tablet (0.5 mg total) by mouth at bedtime.   CREON 24000-76000 units Cpep Generic drug:  Pancrelipase (Lip-Prot-Amyl) Take 24,000 Units by mouth 3 (three) times daily with meals. 2 tablets with meals and 1 tablet with snacks   famotidine 20 MG tablet Commonly known as:  PEPCID Take 20 mg by mouth.   folic acid 1 MG tablet Commonly known as:  FOLVITE Take 1 mg by mouth.   GLUCOCOM BLOOD GLUCOSE MONITOR Devi Contour Next Link Blood Glucose Meter   CONTOUR NEXT MONITOR w/Device Kit use to TEST BLOOD SUGAR   insulin aspart 100 UNIT/ML injection Commonly known as:  novoLOG Inject 2 Units 3 (three) times daily before meals into the skin. Sliding scale   insulin glargine 100 UNIT/ML injection Commonly known as:  LANTUS Inject 0.35 mLs (35 Units total) into the skin at bedtime.   oxyCODONE-acetaminophen 5-325 MG tablet Commonly known as:  PERCOCET/ROXICET Take 1 tablet by mouth every 4 (four) hours as needed for severe pain.   thiamine 100 MG tablet Take 100 mg by mouth.   trimethobenzamide 300 MG capsule Commonly known as:  TIGAN Take 1 capsule (300 mg total) by mouth 3 (three) times daily as needed for nausea/vomiting.       Allergies:  Allergies  Allergen Reactions  . Latex Hives and Swelling  . Coconut Oil Hives  . Dapagliflozin     Other reaction(s): Other (See Comments) Yeast infection  . Empagliflozin     Other reaction(s): Other (See Comments) Yeast infection  . Glipizide Hives  . Metformin Hives  . Pantoprazole      Other reaction(s): Vomiting  . Morphine And Related Itching and Rash    Family History: Family History  Problem Relation Age of Onset  . Cancer Father        Lymphoma  . Kidney cancer Father   . Cancer Mother        lung  . Diabetes Brother   . Cancer Brother   . Breast cancer Sister   . Bladder Cancer Neg Hx   . Prostate cancer Neg Hx     Social History:  reports that she has been smoking cigarettes.  She has a 6.25 pack-year smoking history. She has never used smokeless tobacco. She reports that she does not drink alcohol or use drugs.  ROS: UROLOGY Frequent Urination?: No Hard to postpone urination?: No Burning/pain with urination?: No Get up at night to urinate?: No Leakage of urine?: No Urine stream starts and stops?: No Trouble starting stream?: No Do you have to strain to urinate?: No Blood in urine?: No Urinary tract infection?: No Sexually transmitted disease?: No Injury to kidneys or bladder?: No Painful intercourse?: No Weak stream?: No Currently pregnant?: No Vaginal bleeding?: No Last menstrual period?: n  Gastrointestinal Nausea?: No Vomiting?: No Indigestion/heartburn?: No Diarrhea?: No Constipation?: No  Constitutional Fever: No Night sweats?: No Weight loss?: No Fatigue?: No  Skin Skin rash/lesions?: No Itching?: No  Eyes Blurred vision?: No Double vision?: No  Ears/Nose/Throat Sore throat?: No Sinus problems?: No  Hematologic/Lymphatic Swollen glands?: No Easy bruising?: No  Cardiovascular Leg swelling?: No Chest pain?: No  Respiratory Cough?: No Shortness of breath?: No  Endocrine Excessive thirst?: No  Musculoskeletal Back pain?: No Joint pain?: No  Neurological Headaches?: No Dizziness?: No  Psychologic Depression?: No Anxiety?: No  Physical Exam: BP 125/83   Pulse (!) 106   Resp 16   Ht 4' 9.5" (1.461 m)   Wt 116 lb 6.4 oz (52.8 kg)   SpO2 98%   BMI 24.75 kg/m   Constitutional: Well nourished.  Alert and oriented, No acute distress. HEENT: [place] AT, moist mucus membranes. Trachea midline, no masses. Cardiovascular: No clubbing, cyanosis, or edema. Respiratory: Normal respiratory effort, no increased work of breathing. GI: Abdomen is soft, non tender, non distended, no abdominal masses. Liver and spleen not palpable.  No hernias appreciated.  Stool sample for occult testing is not indicated.  Midline scar well healed.   GU: No CVA tenderness.  No bladder fullness or masses.   Skin: No rashes, bruises or suspicious lesions. Lymph: No cervical or inguinal adenopathy. Neurologic: Grossly intact, no focal deficits, moving all 4 extremities. Psychiatric: Normal mood and affect.   Laboratory Data: Lab Results  Component Value Date  WBC 9.7 08/23/2016   HGB 17.0 (H) 08/23/2016   HCT 49.3 (H) 08/23/2016   MCV 88.3 08/23/2016   PLT 266 08/23/2016    Lab Results  Component Value Date   CREATININE 0.57 08/23/2016    Lab Results  Component Value Date   HGBA1C 7.0 04/04/2016    Lab Results  Component Value Date   AST 20 08/23/2016   Lab Results  Component Value Date   ALT 16 08/23/2016     Pertinent Imaging: Results for AISHA, GREENBERGER (MRN [id number]) as of 06/02/2017 09:02  Ref. Range 06/02/2017 08:48  Scan Result Unknown 0     Assessment & Plan:    1. History of hematuria   - hematuria work up completed in 04/2015 - no worrisome findings  - UA on 06/02/2017 was negative for microscopic hematuria  - she denies any gross hematuria  - Continue yearly UA's    2. Frequency  - offered behavioral therapies, bladder training, bladder control strategies and pelvic floor muscle training -patient deferred  - fluid management - drinking adequate and she is on Jardiance  - RTC in 12 months for PVR and an OAB questionnaire  3. Nocturia   - Patient has seen improvement with decreasing fluids before bedtime   Return in about 1 year (around 06/03/2018) for PVR and OAB  questionnaire.  These notes generated with voice recognition software. I apologize for typographical errors.  Zara Council, [facility] [address] [place], [place] [address] [phone]

## 2017-06-02 ENCOUNTER — Ambulatory Visit: Payer: BLUE CROSS/BLUE SHIELD | Admitting: Urology

## 2017-06-02 ENCOUNTER — Encounter: Payer: Self-pay | Admitting: Urology

## 2017-06-02 VITALS — BP 125/83 | HR 106 | Resp 16 | Ht <= 58 in | Wt 116.4 lb

## 2017-06-02 DIAGNOSIS — R351 Nocturia: Secondary | ICD-10-CM

## 2017-06-02 DIAGNOSIS — Z87448 Personal history of other diseases of urinary system: Secondary | ICD-10-CM

## 2017-06-02 DIAGNOSIS — R350 Frequency of micturition: Secondary | ICD-10-CM | POA: Diagnosis not present

## 2017-06-02 LAB — URINALYSIS, COMPLETE
Bilirubin, UA: NEGATIVE
Ketones, UA: NEGATIVE
Leukocytes, UA: NEGATIVE
Nitrite, UA: NEGATIVE
Protein, UA: NEGATIVE
Specific Gravity, UA: 1.02 (ref 1.005–1.030)
Urobilinogen, Ur: 0.2 mg/dL (ref 0.2–1.0)
pH, UA: 5.5 (ref 5.0–7.5)

## 2017-06-02 LAB — MICROSCOPIC EXAMINATION
BACTERIA UA: NONE SEEN
WBC UA: NONE SEEN /HPF (ref 0–5)

## 2017-06-02 LAB — BLADDER SCAN AMB NON-IMAGING: Scan Result: 0

## 2017-06-05 ENCOUNTER — Ambulatory Visit: Payer: BLUE CROSS/BLUE SHIELD | Admitting: Physician Assistant

## 2017-06-05 ENCOUNTER — Encounter: Payer: Self-pay | Admitting: Physician Assistant

## 2017-06-05 VITALS — BP 102/70 | HR 92 | Temp 98.2°F | Resp 16 | Wt 117.2 lb

## 2017-06-05 DIAGNOSIS — R1012 Left upper quadrant pain: Secondary | ICD-10-CM | POA: Diagnosis not present

## 2017-06-05 DIAGNOSIS — F5101 Primary insomnia: Secondary | ICD-10-CM

## 2017-06-05 MED ORDER — CLONAZEPAM 0.5 MG PO TABS: 0.5000 mg | ORAL_TABLET | Freq: Every day | ORAL | 5 refills | Status: DC

## 2017-06-05 NOTE — Progress Notes (Signed)
Patient: Veronica Lozano Female    DOB: 1966/02/17   52 y.o.   MRN: [id number] Visit Date: 06/05/2017  Today's Provider: Mar Daring, PA-C   No chief complaint on file.  Subjective:    HPI Patient here today with c/o Left side abdominal pain going towards her back. She has history of chronic pancreatitis and reports this feels similar to previous attacks (early onset). She has underwent a whipple procedure on 12/09/2016 and has not had any recurrence of pancreatic attacks until this today. She has notified her ambassador at [facility] and was advised to come here to make sure it is not something else.      Allergies  Allergen Reactions  . Latex Hives and Swelling  . Coconut Oil Hives  . Dapagliflozin     Other reaction(s): Other (See Comments) Yeast infection  . Empagliflozin     Other reaction(s): Other (See Comments) Yeast infection  . Glipizide Hives  . Metformin Hives  . Pantoprazole     Other reaction(s): Vomiting  . Morphine And Related Itching and Rash     Current Outpatient Medications:  .  acyclovir (ZOVIRAX) 800 MG tablet, take 1 tablet by mouth once daily if needed, Disp: 90 tablet, Rfl: 3 .  Blood Glucose Monitoring Suppl (CONTOUR NEXT MONITOR) w/Device KIT, use to TEST BLOOD SUGAR, Disp: , Rfl: 0 .  Blood Glucose Monitoring Suppl (GLUCOCOM BLOOD GLUCOSE MONITOR) DEVI, Contour Next Link Blood Glucose Meter, Disp: , Rfl:  .  clonazePAM (KLONOPIN) 0.5 MG tablet, Take 1 tablet (0.5 mg total) by mouth at bedtime., Disp: 30 tablet, Rfl: 5 .  empagliflozin (JARDIANCE) 25 MG TABS tablet, Take by mouth., Disp: , Rfl:  .  famotidine (PEPCID) 20 MG tablet, Take 20 mg by mouth., Disp: , Rfl:  .  folic acid (FOLVITE) 1 MG tablet, Take 1 mg by mouth., Disp: , Rfl:  .  insulin aspart (NOVOLOG) 100 UNIT/ML injection, Inject 2 Units 3 (three) times daily before meals into the skin. Sliding scale, Disp: , Rfl:  .  insulin glargine (LANTUS) 100 UNIT/ML  injection, Inject 0.35 mLs (35 Units total) into the skin at bedtime. (Patient taking differently: Inject 10 Units at bedtime into the skin. ), Disp: 10 mL, Rfl: 11 .  Pancrelipase, Lip-Prot-Amyl, (CREON) 24000 UNITS CPEP, Take 24,000 Units by mouth 3 (three) times daily with meals. 2 tablets with meals and 1 tablet with snacks, Disp: , Rfl:  .  acetaminophen (TYLENOL) 325 MG tablet, Take 650 mg by mouth., Disp: , Rfl:   Review of Systems  Constitutional: Negative for fatigue and fever.  Cardiovascular: Positive for chest pain ("whenever she gets the abdominal pain"). Negative for palpitations and leg swelling.  Gastrointestinal: Positive for abdominal distention, abdominal pain and diarrhea (chronic since partial pancreactomy). Negative for anal bleeding, blood in stool, constipation, nausea and vomiting.  Neurological: Negative for dizziness and headaches.    Social History   Tobacco Use  . Smoking status: Current Every Day Smoker    Packs/day: 0.25    Years: 25.00    Pack years: 6.25    Types: Cigarettes  . Smokeless tobacco: Never Used  Substance Use Topics  . Alcohol use: No    Alcohol/week: 0.0 oz   Objective:   BP 102/70 (BP Location: Left Arm, Patient Position: Sitting, Cuff Size: Normal)   Pulse 92   Temp 98.2 F (36.8 C) (Oral)   Resp 16   Wt 117 lb  3.2 oz (53.2 kg)   BMI 24.92 kg/m    Physical Exam  Constitutional: She is oriented to person, place, and time. She appears well-developed and well-nourished. No distress.  Cardiovascular: Normal rate, regular rhythm and normal heart sounds. Exam reveals no gallop and no friction rub.  No murmur heard. Pulmonary/Chest: Effort normal and breath sounds normal. No respiratory distress. She has no wheezes. She has no rales.  Abdominal: Soft. Normal appearance and bowel sounds are normal. She exhibits no distension and no mass. There is no hepatosplenomegaly. There is tenderness in the left upper quadrant. There is no rebound,  no guarding and no CVA tenderness. No hernia.    Neurological: She is alert and oriented to person, place, and time.  Skin: Skin is warm and dry. She is not diaphoretic.        Assessment & Plan:     1. Primary insomnia Stable. Diagnosis pulled for medication refill. Continue current medical treatment plan. - clonazePAM (KLONOPIN) 0.5 MG tablet; Take 1 tablet (0.5 mg total) by mouth at bedtime.  Dispense: 30 tablet; Refill: 5  2. LUQ pain Suspect gastritis vs pancreatitis. Will check labs as below. I will f/u pending labs. Also patient was given 15 day sample of Dexilant to see if this may help symptoms (would help if gastritis). Continue all other prescribed medications at this time as well. She is to call if symptoms worsen while awaiting results.  - CBC w/Diff/Platelet - Comprehensive Metabolic Panel (CMET) - Lipase       Mar Daring, PA-C  [facility]

## 2017-06-06 LAB — CBC WITH DIFFERENTIAL/PLATELET
BASOS ABS: 0 10*3/uL (ref 0.0–0.2)
Basos: 0 %
EOS (ABSOLUTE): 0.1 10*3/uL (ref 0.0–0.4)
Eos: 1 %
HEMOGLOBIN: 15.7 g/dL (ref 11.1–15.9)
Hematocrit: 44.1 % (ref 34.0–46.6)
IMMATURE GRANS (ABS): 0 10*3/uL (ref 0.0–0.1)
Immature Granulocytes: 0 %
LYMPHS: 37 %
Lymphocytes Absolute: 3.1 10*3/uL (ref 0.7–3.1)
MCH: 31.6 pg (ref 26.6–33.0)
MCHC: 35.6 g/dL (ref 31.5–35.7)
MCV: 89 fL (ref 79–97)
MONOCYTES: 4 %
Monocytes Absolute: 0.4 10*3/uL (ref 0.1–0.9)
NEUTROS ABS: 4.8 10*3/uL (ref 1.4–7.0)
Neutrophils: 58 %
Platelets: 245 10*3/uL (ref 150–379)
RBC: 4.97 x10E6/uL (ref 3.77–5.28)
RDW: 13.1 % (ref 12.3–15.4)
WBC: 8.5 10*3/uL (ref 3.4–10.8)

## 2017-06-06 LAB — COMPREHENSIVE METABOLIC PANEL
ALBUMIN: 4.3 g/dL (ref 3.5–5.5)
ALT: 12 IU/L (ref 0–32)
AST: 13 IU/L (ref 0–40)
Albumin/Globulin Ratio: 1.8 (ref 1.2–2.2)
Alkaline Phosphatase: 85 IU/L (ref 39–117)
BUN / CREAT RATIO: 25 — AB (ref 9–23)
BUN: 12 mg/dL (ref 6–24)
Bilirubin Total: 0.5 mg/dL (ref 0.0–1.2)
CO2: 23 mmol/L (ref 20–29)
Calcium: 9.3 mg/dL (ref 8.7–10.2)
Chloride: 101 mmol/L (ref 96–106)
Creatinine, Ser: 0.48 mg/dL — ABNORMAL LOW (ref 0.57–1.00)
GFR, EST AFRICAN AMERICAN: 132 mL/min/{1.73_m2} (ref >59)
GFR, EST NON AFRICAN AMERICAN: 115 mL/min/{1.73_m2} (ref >59)
GLUCOSE: 117 mg/dL — AB (ref 65–99)
Globulin, Total: 2.4 g/dL (ref 1.5–4.5)
Potassium: 4.5 mmol/L (ref 3.5–5.2)
Sodium: 139 mmol/L (ref 134–144)
TOTAL PROTEIN: 6.7 g/dL (ref 6.0–8.5)

## 2017-06-06 LAB — LIPASE: Lipase: 7 U/L — ABNORMAL LOW (ref 14–72)

## 2017-06-08 ENCOUNTER — Telehealth: Payer: Self-pay

## 2017-06-08 ENCOUNTER — Encounter: Payer: Self-pay | Admitting: Physician Assistant

## 2017-06-08 NOTE — Telephone Encounter (Signed)
-----   Message from Margaretann LovelessJennifer M Burnette, PA-C sent at 06/08/2017  9:53 AM EDT ----- All labs are normal. Lipase normal. No liver enzyme elevation. No elevated WBC count showing signs of infection. How are you feeling?

## 2017-06-08 NOTE — Telephone Encounter (Signed)
Viewed by Beverly Milchoma J Pribyl on 06/08/2017 10:16 AM  Patient also replied back to the provider.

## 2017-07-02 ENCOUNTER — Encounter: Payer: Self-pay | Admitting: Physician Assistant

## 2017-07-02 ENCOUNTER — Ambulatory Visit
Admission: RE | Admit: 2017-07-02 | Discharge: 2017-07-02 | Disposition: A | Discharge status: Home / Self Care | Payer: BLUE CROSS/BLUE SHIELD | Source: Ambulatory Visit | Attending: Physician Assistant | Admitting: Physician Assistant

## 2017-07-02 ENCOUNTER — Ambulatory Visit: Payer: BLUE CROSS/BLUE SHIELD | Admitting: Physician Assistant

## 2017-07-02 VITALS — BP 110/80 | HR 92 | Temp 98.1°F | Resp 16 | Wt 117.0 lb

## 2017-07-02 DIAGNOSIS — M79644 Pain in right finger(s): Secondary | ICD-10-CM

## 2017-07-02 DIAGNOSIS — L608 Other nail disorders: Secondary | ICD-10-CM

## 2017-07-02 DIAGNOSIS — R234 Changes in skin texture: Secondary | ICD-10-CM | POA: Diagnosis not present

## 2017-07-02 MED ORDER — MOMETASONE FUROATE 0.1 % EX CREA: 1.0000 "application " | TOPICAL_CREAM | Freq: Every day | CUTANEOUS | 0 refills | Status: DC

## 2017-07-02 NOTE — Progress Notes (Signed)
Patient: Veronica Lozano Female    DOB: September 26, 1965   52 y.o.   MRN: [id number] Visit Date: 07/02/2017  Today's Provider: Mar Daring, PA-C   Chief Complaint  Patient presents with  . Hand Pain   Subjective:    HPI Patient here today C/O middle finger pain, due to dry, cracked skin. Patient reports she has been using antibacterial cream on finger reports no improvement.  Reports she hits it often at work and causes the skin to continue to reopen and she reports intense pain with it. She is worried the tip may be fractured or that there may be something in it. She reports she injured the finger months before and it has not healed since. She is a diabetic and also recently underwent a partial pancreactomy due to chronic pancreatitis.     Allergies  Allergen Reactions  . Latex Hives and Swelling  . Coconut Oil Hives  . Dapagliflozin     Other reaction(s): Other (See Comments) Yeast infection  . Empagliflozin     Other reaction(s): Other (See Comments) Yeast infection  . Glipizide Hives  . Metformin Hives  . Pantoprazole     Other reaction(s): Vomiting  . Morphine And Related Itching and Rash     Current Outpatient Medications:  .  acetaminophen (TYLENOL) 325 MG tablet, Take 650 mg by mouth., Disp: , Rfl:  .  acyclovir (ZOVIRAX) 800 MG tablet, take 1 tablet by mouth once daily if needed, Disp: 90 tablet, Rfl: 3 .  Blood Glucose Monitoring Suppl (CONTOUR NEXT MONITOR) w/Device KIT, use to TEST BLOOD SUGAR, Disp: , Rfl: 0 .  Blood Glucose Monitoring Suppl (GLUCOCOM BLOOD GLUCOSE MONITOR) DEVI, Contour Next Link Blood Glucose Meter, Disp: , Rfl:  .  clonazePAM (KLONOPIN) 0.5 MG tablet, Take 1 tablet (0.5 mg total) by mouth at bedtime., Disp: 30 tablet, Rfl: 5 .  empagliflozin (JARDIANCE) 25 MG TABS tablet, Take by mouth., Disp: , Rfl:  .  insulin aspart (NOVOLOG) 100 UNIT/ML injection, Inject 2 Units 3 (three) times daily before meals into the skin. Sliding scale,  Disp: , Rfl:  .  insulin glargine (LANTUS) 100 UNIT/ML injection, Inject 0.35 mLs (35 Units total) into the skin at bedtime. (Patient taking differently: Inject 10 Units at bedtime into the skin. ), Disp: 10 mL, Rfl: 11 .  Pancrelipase, Lip-Prot-Amyl, (CREON) 24000 UNITS CPEP, Take 24,000 Units by mouth 3 (three) times daily with meals. 2 tablets with meals and 1 tablet with snacks, Disp: , Rfl:  .  famotidine (PEPCID) 20 MG tablet, Take 20 mg by mouth., Disp: , Rfl:   Review of Systems  Constitutional: Negative.   Respiratory: Negative.   Cardiovascular: Negative.   Musculoskeletal: Positive for arthralgias.  Skin:       Dry cracked skin  Neurological: Negative for weakness and numbness.    Social History   Tobacco Use  . Smoking status: Current Every Day Smoker    Packs/day: 0.25    Years: 25.00    Pack years: 6.25    Types: Cigarettes  . Smokeless tobacco: Never Used  Substance Use Topics  . Alcohol use: No    Alcohol/week: 0.0 oz   Objective:   BP 110/80 (BP Location: Left Arm, Patient Position: Sitting, Cuff Size: Normal)   Pulse 92   Temp 98.1 F (36.7 C) (Oral)   Resp 16   Wt 117 lb (53.1 kg)   SpO2 97%   BMI 24.88 kg/m  Vitals:   07/02/17 1549  BP: 110/80  Pulse: 92  Resp: 16  Temp: 98.1 F (36.7 C)  TempSrc: Oral  SpO2: 97%  Weight: 117 lb (53.1 kg)     Physical Exam  Constitutional: She appears well-developed and well-nourished. No distress.  Neck: Normal range of motion. Neck supple.  Cardiovascular: Normal rate, regular rhythm and normal heart sounds. Exam reveals no gallop and no friction rub.  No murmur heard. Pulmonary/Chest: Effort normal and breath sounds normal. No respiratory distress. She has no wheezes. She has no rales.  Musculoskeletal:       Right hand: She exhibits tenderness. She exhibits normal capillary refill and no swelling. Normal sensation noted. Normal strength noted.       Hands: Skin: She is not diaphoretic.  Vitals  reviewed.       Assessment & Plan:     1. Pain of right middle finger Xray will be obtained for patient reassurance as below. Steroid cream given to hopefully heal the cracked skin. Discussed using a finger splint at work to protect the skin from further injury. Referral placed to dermatology for further evaluation if it does not heal. If it heals she may cancel referral.  - DG Hand Complete Right; Future - mometasone (ELOCON) 0.1 % cream; Apply 1 application topically daily.  Dispense: 45 g; Refill: 0 - Ambulatory referral to Dermatology  2. Cracked skin See above medical treatment plan. - mometasone (ELOCON) 0.1 % cream; Apply 1 application topically daily.  Dispense: 45 g; Refill: 0 - Ambulatory referral to Dermatology  3. Nail discoloration There is a small black spot just under nail that has been there for months. Thought it was from the initial injury but not improving. Sent to dermatology for consideration of biopsy to make sure not a melanoma under the skin.  - Ambulatory referral to Dermatology       Mar Daring, PA-C  [place]

## 2017-07-03 ENCOUNTER — Telehealth: Payer: Self-pay

## 2017-07-03 NOTE — Telephone Encounter (Signed)
-----   Message from Margaretann Loveless, [place] sent at 07/03/2017 11:23 AM EDT ----- No fracture of bony abnormality

## 2017-07-03 NOTE — Telephone Encounter (Signed)
Patient advised as below.  

## 2017-07-06 ENCOUNTER — Encounter: Payer: Self-pay | Admitting: Physician Assistant

## 2017-07-17 ENCOUNTER — Ambulatory Visit: Payer: BLUE CROSS/BLUE SHIELD | Admitting: Physician Assistant

## 2017-07-17 ENCOUNTER — Encounter: Payer: Self-pay | Admitting: Physician Assistant

## 2017-07-17 VITALS — BP 110/80 | HR 94 | Temp 98.0°F | Resp 16 | Wt 114.0 lb

## 2017-07-17 DIAGNOSIS — M76811 Anterior tibial syndrome, right leg: Secondary | ICD-10-CM | POA: Diagnosis not present

## 2017-07-17 LAB — HM DIABETES EYE EXAM

## 2017-07-17 NOTE — Patient Instructions (Signed)
Kinesiology tape (pink, long roll), and I-strips (black-stretchy) Look up tibialis anterior kinesiology taping. Black tape goes over area of pain.

## 2017-07-17 NOTE — Progress Notes (Signed)
Patient: Veronica Lozano Female    DOB: [birth year]   52 y.o.   MRN: [id number] Visit Date: 07/17/2017  Today's Provider: Mar Daring, PA-C   Chief Complaint  Patient presents with  . Leg Pain   Subjective:    HPI Patient here today C/O right leg pain x's 2 weeks. Patient reports pain is on the shin and pain is worse when foot is bent back and forth. Patient reports pain was worse last night. Patient reports Ibuprofen 200 mg two times a day only due to medication upsetting her stomach. Patient reports she is also elevating her foot during the night to help with pain. Patient denies any injuries.     Allergies  Allergen Reactions  . Latex Hives and Swelling  . Coconut Oil Hives  . Dapagliflozin     Other reaction(s): Other (See Comments) Yeast infection  . Empagliflozin     Other reaction(s): Other (See Comments) Yeast infection  . Glipizide Hives  . Metformin Hives  . Pantoprazole     Other reaction(s): Vomiting  . Morphine And Related Itching and Rash     Current Outpatient Medications:  .  acetaminophen (TYLENOL) 325 MG tablet, Take 650 mg by mouth., Disp: , Rfl:  .  acyclovir (ZOVIRAX) 800 MG tablet, take 1 tablet by mouth once daily if needed, Disp: 90 tablet, Rfl: 3 .  Blood Glucose Monitoring Suppl (CONTOUR NEXT MONITOR) w/Device KIT, use to TEST BLOOD SUGAR, Disp: , Rfl: 0 .  Blood Glucose Monitoring Suppl (GLUCOCOM BLOOD GLUCOSE MONITOR) DEVI, Contour Next Link Blood Glucose Meter, Disp: , Rfl:  .  clonazePAM (KLONOPIN) 0.5 MG tablet, Take 1 tablet (0.5 mg total) by mouth at bedtime., Disp: 30 tablet, Rfl: 5 .  empagliflozin (JARDIANCE) 25 MG TABS tablet, Take by mouth., Disp: , Rfl:  .  insulin aspart (NOVOLOG) 100 UNIT/ML injection, Inject 2 Units 3 (three) times daily before meals into the skin. Sliding scale, Disp: , Rfl:  .  insulin glargine (LANTUS) 100 UNIT/ML injection, Inject 0.35 mLs (35 Units total) into the skin at bedtime. (Patient taking  differently: Inject 10 Units at bedtime into the skin. ), Disp: 10 mL, Rfl: 11 .  mometasone (ELOCON) 0.1 % cream, Apply 1 application topically daily., Disp: 45 g, Rfl: 0 .  Pancrelipase, Lip-Prot-Amyl, (CREON) 24000 UNITS CPEP, Take 24,000 Units by mouth 3 (three) times daily with meals. 2 tablets with meals and 1 tablet with snacks, Disp: , Rfl:  .  famotidine (PEPCID) 20 MG tablet, Take 20 mg by mouth., Disp: , Rfl:   Review of Systems  Constitutional: Negative.   Respiratory: Negative.   Cardiovascular: Negative.   Musculoskeletal: Positive for arthralgias, gait problem and joint swelling.       Right leg pain  Neurological: Negative for weakness and numbness.    Social History   Tobacco Use  . Smoking status: Current Every Day Smoker    Packs/day: 0.25    Years: 25.00    Pack years: 6.25    Types: Cigarettes  . Smokeless tobacco: Never Used  Substance Use Topics  . Alcohol use: No    Alcohol/week: 0.0 oz   Objective:   BP 110/80 (BP Location: Left Arm, Patient Position: Sitting, Cuff Size: Normal)   Pulse 94   Temp 98 F (36.7 C) (Oral)   Resp 16   Wt 114 lb (51.7 kg)   SpO2 97%   BMI 24.24 kg/m  Vitals:  07/17/17 1046  BP: 110/80  Pulse: 94  Resp: 16  Temp: 98 F (36.7 C)  TempSrc: Oral  SpO2: 97%  Weight: 114 lb (51.7 kg)     Physical Exam  Constitutional: She appears well-developed and well-nourished. No distress.  Neck: Normal range of motion. Neck supple.  Cardiovascular: Normal rate, regular rhythm and normal heart sounds. Exam reveals no gallop and no friction rub.  No murmur heard. Pulmonary/Chest: Effort normal and breath sounds normal. No respiratory distress. She has no wheezes. She has no rales.  Musculoskeletal:       Right ankle: She exhibits normal range of motion, no swelling and normal pulse. Tenderness (over anterior tibial tendon). Achilles tendon normal.  Pain with AROM of dorsiflexion and plantar flexion  Skin: She is not  diaphoretic.  Vitals reviewed.       Assessment & Plan:     1. Anterior tibialis tendinitis of right leg Discussed bracing, also showed video and gave information about kinesiology taping methods. Increasing Ibuprofen to 629m TID x 7-10 days, no longer. Call if symptoms do not improve and will refer to podiatry.        JMar Daring PA-C  [facility]

## 2017-09-07 ENCOUNTER — Telehealth: Payer: Self-pay | Admitting: Physician Assistant

## 2017-09-08 ENCOUNTER — Ambulatory Visit: Payer: BLUE CROSS/BLUE SHIELD | Admitting: Physician Assistant

## 2017-10-08 ENCOUNTER — Ambulatory Visit: Payer: BLUE CROSS/BLUE SHIELD | Admitting: Physician Assistant

## 2017-10-08 ENCOUNTER — Encounter: Payer: Self-pay | Admitting: Physician Assistant

## 2017-10-08 VITALS — BP 110/72 | HR 93 | Temp 97.9°F | Resp 16 | Wt 116.8 lb

## 2017-10-08 DIAGNOSIS — Z1231 Encounter for screening mammogram for malignant neoplasm of breast: Secondary | ICD-10-CM | POA: Diagnosis not present

## 2017-10-08 DIAGNOSIS — R42 Dizziness and giddiness: Secondary | ICD-10-CM | POA: Diagnosis not present

## 2017-10-08 DIAGNOSIS — G479 Sleep disorder, unspecified: Secondary | ICD-10-CM | POA: Diagnosis not present

## 2017-10-08 DIAGNOSIS — Z1211 Encounter for screening for malignant neoplasm of colon: Secondary | ICD-10-CM

## 2017-10-08 DIAGNOSIS — Z1239 Encounter for other screening for malignant neoplasm of breast: Secondary | ICD-10-CM

## 2017-10-08 MED ORDER — CLONAZEPAM 1 MG PO TABS: 1.0000 mg | ORAL_TABLET | Freq: Two times a day (BID) | ORAL | 1 refills | Status: DC | PRN

## 2017-10-08 NOTE — Progress Notes (Signed)
Patient: Veronica Lozano Female    DOB: [birth year]   52 y.o.   MRN: [id number] Visit Date: 10/08/2017  Today's Provider: Mar Daring, PA-C   Chief Complaint  Patient presents with  . Dizziness   Subjective:    Dizziness  This is a new problem. The current episode started more than 1 month ago. The problem occurs rarely (Twice a month at any time). Associated symptoms include fatigue and headaches. Pertinent negatives include no chest pain, nausea, numbness, vertigo, visual change, vomiting or weakness. Nothing aggravates the symptoms. She has tried relaxation for the symptoms. The treatment provided no relief.   Patient is also requesting a referral for Colonoscopy. She reports she is due for one.    Allergies  Allergen Reactions  . Latex Hives and Swelling  . Coconut Oil Hives  . Dapagliflozin     Other reaction(s): Other (See Comments) Yeast infection  . Empagliflozin     Other reaction(s): Other (See Comments) Yeast infection  . Glipizide Hives  . Metformin Hives  . Pantoprazole     Other reaction(s): Vomiting  . Morphine And Related Itching and Rash     Current Outpatient Medications:  .  acyclovir (ZOVIRAX) 800 MG tablet, take 1 tablet by mouth once daily if needed, Disp: 90 tablet, Rfl: 3 .  Blood Glucose Monitoring Suppl (CONTOUR NEXT MONITOR) w/Device KIT, use to TEST BLOOD SUGAR, Disp: , Rfl: 0 .  Blood Glucose Monitoring Suppl (GLUCOCOM BLOOD GLUCOSE MONITOR) DEVI, Contour Next Link Blood Glucose Meter, Disp: , Rfl:  .  clonazePAM (KLONOPIN) 0.5 MG tablet, Take 1 tablet (0.5 mg total) by mouth at bedtime., Disp: 30 tablet, Rfl: 5 .  empagliflozin (JARDIANCE) 25 MG TABS tablet, Take by mouth., Disp: , Rfl:  .  insulin aspart (NOVOLOG) 100 UNIT/ML injection, Inject 2 Units 3 (three) times daily before meals into the skin. Sliding scale, Disp: , Rfl:  .  insulin glargine (LANTUS) 100 UNIT/ML injection, Inject 0.35 mLs (35 Units total) into the skin  at bedtime. (Patient taking differently: Inject 10 Units at bedtime into the skin. ), Disp: 10 mL, Rfl: 11 .  Pancrelipase, Lip-Prot-Amyl, (CREON) 24000 UNITS CPEP, Take 24,000 Units by mouth 3 (three) times daily with meals. 2 tablets with meals and 1 tablet with snacks, Disp: , Rfl:  .  acetaminophen (TYLENOL) 325 MG tablet, Take 650 mg by mouth., Disp: , Rfl:   Review of Systems  Constitutional: Positive for fatigue.  Respiratory: Negative.   Cardiovascular: Negative for chest pain, palpitations and leg swelling.  Gastrointestinal: Negative for nausea and vomiting.  Neurological: Positive for dizziness and headaches. Negative for vertigo, weakness and numbness.    Social History   Tobacco Use  . Smoking status: Current Every Day Smoker    Packs/day: 0.25    Years: 25.00    Pack years: 6.25    Types: Cigarettes  . Smokeless tobacco: Never Used  Substance Use Topics  . Alcohol use: No    Alcohol/week: 0.0 standard drinks   Objective:   BP 110/72 (BP Location: Left Arm, Patient Position: Sitting, Cuff Size: Normal)   Pulse 93   Temp 97.9 F (36.6 C) (Oral)   Resp 16   Wt 116 lb 12.8 oz (53 kg)   SpO2 99%   BMI 24.84 kg/m  Vitals:   10/08/17 0807  BP: 110/72  Pulse: 93  Resp: 16  Temp: 97.9 F (36.6 C)  TempSrc: Oral  SpO2: 99%  Weight: 116 lb 12.8 oz (53 kg)     Physical Exam  Constitutional: She is oriented to person, place, and time. She appears well-developed and well-nourished. No distress.  Neck: Normal range of motion. Neck supple.  Cardiovascular: Normal rate, regular rhythm and normal heart sounds. Exam reveals no gallop and no friction rub.  No murmur heard. Pulmonary/Chest: Effort normal and breath sounds normal. No respiratory distress. She has no wheezes. She has no rales.  Neurological: She is alert and oriented to person, place, and time. No cranial nerve deficit or sensory deficit. She exhibits normal muscle tone. Coordination normal.  Skin: She  is not diaphoretic.  Vitals reviewed.      Assessment & Plan:     1. Dizziness Discussed starting a log to find a trigger since it is only occurring rarely. Call if symptoms worsen or if she has any other issue.   2. Colon cancer screening - Ambulatory referral to Gastroenterology  3. Breast cancer screening - MM 3D SCREEN BREAST BILATERAL; Future  4. Difficulty sleeping Stable. Diagnosis pulled for medication refill. Continue current medical treatment plan. - clonazePAM (KLONOPIN) 1 MG tablet; Take 1 tablet (1 mg total) by mouth 2 (two) times daily as needed for anxiety.  Dispense: 20 tablet; Refill: [place], PA-C  [facility]

## 2017-10-12 ENCOUNTER — Encounter: Payer: Self-pay | Admitting: Physician Assistant

## 2017-10-12 ENCOUNTER — Ambulatory Visit: Payer: BLUE CROSS/BLUE SHIELD | Admitting: Physician Assistant

## 2017-10-12 VITALS — BP 112/78 | HR 94 | Temp 98.4°F | Resp 16 | Wt 118.2 lb

## 2017-10-12 DIAGNOSIS — IMO0001 Reserved for inherently not codable concepts without codable children: Secondary | ICD-10-CM

## 2017-10-12 DIAGNOSIS — H8143 Vertigo of central origin, bilateral: Secondary | ICD-10-CM

## 2017-10-12 DIAGNOSIS — K860 Alcohol-induced chronic pancreatitis: Secondary | ICD-10-CM

## 2017-10-12 DIAGNOSIS — F17200 Nicotine dependence, unspecified, uncomplicated: Secondary | ICD-10-CM | POA: Insufficient documentation

## 2017-10-12 DIAGNOSIS — E1142 Type 2 diabetes mellitus with diabetic polyneuropathy: Secondary | ICD-10-CM | POA: Diagnosis not present

## 2017-10-12 DIAGNOSIS — Z794 Long term (current) use of insulin: Secondary | ICD-10-CM

## 2017-10-12 DIAGNOSIS — R0789 Other chest pain: Secondary | ICD-10-CM | POA: Diagnosis not present

## 2017-10-12 DIAGNOSIS — R079 Chest pain, unspecified: Secondary | ICD-10-CM | POA: Diagnosis not present

## 2017-10-12 MED ORDER — MECLIZINE HCL 25 MG PO TABS: 25.0000 mg | ORAL_TABLET | Freq: Three times a day (TID) | ORAL | 0 refills | Status: DC | PRN

## 2017-10-12 NOTE — Patient Instructions (Signed)
Dizziness °Dizziness is a common problem. It is a feeling of unsteadiness or light-headedness. You may feel like you are about to faint. Dizziness can lead to injury if you stumble or fall. Anyone can become dizzy, but dizziness is more common in older adults. This condition can be caused by a number of things, including medicines, dehydration, or illness. °Follow these instructions at home: °Eating and drinking °· Drink enough fluid to keep your urine clear or pale yellow. This helps to keep you from becoming dehydrated. Try to drink more clear fluids, such as water. °· Do not drink alcohol. °· Limit your caffeine intake if told to do so by your health care provider. Check ingredients and nutrition facts to see if a food or beverage contains caffeine. °· Limit your salt (sodium) intake if told to do so by your health care provider. Check ingredients and nutrition facts to see if a food or beverage contains sodium. °Activity °· Avoid making quick movements. °? Rise slowly from chairs and steady yourself until you feel okay. °? In the morning, first sit up on the side of the bed. When you feel okay, stand slowly while you hold onto something until you know that your balance is fine. °· If you need to stand in one place for a long time, move your legs often. Tighten and relax the muscles in your legs while you are standing. °· Do not drive or use heavy machinery if you feel dizzy. °· Avoid bending down if you feel dizzy. Place items in your home so that they are easy for you to reach without leaning over. °Lifestyle °· Do not use any products that contain nicotine or tobacco, such as cigarettes and e-cigarettes. If you need help quitting, ask your health care provider. °· Try to reduce your stress level by using methods such as yoga or meditation. Talk with your health care provider if you need help to manage your stress. °General instructions °· Watch your dizziness for any changes. °· Take over-the-counter and  prescription medicines only as told by your health care provider. Talk with your health care provider if you think that your dizziness is caused by a medicine that you are taking. °· Tell a friend or a family member that you are feeling dizzy. If he or she notices any changes in your behavior, have this person call your health care provider. °· Keep all follow-up visits as told by your health care provider. This is important. °Contact a health care provider if: °· Your dizziness does not go away. °· Your dizziness or light-headedness gets worse. °· You feel nauseous. °· You have reduced hearing. °· You have new symptoms. °· You are unsteady on your feet or you feel like the room is spinning. °Get help right away if: °· You vomit or have diarrhea and are unable to eat or drink anything. °· You have problems talking, walking, swallowing, or using your arms, hands, or legs. °· You feel generally weak. °· You are not thinking clearly or you have trouble forming sentences. It may take a friend or family member to notice this. °· You have chest pain, abdominal pain, shortness of breath, or sweating. °· Your vision changes. °· You have any bleeding. °· You have a severe headache. °· You have neck pain or a stiff neck. °· You have a fever. °These symptoms may represent a serious problem that is an emergency. Do not wait to see if the symptoms will go away. Get medical help   right away. Call your local emergency services (911 in the [place] Do not drive yourself to the hospital. °Summary °· Dizziness is a feeling of unsteadiness or light-headedness. This condition can be caused by a number of things, including medicines, dehydration, or illness. °· Anyone can become dizzy, but dizziness is more common in older adults. °· Drink enough fluid to keep your urine clear or pale yellow. Do not drink alcohol. °· Avoid making quick movements if you feel dizzy. Monitor your dizziness for any changes. °This information is not intended to  replace advice given to you by your health care provider. Make sure you discuss any questions you have with your health care provider. °Document Released: 08/06/2000 Document Revised: 03/15/2016 Document Reviewed: 03/15/2016 °Elsevier Interactive Patient Education © 2018 [employer] ° °

## 2017-10-12 NOTE — Progress Notes (Signed)
Patient: Veronica Lozano Female    DOB: Jan 21, 1966   52 y.o.   MRN: [id number] Visit Date: 10/12/2017  Today's Provider: Mar Daring, PA-C   Chief Complaint  Patient presents with  . Dizziness   Subjective:    Dizziness  This is a new (Reports that she left her work yesterday due to the dizziness. ) problem. The current episode started yesterday. The problem occurs constantly. The problem has been gradually worsening. Associated symptoms include chest pain (left side off and on since yesterday), fatigue and headaches. Pertinent negatives include no abdominal pain, nausea, numbness (tingling left arm), vomiting or weakness. Associated symptoms comments: She reports she had jaw pain, left side yesterday and arm tingling left side. Reports that her blood pressure yesterday was 117/82 and sugar level was 139. Exacerbated by: she reports that she was at work yesterday when it started, has not resolved yet. She has tried rest and sleep for the symptoms. The treatment provided no relief.   Patient reports that when she awoke on Sunday she just felt "off". She went to work and about halfway through the day developed a headache and immediate dizziness. She reports the "dizziness" felt like the "floor was coming up and going to hit her in the head." She developed some blurred vision. Within a minute she developed left sided chest pressure/tightness, tingling down her left arm. After about a minute of this she then developed severe left sided jaw pain. She went and sat down as she felt she may pass out. Her manager advised her to go to the ER or that she would call 9-1-1 but patient refused. She sat for about 30 minutes and then felt well enough to drive herself home. She reports she is still feeling "off" and continues to have some of the left sided chest pressure, jaw pain and tingling in the left arm.   She has had the "dizzy" spells before. Last one prior to this one had been a Saturday  one week previously, but did not have the other effects or last as long. Reports these "episdodes" are becoming more frequent she feels. Denies any hearing loss or tinnitus during episodes. Does have blurred vision during but no double vision.   She has had cardiac eval last year with stress testing by Dr. Saralyn Pilar prior to the removal of the head of the pancreas for chronic pancreatitis. She also reports having a cardiac cath in 2012 in [place], reports normal. She does continue to smoke, but is down to 1/4-1/2 PPD.     Allergies  Allergen Reactions  . Latex Hives and Swelling  . Coconut Oil Hives  . Dapagliflozin     Other reaction(s): Other (See Comments) Yeast infection  . Empagliflozin     Other reaction(s): Other (See Comments) Yeast infection  . Glipizide Hives  . Metformin Hives  . Pantoprazole     Other reaction(s): Vomiting  . Morphine And Related Itching and Rash     Current Outpatient Medications:  .  acyclovir (ZOVIRAX) 800 MG tablet, take 1 tablet by mouth once daily if needed, Disp: 90 tablet, Rfl: 3 .  Blood Glucose Monitoring Suppl (CONTOUR NEXT MONITOR) w/Device KIT, use to TEST BLOOD SUGAR, Disp: , Rfl: 0 .  Blood Glucose Monitoring Suppl (GLUCOCOM BLOOD GLUCOSE MONITOR) DEVI, Contour Next Link Blood Glucose Meter, Disp: , Rfl:  .  clonazePAM (KLONOPIN) 1 MG tablet, Take 1 tablet (1 mg total) by mouth 2 (two) times  daily as needed for anxiety., Disp: 20 tablet, Rfl: 1 .  empagliflozin (JARDIANCE) 25 MG TABS tablet, Take by mouth., Disp: , Rfl:  .  insulin aspart (NOVOLOG) 100 UNIT/ML injection, Inject 2 Units 3 (three) times daily before meals into the skin. Sliding scale, Disp: , Rfl:  .  insulin glargine (LANTUS) 100 UNIT/ML injection, Inject 0.35 mLs (35 Units total) into the skin at bedtime. (Patient taking differently: Inject 10 Units at bedtime into the skin. ), Disp: 10 mL, Rfl: 11 .  Pancrelipase, Lip-Prot-Amyl, (CREON) 24000 UNITS CPEP, Take 24,000 Units  by mouth 3 (three) times daily with meals. 2 tablets with meals and 1 tablet with snacks, Disp: , Rfl:  .  acetaminophen (TYLENOL) 325 MG tablet, Take 650 mg by mouth., Disp: , Rfl:   Review of Systems  Constitutional: Positive for fatigue.  HENT: Negative.  Negative for hearing loss and tinnitus.   Eyes: Positive for visual disturbance (blurred vision during episodes).  Respiratory: Positive for chest tightness (most recent episode only) and shortness of breath (most recent episode only).   Cardiovascular: Positive for chest pain (left side off and on since yesterday). Negative for palpitations and leg swelling.  Gastrointestinal: Negative for abdominal pain, nausea and vomiting.  Neurological: Positive for dizziness and headaches. Negative for syncope, speech difficulty, weakness and numbness (tingling left arm).    Social History   Tobacco Use  . Smoking status: Current Every Day Smoker    Packs/day: 0.25    Years: 25.00    Pack years: 6.25    Types: Cigarettes  . Smokeless tobacco: Never Used  Substance Use Topics  . Alcohol use: No    Alcohol/week: 0.0 standard drinks   Objective:   BP 112/78 (BP Location: Left Arm, Patient Position: Sitting, Cuff Size: Normal)   Pulse 94   Temp 98.4 F (36.9 C) (Oral)   Resp 16   Wt 118 lb 3.2 oz (53.6 kg)   SpO2 98%   BMI 25.14 kg/m  Vitals:   10/12/17 1119  BP: 112/78  Pulse: 94  Resp: 16  Temp: 98.4 F (36.9 C)  TempSrc: Oral  SpO2: 98%  Weight: 118 lb 3.2 oz (53.6 kg)     Physical Exam  Constitutional: She is oriented to person, place, and time. She appears well-developed and well-nourished. No distress.  Neck: Normal range of motion. Neck supple.  Cardiovascular: Normal rate, regular rhythm and normal heart sounds. Exam reveals no gallop and no friction rub.  No murmur heard. Pulmonary/Chest: Effort normal and breath sounds normal. No respiratory distress. She has no wheezes. She has no rales.  Neurological: She is  alert and oriented to person, place, and time. She has normal strength. No cranial nerve deficit or sensory deficit. She displays a negative Romberg sign. Coordination and gait normal.  Skin: Capillary refill takes less than 2 seconds. She is not diaphoretic.  Psychiatric: She has a normal mood and affect. Her behavior is normal. Judgment and thought content normal.  Vitals reviewed.      Assessment & Plan:     1. Vertigo of central origin of both ears R/O vitamin def, thyroid, anemia, neurogenic source or cardiogenic source. Labs will be checked as below. I will f/u pending results. MRI ordered to r/o neurogenic/central cause of symptoms. Cardiology referral placed back to Dr. Saralyn Pilar for further evaluation. I will f/u pending all results. Meclizine given for acute dizziness. Call if symptoms worsen or change.  - CBC w/Diff/Platelet - Basic Metabolic Panel (  BMET) - TSH - Vitamin D (25 hydroxy) - B12 - Ambulatory referral to Cardiology - [place]; Future - meclizine (ANTIVERT) 25 MG tablet; Take 1 tablet (25 mg total) by mouth 3 (three) times daily as needed for dizziness.  Dispense: 30 tablet; Refill: 0  2. Other chest pain EKG today showed NSR rate of 80 with low voltage most likely secondary to pulmonary disease/smoking. See above medical treatment plan. - EKG 12-Lead - CBC w/Diff/Platelet - Basic Metabolic Panel (BMET) - TSH - Vitamin D (25 hydroxy) - B12 - Ambulatory referral to Cardiology  3. Chest pain with high risk for cardiac etiology See above medical treatment plan. - CBC w/Diff/Platelet - Basic Metabolic Panel (BMET) - TSH - Vitamin D (25 hydroxy) - B12 - Ambulatory referral to Cardiology  4. Type 2 diabetes mellitus with diabetic polyneuropathy, with long-term current use of insulin ([place]) See above medical treatment plan. - CBC w/Diff/Platelet - Basic Metabolic Panel (BMET) - TSH - Vitamin D (25 hydroxy) - B12  5. 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 See above medical treatment plan. - CBC w/Diff/Platelet - Basic Metabolic Panel (BMET) - TSH - Vitamin D (25 hydroxy) - B12 - Ambulatory referral to Cardiology  6. Smoker - CBC w/Diff/Platelet - Basic Metabolic Panel (BMET) - TSH - Vitamin D (25 hydroxy) - B12 - Ambulatory referral to Cardiology       Mar Daring, PA-C  [facility]

## 2017-10-13 ENCOUNTER — Telehealth: Payer: Self-pay

## 2017-10-13 ENCOUNTER — Encounter: Payer: Self-pay | Admitting: Physician Assistant

## 2017-10-13 DIAGNOSIS — R519 Headache, unspecified: Secondary | ICD-10-CM

## 2017-10-13 DIAGNOSIS — R51 Headache: Principal | ICD-10-CM

## 2017-10-13 LAB — BASIC METABOLIC PANEL
BUN / CREAT RATIO: 14 (ref 9–23)
BUN: 7 mg/dL (ref 6–24)
CALCIUM: 9.9 mg/dL (ref 8.7–10.2)
CHLORIDE: 102 mmol/L (ref 96–106)
CO2: 24 mmol/L (ref 20–29)
CREATININE: 0.49 mg/dL — AB (ref 0.57–1.00)
GFR calc Af Amer: 130 mL/min/{1.73_m2} (ref >59)
GFR calc non Af Amer: 113 mL/min/{1.73_m2} (ref >59)
GLUCOSE: 72 mg/dL (ref 65–99)
Potassium: 4.3 mmol/L (ref 3.5–5.2)
Sodium: 141 mmol/L (ref 134–144)

## 2017-10-13 LAB — CBC WITH DIFFERENTIAL/PLATELET
BASOS ABS: 0 10*3/uL (ref 0.0–0.2)
BASOS: 0 %
EOS (ABSOLUTE): 0.1 10*3/uL (ref 0.0–0.4)
Eos: 1 %
HEMOGLOBIN: 16.4 g/dL — AB (ref 11.1–15.9)
Hematocrit: 49 % — ABNORMAL HIGH (ref 34.0–46.6)
IMMATURE GRANS (ABS): 0 10*3/uL (ref 0.0–0.1)
IMMATURE GRANULOCYTES: 0 %
LYMPHS: 41 %
Lymphocytes Absolute: 4.1 10*3/uL — ABNORMAL HIGH (ref 0.7–3.1)
MCH: 29.4 pg (ref 26.6–33.0)
MCHC: 33.5 g/dL (ref 31.5–35.7)
MCV: 88 fL (ref 79–97)
MONOCYTES: 4 %
Monocytes Absolute: 0.4 10*3/uL (ref 0.1–0.9)
NEUTROS ABS: 5.2 10*3/uL (ref 1.4–7.0)
NEUTROS PCT: 54 %
PLATELETS: 304 10*3/uL (ref 150–450)
RBC: 5.57 x10E6/uL — ABNORMAL HIGH (ref 3.77–5.28)
RDW: 13.5 % (ref 12.3–15.4)
WBC: 9.8 10*3/uL (ref 3.4–10.8)

## 2017-10-13 LAB — VITAMIN B12: VITAMIN B 12: 372 pg/mL (ref 232–1245)

## 2017-10-13 LAB — TSH: TSH: 0.83 u[IU]/mL (ref 0.450–4.500)

## 2017-10-13 LAB — VITAMIN D 25 HYDROXY (VIT D DEFICIENCY, FRACTURES): VIT D 25 HYDROXY: 28.6 ng/mL — AB (ref 30.0–100.0)

## 2017-10-13 MED ORDER — BUTALBITAL-APAP-CAFFEINE 50-325-40 MG PO TABS: 1.0000 | ORAL_TABLET | Freq: Four times a day (QID) | ORAL | 0 refills | Status: AC | PRN

## 2017-10-13 NOTE — Telephone Encounter (Signed)
-----   Message from Margaretann LovelessJennifer M Burnette, [place]-C sent at 10/13/2017  4:29 PM EDT ----- Vit D is borderline low. Can benefit from taking VIT D [phone] IU daily OTC. Blood count is stable. Kidney function is stable. Electrolytes normal. Sugar lower at 72. Thyroid normal. B12 normal.

## 2017-10-13 NOTE — Telephone Encounter (Signed)
-----   Message from Jennifer M Burnette, PA-C sent at 10/13/2017  4:29 PM EDT ----- Vit D is borderline low. Can benefit from taking VIT D 800-1000 IU daily OTC. Blood count is stable. Kidney function is stable. Electrolytes normal. Sugar lower at 72. Thyroid normal. B12 normal. 

## 2017-10-13 NOTE — Telephone Encounter (Signed)
Viewed by Beverly Milchoma J Span on 10/13/2017 4:32 PM

## 2017-10-14 ENCOUNTER — Telehealth: Payer: Self-pay

## 2017-10-14 NOTE — Telephone Encounter (Signed)
faxed

## 2017-10-14 NOTE — Telephone Encounter (Signed)
[facility] requesting that we fax patients recent EKG report to [phone]. KW

## 2017-10-23 ENCOUNTER — Encounter: Payer: Self-pay | Admitting: Physician Assistant

## 2017-10-23 DIAGNOSIS — F40240 Claustrophobia: Secondary | ICD-10-CM

## 2017-10-23 NOTE — Telephone Encounter (Signed)
Patient states that she will be having the MRI on 10/30/2017.  [facility] [address].

## 2017-10-25 ENCOUNTER — Encounter: Payer: Self-pay | Admitting: Physician Assistant

## 2017-10-25 ENCOUNTER — Other Ambulatory Visit: Payer: PRIVATE HEALTH INSURANCE

## 2017-10-27 MED ORDER — LORAZEPAM 1 MG PO TABS: ORAL_TABLET | ORAL | 0 refills | Status: DC

## 2017-10-27 NOTE — Addendum Note (Signed)
Addended by: Margaretann Loveless on: 10/27/2017 03:19 PM   Modules accepted: Orders

## 2017-10-30 ENCOUNTER — Ambulatory Visit
Admission: RE | Admit: 2017-10-30 | Discharge: 2017-10-30 | Disposition: A | Discharge status: Home / Self Care | Payer: BLUE CROSS/BLUE SHIELD | Source: Ambulatory Visit | Attending: Physician Assistant | Admitting: Physician Assistant

## 2017-10-30 DIAGNOSIS — H8143 Vertigo of central origin, bilateral: Secondary | ICD-10-CM | POA: Diagnosis present

## 2017-10-30 DIAGNOSIS — IMO0001 Reserved for inherently not codable concepts without codable children: Secondary | ICD-10-CM

## 2017-11-03 ENCOUNTER — Telehealth: Payer: Self-pay

## 2017-11-03 ENCOUNTER — Telehealth: Payer: Self-pay | Admitting: Physician Assistant

## 2017-11-03 DIAGNOSIS — IMO0001 Reserved for inherently not codable concepts without codable children: Secondary | ICD-10-CM

## 2017-11-03 NOTE — Telephone Encounter (Signed)
Pt is requesting call back to see if the results of her MRI that was done on 10/30/17 were back. Please advise. Thanks TNP

## 2017-11-03 NOTE — Telephone Encounter (Signed)
-----   Message from Margaretann Loveless, [place] sent at 11/03/2017 10:15 AM EDT ----- Brain MRI is normal. There is some small vessel disease noted but this is commonly seen with smoking. This would not cause your symptoms however.

## 2017-11-03 NOTE — Telephone Encounter (Signed)
Patient advised as directed below. She states "know what? What's the next step"  Thanks,  -Vanderbilt Ranieri

## 2017-11-04 NOTE — Telephone Encounter (Signed)
I would recommend neurology referral if she agrees.

## 2017-11-05 NOTE — Telephone Encounter (Signed)
Patient advised as below.  

## 2017-11-05 NOTE — Telephone Encounter (Signed)
Referral placed.

## 2017-11-19 ENCOUNTER — Encounter: Payer: Self-pay | Admitting: Physician Assistant

## 2017-11-19 DIAGNOSIS — G479 Sleep disorder, unspecified: Secondary | ICD-10-CM

## 2017-11-19 MED ORDER — CLONAZEPAM 1 MG PO TABS: 1.0000 mg | ORAL_TABLET | Freq: Two times a day (BID) | ORAL | 1 refills | Status: DC | PRN

## 2017-12-30 NOTE — Progress Notes (Signed)
Patient: Veronica Lozano Female    DOB: 1965/05/18   52 y.o.   MRN: [id number] Visit Date: 01/01/2018  Today's Provider: Mar Daring, PA-C   Chief Complaint  Patient presents with  . Urinary Tract Infection   Subjective:    Urinary Tract Infection   This is a new problem. The current episode started 1 to 4 weeks ago. The problem has been gradually worsening. Associated symptoms include a discharge ("pinkish"). Pertinent negatives include no frequency or urgency. Associated symptoms comments: "strong smell". She has tried increased fluids for the symptoms. The treatment provided no relief.  No history of kidney stones. Is on Jardiance which causes glucosuria. No vaginal discharge or irritation noted.     Allergies  Allergen Reactions  . Latex Hives and Swelling  . Coconut Oil Hives  . Dapagliflozin     Other reaction(s): Other (See Comments) Yeast infection  . Empagliflozin     Other reaction(s): Other (See Comments) Yeast infection  . Glipizide Hives  . Metformin Hives  . Pantoprazole     Other reaction(s): Vomiting  . Morphine And Related Itching and Rash     Current Outpatient Medications:  .  acetaminophen (TYLENOL) 325 MG tablet, Take 650 mg by mouth., Disp: , Rfl:  .  acyclovir (ZOVIRAX) 800 MG tablet, take 1 tablet by mouth once daily if needed, Disp: 90 tablet, Rfl: 3 .  Blood Glucose Monitoring Suppl (CONTOUR NEXT MONITOR) w/Device KIT, use to TEST BLOOD SUGAR, Disp: , Rfl: 0 .  Blood Glucose Monitoring Suppl (GLUCOCOM BLOOD GLUCOSE MONITOR) DEVI, Contour Next Link Blood Glucose Meter, Disp: , Rfl:  .  butalbital-acetaminophen-caffeine (FIORICET, ESGIC) 50-325-40 MG tablet, Take 1 tablet by mouth every 6 (six) hours as needed for headache., Disp: 20 tablet, Rfl: 0 .  clonazePAM (KLONOPIN) 1 MG tablet, Take 1 tablet (1 mg total) by mouth 2 (two) times daily as needed for anxiety., Disp: 60 tablet, Rfl: 1 .  empagliflozin (JARDIANCE) 25 MG TABS tablet,  Take by mouth., Disp: , Rfl:  .  insulin aspart (NOVOLOG) 100 UNIT/ML injection, Inject 2 Units 3 (three) times daily before meals into the skin. Sliding scale, Disp: , Rfl:  .  insulin glargine (LANTUS) 100 UNIT/ML injection, Inject 0.35 mLs (35 Units total) into the skin at bedtime. (Patient taking differently: Inject 10 Units at bedtime into the skin. ), Disp: 10 mL, Rfl: 11 .  meclizine (ANTIVERT) 25 MG tablet, Take 1 tablet (25 mg total) by mouth 3 (three) times daily as needed for dizziness., Disp: 30 tablet, Rfl: 0 .  Pancrelipase, Lip-Prot-Amyl, (CREON) 24000 UNITS CPEP, Take 24,000 Units by mouth 3 (three) times daily with meals. 2 tablets with meals and 1 tablet with snacks, Disp: , Rfl:   Review of Systems  HENT: Positive for congestion, ear pain, postnasal drip, rhinorrhea and sore throat.   Respiratory: Positive for cough.   Genitourinary: Negative for frequency, urgency and vaginal pain.  Musculoskeletal: Positive for back pain (Lower).    Social History   Tobacco Use  . Smoking status: Current Every Day Smoker    Packs/day: 0.25    Years: 25.00    Pack years: 6.25    Types: Cigarettes  . Smokeless tobacco: Never Used  Substance Use Topics  . Alcohol use: No    Alcohol/week: 0.0 standard drinks   Objective:   BP 120/70 (BP Location: Left Arm, Patient Position: Sitting, Cuff Size: Normal)   Pulse 97  Temp 97.9 F (36.6 C) (Oral)   Resp 16   Wt 128 lb 8 oz (58.3 kg)   SpO2 99%   BMI 27.33 kg/m  Vitals:   01/01/18 0659  BP: 120/70  Pulse: 97  Resp: 16  Temp: 97.9 F (36.6 C)  TempSrc: Oral  SpO2: 99%  Weight: 128 lb 8 oz (58.3 kg)     Physical Exam  Constitutional: She is oriented to person, place, and time. She appears well-developed and well-nourished. No distress.  Cardiovascular: Normal rate, regular rhythm and normal heart sounds. Exam reveals no gallop and no friction rub.  No murmur heard. Pulmonary/Chest: Effort normal and breath sounds normal.  No respiratory distress. She has no wheezes. She has no rales.  Abdominal: Soft. Normal appearance and bowel sounds are normal. She exhibits no distension and no mass. There is no hepatosplenomegaly. There is no tenderness. There is no rebound, no guarding and no CVA tenderness.  Hurting on the right flank area.  Neurological: She is alert and oriented to person, place, and time.  Skin: Skin is warm and dry. She is not diaphoretic.       Assessment & Plan:     1. Abnormal urine odor UA shows small amount of blood (has had this for a while), 2000 glucose but otherwise unremarkable. Will send for culture as below. I will treat through the weekend empirically with Cipro and await C &S results. Call if worsening symptoms.  - POCT urinalysis dipstick - Urine Culture - ciprofloxacin (CIPRO) 500 MG tablet; Take 1 tablet (500 mg total) by mouth 2 (two) times daily.  Dispense: 14 tablet; Refill: 0       Mar Daring, PA-C  [facility]

## 2018-01-01 ENCOUNTER — Encounter: Payer: Self-pay | Admitting: Physician Assistant

## 2018-01-01 ENCOUNTER — Ambulatory Visit: Payer: BLUE CROSS/BLUE SHIELD | Admitting: Physician Assistant

## 2018-01-01 VITALS — BP 120/70 | HR 97 | Temp 97.9°F | Resp 16 | Wt 128.5 lb

## 2018-01-01 DIAGNOSIS — R8290 Unspecified abnormal findings in urine: Secondary | ICD-10-CM | POA: Diagnosis not present

## 2018-01-01 LAB — POCT URINALYSIS DIPSTICK
BILIRUBIN UA: NEGATIVE
Glucose, UA: POSITIVE — AB
Ketones, UA: NEGATIVE
LEUKOCYTES UA: NEGATIVE
NITRITE UA: NEGATIVE
PH UA: 6 (ref 5.0–8.0)
PROTEIN UA: NEGATIVE
RBC UA: NEGATIVE
Spec Grav, UA: 1.015 (ref 1.010–1.025)
UROBILINOGEN UA: 0.2 U/dL

## 2018-01-01 MED ORDER — CIPROFLOXACIN HCL 500 MG PO TABS: 500.0000 mg | ORAL_TABLET | Freq: Two times a day (BID) | ORAL | 0 refills | Status: DC

## 2018-01-03 LAB — URINE CULTURE

## 2018-01-04 ENCOUNTER — Telehealth: Payer: Self-pay

## 2018-01-04 NOTE — Telephone Encounter (Signed)
Patient was advised.  

## 2018-01-04 NOTE — Telephone Encounter (Signed)
-----   Message from Margaretann Loveless, [place] sent at 01/04/2018  8:02 AM EST ----- Urine culture was positive for klebsiella. It is sensitive to the antibiotic I placed you on. Call if symptoms do not completely resolve.

## 2018-01-06 ENCOUNTER — Encounter: Payer: Self-pay | Admitting: Physician Assistant

## 2018-01-06 DIAGNOSIS — L0291 Cutaneous abscess, unspecified: Secondary | ICD-10-CM

## 2018-01-06 DIAGNOSIS — B379 Candidiasis, unspecified: Secondary | ICD-10-CM

## 2018-01-06 DIAGNOSIS — T3695XA Adverse effect of unspecified systemic antibiotic, initial encounter: Secondary | ICD-10-CM

## 2018-01-06 MED ORDER — DOXYCYCLINE HYCLATE 100 MG PO TABS: 100.0000 mg | ORAL_TABLET | Freq: Two times a day (BID) | ORAL | 0 refills | Status: DC

## 2018-01-06 MED ORDER — FLUCONAZOLE 150 MG PO TABS: 150.0000 mg | ORAL_TABLET | Freq: Once | ORAL | 0 refills | Status: AC

## 2018-02-01 ENCOUNTER — Telehealth: Payer: Self-pay | Admitting: Physician Assistant

## 2018-02-01 NOTE — Telephone Encounter (Signed)
Pt needing to discuss with a nurse regarding her medication refills that will not be covered in 2020.  Please advise.  Thanks, [facility]

## 2018-02-05 ENCOUNTER — Ambulatory Visit: Payer: BLUE CROSS/BLUE SHIELD | Admitting: Physician Assistant

## 2018-02-05 ENCOUNTER — Encounter: Payer: Self-pay | Admitting: Physician Assistant

## 2018-02-05 VITALS — BP 125/83 | HR 83 | Temp 98.0°F | Resp 16 | Wt 129.0 lb

## 2018-02-05 DIAGNOSIS — Z794 Long term (current) use of insulin: Secondary | ICD-10-CM

## 2018-02-05 DIAGNOSIS — E1142 Type 2 diabetes mellitus with diabetic polyneuropathy: Secondary | ICD-10-CM

## 2018-02-05 DIAGNOSIS — K860 Alcohol-induced chronic pancreatitis: Secondary | ICD-10-CM

## 2018-02-05 DIAGNOSIS — Z1239 Encounter for other screening for malignant neoplasm of breast: Secondary | ICD-10-CM

## 2018-02-05 NOTE — Progress Notes (Signed)
Patient: Veronica Lozano Female    DOB: 06/19/1965   52 y.o.   MRN: [id number] Visit Date: 02/05/2018  Today's Provider: Mar Daring, PA-C   Chief Complaint  Patient presents with  . Follow-up   Subjective:     HPI  Patient here to follow up and check on medications. She is having her insurance change and unfortunately we will not be in network any longer. She is establishing with a new physician with [facility]. She has appt in march. She is requesting for medications to be refilled to cover until she is seen there.   Allergies  Allergen Reactions  . Latex Hives and Swelling  . Coconut Oil Hives  . Dapagliflozin     Other reaction(s): Other (See Comments) Yeast infection  . Glipizide Hives  . Metformin Hives  . Pantoprazole     Other reaction(s): Vomiting  . Morphine And Related Itching and Rash     Current Outpatient Medications:  .  acetaminophen (TYLENOL) 325 MG tablet, Take 650 mg by mouth., Disp: , Rfl:  .  atorvastatin (LIPITOR) 20 MG tablet, Take by mouth., Disp: , Rfl:  .  Blood Glucose Monitoring Suppl (CONTOUR NEXT MONITOR) w/Device KIT, use to TEST BLOOD SUGAR, Disp: , Rfl: 0 .  Blood Glucose Monitoring Suppl (GLUCOCOM BLOOD GLUCOSE MONITOR) DEVI, Contour Next Link Blood Glucose Meter, Disp: , Rfl:  .  butalbital-acetaminophen-caffeine (FIORICET, ESGIC) 50-325-40 MG tablet, Take 1 tablet by mouth every 6 (six) hours as needed for headache., Disp: 20 tablet, Rfl: 0 .  clonazePAM (KLONOPIN) 1 MG tablet, Take 1 tablet (1 mg total) by mouth 2 (two) times daily as needed for anxiety., Disp: 60 tablet, Rfl: 1 .  empagliflozin (JARDIANCE) 25 MG TABS tablet, Take by mouth., Disp: , Rfl:  .  insulin aspart (NOVOLOG) 100 UNIT/ML injection, Inject 2 Units 3 (three) times daily before meals into the skin. Sliding scale, Disp: , Rfl:  .  insulin glargine (LANTUS) 100 UNIT/ML injection, Inject 0.35 mLs (35 Units total) into the skin at bedtime. (Patient taking  differently: Inject 10 Units at bedtime into the skin. ), Disp: 10 mL, Rfl: 11 .  meclizine (ANTIVERT) 25 MG tablet, Take 1 tablet (25 mg total) by mouth 3 (three) times daily as needed for dizziness., Disp: 30 tablet, Rfl: 0 .  Pancrelipase, Lip-Prot-Amyl, (CREON) 24000 UNITS CPEP, Take 24,000 Units by mouth 3 (three) times daily with meals. 2 tablets with meals and 1 tablet with snacks, Disp: , Rfl:  .  acyclovir (ZOVIRAX) 800 MG tablet, take 1 tablet by mouth once daily if needed (Patient not taking: Reported on 02/05/2018), Disp: 90 tablet, Rfl: 3  Review of Systems  Constitutional: Negative.   Respiratory: Negative.   Cardiovascular: Negative.   Neurological: Negative.     Social History   Tobacco Use  . Smoking status: Current Every Day Smoker    Packs/day: 0.25    Years: 25.00    Pack years: 6.25    Types: Cigarettes  . Smokeless tobacco: Never Used  Substance Use Topics  . Alcohol use: No    Alcohol/week: 0.0 standard drinks      Objective:   BP 125/83 (BP Location: Left Arm, Patient Position: Sitting, Cuff Size: Normal)   Pulse 83   Temp 98 F (36.7 C) (Oral)   Resp 16   Wt 129 lb (58.5 kg)   BMI 27.43 kg/m  Vitals:   02/05/18 1636  BP: 125/83  Pulse: 83  Resp: 16  Temp: 98 F (36.7 C)  TempSrc: Oral  Weight: 129 lb (58.5 kg)     Physical Exam Vitals signs reviewed.  Constitutional:      General: She is not in acute distress.    Appearance: She is well-developed. She is not diaphoretic.  Neck:     Musculoskeletal: Normal range of motion and neck supple.     Thyroid: No thyromegaly.     Vascular: No JVD.     Trachea: No tracheal deviation.  Cardiovascular:     Rate and Rhythm: Normal rate and regular rhythm.     Heart sounds: Normal heart sounds. No murmur. No friction rub. No gallop.   Pulmonary:     Effort: Pulmonary effort is normal. No respiratory distress.     Breath sounds: Normal breath sounds. No wheezing or rales.  Lymphadenopathy:      Cervical: No cervical adenopathy.         Assessment & Plan    1. Breast cancer screening There is no family history of breast cancer. She does perform regular self breast exams. Mammogram was ordered as below. Information for [facility] clinic was given to patient so she may schedule her mammogram at her convenience. - MM 3D SCREEN BREAST BILATERAL; Future  2. Alcohol-induced chronic pancreatitis (HCC) Stable. Followed by [facility]. S/P whipple.   3. Type 2 diabetes mellitus with diabetic polyneuropathy, with long-term current use of insulin (HCC) Stable. Followed by [facility].     Mar Daring, PA-C  [facility]

## 2018-02-08 ENCOUNTER — Other Ambulatory Visit: Payer: Self-pay | Admitting: Physician Assistant

## 2018-02-08 DIAGNOSIS — G479 Sleep disorder, unspecified: Secondary | ICD-10-CM

## 2018-02-09 ENCOUNTER — Encounter: Payer: Self-pay | Admitting: Physician Assistant

## 2018-02-09 DIAGNOSIS — K8689 Other specified diseases of pancreas: Secondary | ICD-10-CM

## 2018-02-10 MED ORDER — PANCRELIPASE (LIP-PROT-AMYL) 24000-76000 UNITS PO CPEP: 24000.0000 [IU] | ORAL_CAPSULE | Freq: Three times a day (TID) | ORAL | 1 refills | Status: DC

## 2018-02-11 ENCOUNTER — Encounter: Payer: Self-pay | Admitting: Physician Assistant

## 2018-02-11 DIAGNOSIS — N3000 Acute cystitis without hematuria: Secondary | ICD-10-CM

## 2018-02-12 MED ORDER — SULFAMETHOXAZOLE-TRIMETHOPRIM 800-160 MG PO TABS: 1.0000 | ORAL_TABLET | Freq: Two times a day (BID) | ORAL | 0 refills | Status: DC

## 2018-06-10 ENCOUNTER — Encounter: Payer: Self-pay | Admitting: Physician Assistant

## 2018-06-10 ENCOUNTER — Telehealth: Payer: Self-pay | Admitting: Physician Assistant

## 2018-06-10 ENCOUNTER — Ambulatory Visit (INDEPENDENT_AMBULATORY_CARE_PROVIDER_SITE_OTHER): Payer: BLUE CROSS/BLUE SHIELD | Admitting: Physician Assistant

## 2018-06-10 DIAGNOSIS — K8689 Other specified diseases of pancreas: Secondary | ICD-10-CM | POA: Diagnosis not present

## 2018-06-10 DIAGNOSIS — G479 Sleep disorder, unspecified: Secondary | ICD-10-CM

## 2018-06-10 DIAGNOSIS — K860 Alcohol-induced chronic pancreatitis: Secondary | ICD-10-CM

## 2018-06-10 DIAGNOSIS — R05 Cough: Secondary | ICD-10-CM

## 2018-06-10 DIAGNOSIS — R059 Cough, unspecified: Secondary | ICD-10-CM

## 2018-06-10 MED ORDER — PANCRELIPASE (LIP-PROT-AMYL) 24000-76000 UNITS PO CPEP: 24000.0000 [IU] | ORAL_CAPSULE | Freq: Three times a day (TID) | ORAL | 1 refills | Status: DC

## 2018-06-10 MED ORDER — CLONAZEPAM 1 MG PO TABS
ORAL_TABLET | ORAL | 5 refills | Status: DC
Start: 2018-06-10 — End: 2018-12-24

## 2018-06-10 MED ORDER — PSEUDOEPH-BROMPHEN-DM 30-2-10 MG/5ML PO SYRP: 5.0000 mL | ORAL_SOLUTION | Freq: Four times a day (QID) | ORAL | 0 refills | Status: DC | PRN

## 2018-06-10 NOTE — Patient Instructions (Signed)

## 2018-06-10 NOTE — Telephone Encounter (Signed)
Pharmacy called saying they are having a hard time with the directions sent on pt's Pancrelipase   Please clarify or resend rx .  She says it has two sets of directions  Thanks teri

## 2018-06-10 NOTE — Telephone Encounter (Signed)
Please Review

## 2018-06-10 NOTE — Progress Notes (Signed)
Virtual Visit via Video Note  I connected with [name] on 06/10/18 at  2:00 PM EDT by a video enabled telemedicine application and verified that I am speaking with the correct person using two identifiers. Patient was in her home and I was on site in the clinic.   I discussed the limitations of evaluation and management by telemedicine and the availability of in person appointments. The patient expressed understanding and agreed to proceed.  Mar Daring, PA-C   Patient: Veronica Lozano Female    DOB: May 09, 1965   53 y.o.   MRN: [id number] Visit Date: 06/10/2018  Today's Provider: Mar Daring, PA-C   No chief complaint on file.  Subjective:     HPI Patient would like to be re-established. She had to switch providers due to insurance, but since this office is now back in her network she would like to re-establish care.   She reports overall she is doing well. She has been furloughed for the time being from [place] secondary to Covid. She does report having a small cough, no fever. Reports that it is her allergies. Has been trying OTC medications. Has also used tessalon perles.   She also needs refills on a few of her medications.   Allergies  Allergen Reactions  . Latex Hives and Swelling  . Coconut Oil Hives  . Dapagliflozin     Other reaction(s): Other (See Comments) Yeast infection  . Glipizide Hives  . Metformin Hives  . Pantoprazole     Other reaction(s): Vomiting  . Morphine And Related Itching and Rash     Current Outpatient Medications:  .  acetaminophen (TYLENOL) 325 MG tablet, Take 650 mg by mouth., Disp: , Rfl:  .  acyclovir (ZOVIRAX) 800 MG tablet, take 1 tablet by mouth once daily if needed (Patient not taking: Reported on 02/05/2018), Disp: 90 tablet, Rfl: 3 .  atorvastatin (LIPITOR) 20 MG tablet, Take by mouth., Disp: , Rfl:  .  Blood Glucose Monitoring Suppl (CONTOUR NEXT MONITOR) w/Device KIT, use to TEST BLOOD SUGAR, Disp: ,  Rfl: 0 .  Blood Glucose Monitoring Suppl (GLUCOCOM BLOOD GLUCOSE MONITOR) DEVI, Contour Next Link Blood Glucose Meter, Disp: , Rfl:  .  butalbital-acetaminophen-caffeine (FIORICET, ESGIC) 50-325-40 MG tablet, Take 1 tablet by mouth every 6 (six) hours as needed for headache., Disp: 20 tablet, Rfl: 0 .  clonazePAM (KLONOPIN) 1 MG tablet, TAKE 1 TABLET(1 MG) BY MOUTH TWICE DAILY AS NEEDED FOR ANXIETY, Disp: 60 tablet, Rfl: 5 .  empagliflozin (JARDIANCE) 25 MG TABS tablet, Take by mouth., Disp: , Rfl:  .  insulin aspart (NOVOLOG) 100 UNIT/ML injection, Inject 2 Units 3 (three) times daily before meals into the skin. Sliding scale, Disp: , Rfl:  .  insulin glargine (LANTUS) 100 UNIT/ML injection, Inject 0.35 mLs (35 Units total) into the skin at bedtime. (Patient taking differently: Inject 10 Units at bedtime into the skin. ), Disp: 10 mL, Rfl: 11 .  meclizine (ANTIVERT) 25 MG tablet, Take 1 tablet (25 mg total) by mouth 3 (three) times daily as needed for dizziness., Disp: 30 tablet, Rfl: 0 .  Pancrelipase, Lip-Prot-Amyl, (CREON) 24000-76000 units CPEP, Take 1 capsule (24,000 Units total) by mouth 3 (three) times daily with meals. 2 tablets with meals and 1 tablet with snacks, Disp: 270 capsule, Rfl: 1 .  sulfamethoxazole-trimethoprim (BACTRIM DS,SEPTRA DS) 800-160 MG tablet, Take 1 tablet by mouth 2 (two) times daily., Disp: 20 tablet, Rfl: 0  Review of  Systems  Constitutional: Negative.   HENT: Positive for congestion, postnasal drip, sinus pressure and sneezing. Negative for sore throat.   Respiratory: Positive for cough.   Cardiovascular: Negative.   Gastrointestinal: Negative.   Neurological: Negative.   Psychiatric/Behavioral: Negative.     Social History   Tobacco Use  . Smoking status: Current Every Day Smoker    Packs/day: 0.25    Years: 25.00    Pack years: 6.25    Types: Cigarettes  . Smokeless tobacco: Never Used  Substance Use Topics  . Alcohol use: No    Alcohol/week: 0.0  standard drinks      Objective:   There were no vitals taken for this visit. There were no vitals filed for this visit.   Physical Exam Vitals signs reviewed.  Constitutional:      General: She is not in acute distress.    Appearance: Normal appearance. She is well-developed and normal weight. She is not ill-appearing.  HENT:     Head: Normocephalic and atraumatic.  Neck:     Musculoskeletal: Normal range of motion and neck supple.  Pulmonary:     Effort: Pulmonary effort is normal. No respiratory distress.  Neurological:     Mental Status: She is alert.  Psychiatric:        Mood and Affect: Mood normal.        Behavior: Behavior normal.        Thought Content: Thought content normal.        Judgment: Judgment normal.         Assessment & Plan    1. Difficulty sleeping Stable. Diagnosis pulled for medication refill. Continue current medical treatment plan. - clonazePAM (KLONOPIN) 1 MG tablet; TAKE 1 TABLET(1 MG) BY MOUTH TWICE DAILY AS NEEDED FOR ANXIETY  Dispense: 60 tablet; Refill: 5  2. Pancreatic insufficiency Stable. Diagnosis pulled for medication refill. Continue current medical treatment plan. - Pancrelipase, Lip-Prot-Amyl, (CREON) 24000-76000 units CPEP; Take 1 capsule (24,000 Units total) by mouth 3 (three) times daily with meals. 2 tablets with meals and 1 tablet with snacks  Dispense: 270 capsule; Refill: 1  3. Cough Has new cough present since March. Suspected allergies per patient. Was tested for Covid in March and reports negative. Bromfed DM given as below for cough. - brompheniramine-pseudoephedrine-DM 30-2-10 MG/5ML syrup; Take 5 mLs by mouth 4 (four) times daily as needed.  Dispense: 120 mL; Refill: 0    I discussed the assessment and treatment plan with the patient. The patient was provided an opportunity to ask questions and all were answered. The patient agreed with the plan and demonstrated an understanding of the instructions.   The patient was  advised to call back or seek an in-person evaluation if the symptoms worsen or if the condition fails to improve as anticipated.  I provided 25 minutes of non-face-to-face time during this encounter.   Mar Daring, PA-C  [facility]

## 2018-06-10 NOTE — Telephone Encounter (Signed)
Corrected

## 2018-06-11 MED ORDER — PANCRELIPASE (LIP-PROT-AMYL) 36000-114000 UNITS PO CPEP: 36000.0000 [IU] | ORAL_CAPSULE | Freq: Three times a day (TID) | ORAL | 1 refills | Status: DC

## 2018-06-18 ENCOUNTER — Encounter: Payer: Self-pay | Admitting: Physician Assistant

## 2018-06-18 DIAGNOSIS — J069 Acute upper respiratory infection, unspecified: Secondary | ICD-10-CM

## 2018-06-21 MED ORDER — AMOXICILLIN-POT CLAVULANATE 875-125 MG PO TABS: 1.0000 | ORAL_TABLET | Freq: Two times a day (BID) | ORAL | 0 refills | Status: DC

## 2018-07-12 ENCOUNTER — Encounter: Payer: Self-pay | Admitting: Physician Assistant

## 2018-07-13 ENCOUNTER — Ambulatory Visit (INDEPENDENT_AMBULATORY_CARE_PROVIDER_SITE_OTHER): Payer: BLUE CROSS/BLUE SHIELD | Admitting: Physician Assistant

## 2018-07-13 ENCOUNTER — Other Ambulatory Visit: Payer: PRIVATE HEALTH INSURANCE

## 2018-07-13 ENCOUNTER — Encounter: Payer: Self-pay | Admitting: Physician Assistant

## 2018-07-13 ENCOUNTER — Other Ambulatory Visit: Payer: Self-pay

## 2018-07-13 ENCOUNTER — Telehealth: Payer: Self-pay | Admitting: *Deleted

## 2018-07-13 VITALS — BP 118/83 | HR 99 | Temp 98.7°F | Resp 16 | Wt 128.0 lb

## 2018-07-13 DIAGNOSIS — Z20822 Contact with and (suspected) exposure to covid-19: Secondary | ICD-10-CM

## 2018-07-13 DIAGNOSIS — R05 Cough: Secondary | ICD-10-CM | POA: Diagnosis not present

## 2018-07-13 DIAGNOSIS — Z131 Encounter for screening for diabetes mellitus: Secondary | ICD-10-CM

## 2018-07-13 DIAGNOSIS — Z720 Tobacco use: Secondary | ICD-10-CM

## 2018-07-13 DIAGNOSIS — R300 Dysuria: Secondary | ICD-10-CM | POA: Diagnosis not present

## 2018-07-13 DIAGNOSIS — R059 Cough, unspecified: Secondary | ICD-10-CM

## 2018-07-13 MED ORDER — BUDESONIDE-FORMOTEROL FUMARATE 160-4.5 MCG/ACT IN AERO: 2.0000 | INHALATION_SPRAY | Freq: Two times a day (BID) | RESPIRATORY_TRACT | 3 refills | Status: DC

## 2018-07-13 MED ORDER — CEPHALEXIN 500 MG PO CAPS: 500.0000 mg | ORAL_CAPSULE | Freq: Two times a day (BID) | ORAL | 0 refills | Status: AC

## 2018-07-13 NOTE — Patient Instructions (Signed)
This information is directly available on the CDC website: https://www.cdc.gov/coronavirus/2019-ncov/if-you-are-sick/steps-when-sick.html    Source:CDC Reference to specific commercial products, manufacturers, companies, or trademarks does not constitute its endorsement or recommendation by the U.S. Government, Department of Health and Human Services, or Centers for Disease Control and Prevention.  

## 2018-07-13 NOTE — Telephone Encounter (Signed)
I scheduled her at the [facility] location for COVID testing 07/13/2018 at 2:30.

## 2018-07-13 NOTE — Progress Notes (Signed)
Patient: Veronica Lozano Female    DOB: 1966-01-05   53 y.o.   MRN: [id number] Visit Date: 07/13/2018  Today's Provider: Trinna Post, PA-C   Chief Complaint  Patient presents with  . Cough   Subjective:   Patient diabetes and chronic tobacco abuse with history of cough ongoing for a couple of months now. She has been seen several times for this including at outside ER where respiratory panel was negative but it does not seem COVID 19 testing was performed, urgent care for post viral cough and 06/10/2018 where she received cough syrup. She reports nonproductive cough. She reports she does have SOB. Denies wheezing. Cough medications and antibiotics did not help. She reports she went to [facility] this weekend and ate out at restaurants.  Cough  This is a recurrent problem. The current episode started 1 to 4 weeks ago. The problem has been unchanged. The cough is non-productive. Associated symptoms include chest pain, chills, ear congestion, headaches, myalgias, postnasal drip, a rash, rhinorrhea, a sore throat, shortness of breath and wheezing. Pertinent negatives include no ear pain or fever.   Patient also with c/o bad odor to urine. Denies fevers, chills, nausea, vomiting, pelvic pain.   Allergies  Allergen Reactions  . Latex Hives and Swelling  . Coconut Oil Hives  . Glipizide Hives  . Metformin Hives  . Pantoprazole     Other reaction(s): Vomiting  . Morphine And Related Itching and Rash     Current Outpatient Medications:  .  acetaminophen (TYLENOL) 325 MG tablet, Take 650 mg by mouth., Disp: , Rfl:  .  acyclovir (ZOVIRAX) 800 MG tablet, take 1 tablet by mouth once daily if needed, Disp: 90 tablet, Rfl: 3 .  amoxicillin-clavulanate (AUGMENTIN) 875-125 MG tablet, Take 1 tablet by mouth 2 (two) times daily., Disp: 10 tablet, Rfl: 0 .  atorvastatin (LIPITOR) 20 MG tablet, Take by mouth., Disp: , Rfl:  .  atorvastatin (LIPITOR) 80 MG tablet, Take 80 mg by  mouth daily., Disp: , Rfl:  .  Blood Glucose Monitoring Suppl (CONTOUR NEXT MONITOR) w/Device KIT, use to TEST BLOOD SUGAR, Disp: , Rfl: 0 .  Blood Glucose Monitoring Suppl (GLUCOCOM BLOOD GLUCOSE MONITOR) DEVI, Contour Next Link Blood Glucose Meter, Disp: , Rfl:  .  brompheniramine-pseudoephedrine-DM 30-2-10 MG/5ML syrup, Take 5 mLs by mouth 4 (four) times daily as needed., Disp: 120 mL, Rfl: 0 .  budesonide-formoterol (SYMBICORT) 160-4.5 MCG/ACT inhaler, Inhale 2 puffs into the lungs 2 (two) times daily., Disp: 1 Inhaler, Rfl: 3 .  butalbital-acetaminophen-caffeine (FIORICET, ESGIC) 50-325-40 MG tablet, Take 1 tablet by mouth every 6 (six) hours as needed for headache. (Patient not taking: Reported on 06/10/2018), Disp: 20 tablet, Rfl: 0 .  cephALEXin (KEFLEX) 500 MG capsule, Take 1 capsule (500 mg total) by mouth 2 (two) times daily for 5 days., Disp: 10 capsule, Rfl: 0 .  clonazePAM (KLONOPIN) 1 MG tablet, TAKE 1 TABLET(1 MG) BY MOUTH TWICE DAILY AS NEEDED FOR ANXIETY, Disp: 60 tablet, Rfl: 5 .  empagliflozin (JARDIANCE) 25 MG TABS tablet, Take by mouth., Disp: , Rfl:  .  folic acid (FOLVITE) 1 MG tablet, Take 1 mg by mouth daily., Disp: , Rfl:  .  insulin aspart (NOVOLOG) 100 UNIT/ML injection, Inject 2 Units 3 (three) times daily before meals into the skin. Sliding scale, Disp: , Rfl:  .  insulin glargine (LANTUS) 100 UNIT/ML injection, Inject 0.35 mLs (35 Units total) into the skin  at bedtime. (Patient taking differently: Inject 10 Units at bedtime into the skin. ), Disp: 10 mL, Rfl: 11 .  lipase/protease/amylase (CREON) 36000 UNITS CPEP capsule, Take 1 capsule (36,000 Units total) by mouth 3 (three) times daily with meals., Disp: 270 capsule, Rfl: 1 .  meclizine (ANTIVERT) 25 MG tablet, Take 1 tablet (25 mg total) by mouth 3 (three) times daily as needed for dizziness. (Patient not taking: Reported on 06/10/2018), Disp: 30 tablet, Rfl: 0 .  pregabalin (LYRICA) 75 MG capsule, Take 75 mg by mouth 1  day or 1 dose., Disp: , Rfl:  .  promethazine (PHENERGAN) 25 MG tablet, Take 25 mg by mouth every 6 (six) hours as needed for nausea or vomiting., Disp: , Rfl:  .  sulfamethoxazole-trimethoprim (BACTRIM DS,SEPTRA DS) 800-160 MG tablet, Take 1 tablet by mouth 2 (two) times daily. (Patient not taking: Reported on 06/10/2018), Disp: 20 tablet, Rfl: 0  Review of Systems  Constitutional: Positive for chills and fatigue. Negative for fever.  HENT: Positive for postnasal drip, rhinorrhea and sore throat. Negative for ear pain.   Respiratory: Positive for cough, chest tightness, shortness of breath and wheezing.   Cardiovascular: Positive for chest pain.  Genitourinary: Positive for hematuria.  Musculoskeletal: Positive for back pain and myalgias.  Skin: Positive for rash.  Neurological: Positive for headaches.    Social History   Tobacco Use  . Smoking status: Current Every Day Smoker    Packs/day: 0.25    Years: 25.00    Pack years: 6.25    Types: Cigarettes  . Smokeless tobacco: Never Used  Substance Use Topics  . Alcohol use: No    Alcohol/week: 0.0 standard drinks      Objective:   BP 118/83   Pulse 99   Temp 98.7 F (37.1 C) (Oral)   Resp 16   Wt 128 lb (58.1 kg)   BMI 27.22 kg/m  Vitals:   07/13/18 1335  BP: 118/83  Pulse: 99  Resp: 16  Temp: 98.7 F (37.1 C)  TempSrc: Oral  Weight: 128 lb (58.1 kg)     Physical Exam Constitutional:      Appearance: Normal appearance.  Cardiovascular:     Rate and Rhythm: Normal rate.  Pulmonary:     Effort: Pulmonary effort is normal. No respiratory distress.  Skin:    Coloration: Skin is not pale.     Findings: No rash.  Neurological:     Mental Status: She is alert.  Psychiatric:        Mood and Affect: Mood normal.        Behavior: Behavior normal.         Assessment & Plan    1. Cough  Suspect chronic cough secondary to COPD and will start Symbicort however patient does have cough and underlying condition  of diabetes. Will send for COVID testing.   - budesonide-formoterol (SYMBICORT) 160-4.5 MCG/ACT inhaler; Inhale 2 puffs into the lungs 2 (two) times daily.  Dispense: 1 Inhaler; Refill: 3  2. Diabetes mellitus screening   3. Dysuria  Patient complaining of dyrusia. Will not collect urine sample today as she is being sent for COVID testing and do not want to increase exposure. Will treat based on prior culture and if not better, after covid testing has returned may drop off urine sample.  - cephALEXin (KEFLEX) 500 MG capsule; Take 1 capsule (500 mg total) by mouth 2 (two) times daily for 5 days.  Dispense: 10 capsule; Refill: 0  4.  Tobacco abuse  - budesonide-formoterol (SYMBICORT) 160-4.5 MCG/ACT inhaler; Inhale 2 puffs into the lungs 2 (two) times daily.  Dispense: 1 Inhaler; Refill: 3  The entirety of the information documented in the History of Present Illness, Review of Systems and Physical Exam were personally obtained by me. Portions of this information were initially documented by Jennings Books, CMA and reviewed by me for thoroughness and accuracy.          Trinna Post, PA-C  [facility]

## 2018-07-14 ENCOUNTER — Encounter: Payer: Self-pay | Admitting: Physician Assistant

## 2018-07-14 ENCOUNTER — Telehealth: Payer: Self-pay | Admitting: Physician Assistant

## 2018-07-14 NOTE — Telephone Encounter (Signed)
Letter sent.

## 2018-07-15 LAB — NOVEL CORONAVIRUS, NAA: SARS-CoV-2, NAA: NOT DETECTED

## 2018-07-20 ENCOUNTER — Encounter: Payer: Self-pay | Admitting: Physician Assistant

## 2018-07-20 DIAGNOSIS — B379 Candidiasis, unspecified: Secondary | ICD-10-CM

## 2018-07-21 ENCOUNTER — Encounter: Payer: Self-pay | Admitting: Physician Assistant

## 2018-07-21 DIAGNOSIS — J209 Acute bronchitis, unspecified: Secondary | ICD-10-CM

## 2018-07-21 DIAGNOSIS — Z716 Tobacco abuse counseling: Secondary | ICD-10-CM

## 2018-07-21 DIAGNOSIS — R05 Cough: Secondary | ICD-10-CM

## 2018-07-21 DIAGNOSIS — R059 Cough, unspecified: Secondary | ICD-10-CM

## 2018-07-21 DIAGNOSIS — J440 Chronic obstructive pulmonary disease with acute lower respiratory infection: Secondary | ICD-10-CM

## 2018-07-21 MED ORDER — VARENICLINE TARTRATE 0.5 MG X 11 & 1 MG X 42 PO MISC: ORAL | 0 refills | Status: DC

## 2018-07-21 MED ORDER — FLUCONAZOLE 150 MG PO TABS: 150.0000 mg | ORAL_TABLET | Freq: Once | ORAL | 0 refills | Status: AC

## 2018-07-21 NOTE — Addendum Note (Signed)
Addended by: Margaretann Loveless on: 07/21/2018 05:13 PM   Modules accepted: Orders

## 2018-07-23 ENCOUNTER — Other Ambulatory Visit: Payer: Self-pay | Admitting: Physician Assistant

## 2018-07-23 DIAGNOSIS — R05 Cough: Secondary | ICD-10-CM

## 2018-07-23 DIAGNOSIS — R059 Cough, unspecified: Secondary | ICD-10-CM

## 2018-07-23 NOTE — Progress Notes (Signed)
CXR changed to [facility]

## 2018-08-06 ENCOUNTER — Other Ambulatory Visit: Payer: Self-pay | Admitting: Physician Assistant

## 2018-08-06 ENCOUNTER — Encounter: Payer: Self-pay | Admitting: Physician Assistant

## 2018-08-06 DIAGNOSIS — J189 Pneumonia, unspecified organism: Secondary | ICD-10-CM

## 2018-08-06 MED ORDER — LEVOFLOXACIN 500 MG PO TABS: 500.0000 mg | ORAL_TABLET | Freq: Every day | ORAL | 0 refills | Status: DC

## 2018-08-06 NOTE — Progress Notes (Signed)
Levaquin sent in for possible pneumonia seen on CXR

## 2018-08-09 ENCOUNTER — Encounter: Payer: Self-pay | Admitting: Physician Assistant

## 2018-08-09 DIAGNOSIS — B379 Candidiasis, unspecified: Secondary | ICD-10-CM

## 2018-08-09 MED ORDER — FLUCONAZOLE 150 MG PO TABS: 150.0000 mg | ORAL_TABLET | Freq: Once | ORAL | 1 refills | Status: AC

## 2018-08-12 ENCOUNTER — Encounter: Payer: Self-pay | Admitting: Physician Assistant

## 2018-08-13 ENCOUNTER — Encounter: Payer: Self-pay | Admitting: Physician Assistant

## 2018-08-13 ENCOUNTER — Ambulatory Visit: Payer: BLUE CROSS/BLUE SHIELD | Admitting: Physician Assistant

## 2018-08-13 ENCOUNTER — Other Ambulatory Visit: Payer: Self-pay

## 2018-08-13 VITALS — BP 116/72 | HR 105 | Temp 98.2°F | Wt 131.4 lb

## 2018-08-13 DIAGNOSIS — Z716 Tobacco abuse counseling: Secondary | ICD-10-CM | POA: Diagnosis not present

## 2018-08-13 DIAGNOSIS — K860 Alcohol-induced chronic pancreatitis: Secondary | ICD-10-CM | POA: Diagnosis not present

## 2018-08-13 DIAGNOSIS — R1013 Epigastric pain: Secondary | ICD-10-CM | POA: Diagnosis not present

## 2018-08-13 MED ORDER — VARENICLINE TARTRATE 1 MG PO TABS: 1.0000 mg | ORAL_TABLET | Freq: Two times a day (BID) | ORAL | 3 refills | Status: DC

## 2018-08-13 NOTE — Progress Notes (Signed)
Patient: Veronica Lozano Female    DOB: 11-29-1965   53 y.o.   MRN: [id number] Visit Date: 08/13/2018  Today's Provider: Mar Daring, PA-C   Chief Complaint  Patient presents with  . Abdominal Pain   Subjective:     Abdominal Pain The current episode started more than 1 month ago. The problem occurs intermittently. The problem has been unchanged. The quality of the pain is sharp and cramping. The abdominal pain radiates to the epigastric region. Pertinent negatives include no belching, constipation, diarrhea, nausea or vomiting. Nothing aggravates the pain.   Patient states this feels like her pancreatitis in the past or when she had the ileus of her duodenum after surgery. She reports there is no trigger. Food has not triggered it. Can come on at anytime, even when she is at rest lying down. She has not had any bowel changes, nausea or vomiting. She does have complicated history as she has had partial pancreaticoduodenectomy procedure for chronic pancreatitis in 2018. Has had cholecystectomy as well.    Allergies  Allergen Reactions  . Latex Hives and Swelling  . Coconut Oil Hives  . Glipizide Hives  . Metformin Hives  . Pantoprazole     Other reaction(s): Vomiting  . Morphine And Related Itching and Rash     Current Outpatient Medications:  .  acetaminophen (TYLENOL) 325 MG tablet, Take 650 mg by mouth., Disp: , Rfl:  .  acyclovir (ZOVIRAX) 800 MG tablet, take 1 tablet by mouth once daily if needed, Disp: 90 tablet, Rfl: 3 .  atorvastatin (LIPITOR) 20 MG tablet, Take by mouth., Disp: , Rfl:  .  atorvastatin (LIPITOR) 80 MG tablet, Take 80 mg by mouth daily., Disp: , Rfl:  .  Blood Glucose Monitoring Suppl (CONTOUR NEXT MONITOR) w/Device KIT, use to TEST BLOOD SUGAR, Disp: , Rfl: 0 .  Blood Glucose Monitoring Suppl (GLUCOCOM BLOOD GLUCOSE MONITOR) DEVI, Contour Next Link Blood Glucose Meter, Disp: , Rfl:  .  budesonide-formoterol (SYMBICORT) 160-4.5 MCG/ACT  inhaler, Inhale 2 puffs into the lungs 2 (two) times daily., Disp: 1 Inhaler, Rfl: 3 .  butalbital-acetaminophen-caffeine (FIORICET, ESGIC) 50-325-40 MG tablet, Take 1 tablet by mouth every 6 (six) hours as needed for headache., Disp: 20 tablet, Rfl: 0 .  clonazePAM (KLONOPIN) 1 MG tablet, TAKE 1 TABLET(1 MG) BY MOUTH TWICE DAILY AS NEEDED FOR ANXIETY, Disp: 60 tablet, Rfl: 5 .  empagliflozin (JARDIANCE) 25 MG TABS tablet, Take by mouth., Disp: , Rfl:  .  folic acid (FOLVITE) 1 MG tablet, Take 1 mg by mouth daily., Disp: , Rfl:  .  insulin aspart (NOVOLOG) 100 UNIT/ML injection, Inject 2 Units 3 (three) times daily before meals into the skin. Sliding scale, Disp: , Rfl:  .  insulin glargine (LANTUS) 100 UNIT/ML injection, Inject 0.35 mLs (35 Units total) into the skin at bedtime. (Patient taking differently: Inject 10 Units at bedtime into the skin. ), Disp: 10 mL, Rfl: 11 .  levofloxacin (LEVAQUIN) 500 MG tablet, Take 1 tablet (500 mg total) by mouth daily., Disp: 7 tablet, Rfl: 0 .  lipase/protease/amylase (CREON) 36000 UNITS CPEP capsule, Take 1 capsule (36,000 Units total) by mouth 3 (three) times daily with meals., Disp: 270 capsule, Rfl: 1 .  meclizine (ANTIVERT) 25 MG tablet, Take 1 tablet (25 mg total) by mouth 3 (three) times daily as needed for dizziness., Disp: 30 tablet, Rfl: 0 .  pregabalin (LYRICA) 75 MG capsule, Take 75 mg by mouth 1  day or 1 dose., Disp: , Rfl:  .  promethazine (PHENERGAN) 25 MG tablet, Take 25 mg by mouth every 6 (six) hours as needed for nausea or vomiting., Disp: , Rfl:  .  varenicline (CHANTIX STARTING MONTH PAK) 0.5 MG X 11 & 1 MG X 42 tablet, Take one 0.5 mg tablet by mouth once daily for 3 days, then increase to one 0.5 mg tablet twice daily for 4 days, then increase to one 1 mg tablet twice daily., Disp: 53 tablet, Rfl: 0  Review of Systems  Constitutional: Negative.   Respiratory: Negative.   Cardiovascular: Negative.   Gastrointestinal: Positive for  abdominal pain. Negative for constipation, diarrhea, nausea and vomiting.  Genitourinary: Negative.   Neurological: Negative.     Social History   Tobacco Use  . Smoking status: Current Every Day Smoker    Packs/day: 0.25    Years: 25.00    Pack years: 6.25    Types: Cigarettes  . Smokeless tobacco: Never Used  Substance Use Topics  . Alcohol use: No    Alcohol/week: 0.0 standard drinks      Objective:   BP 116/72 (BP Location: Left Arm, Patient Position: Sitting, Cuff Size: Normal)   Pulse (!) 105   Temp 98.2 F (36.8 C) (Oral)   Wt 131 lb 6.4 oz (59.6 kg)   SpO2 97%   BMI 27.94 kg/m  Vitals:   08/13/18 1007  BP: 116/72  Pulse: (!) 105  Temp: 98.2 F (36.8 C)  TempSrc: Oral  SpO2: 97%  Weight: 131 lb 6.4 oz (59.6 kg)     Physical Exam Vitals signs reviewed.  Constitutional:      General: She is not in acute distress.    Appearance: Normal appearance. She is well-developed and normal weight. She is not ill-appearing or diaphoretic.  Cardiovascular:     Rate and Rhythm: Normal rate and regular rhythm.     Heart sounds: Normal heart sounds. No murmur. No friction rub. No gallop.   Pulmonary:     Effort: Pulmonary effort is normal. No respiratory distress.     Breath sounds: Normal breath sounds. No wheezing or rales.  Abdominal:     General: Abdomen is flat. Bowel sounds are normal. There is no distension.     Palpations: Abdomen is soft. There is no mass.     Tenderness: There is abdominal tenderness in the epigastric area. There is no guarding or rebound.     Hernia: No hernia is present.    Skin:    General: Skin is warm and dry.  Neurological:     Mental Status: She is alert and oriented to person, place, and time.         Assessment & Plan    1. Encounter for smoking cessation counseling Has not completely quit yet, but starting to notice she does not crave them and they are starting to taste bad. Diagnosis pulled for medication refill.  Continue current medical treatment plan. - varenicline (CHANTIX CONTINUING MONTH PAK) 1 MG tablet; Take 1 tablet (1 mg total) by mouth 2 (two) times daily.  Dispense: 60 tablet; Refill: 3  2. Epigastric pain DDx: pancreatitis flare, duodenal obstruction/ileus, scar tissue, PUD (no food correlation though), hiatal hernia, ventral/incisional hernia (nothing palpable on exam). Will check labs as below and f/u pending results. CT ordered for better evaluation due to patient's surgical history. I will f/u pending results.  - CT Abdomen Pelvis W Contrast; Future - CBC w/Diff/Platelet - Comprehensive Metabolic  Panel (CMET) - Lipase  3. 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 Has not had issues since being released from hospital following surgery until now.  - CBC w/Diff/Platelet - Comprehensive Metabolic Panel (CMET) - Lipase     Mar Daring, PA-C  [facility]

## 2018-08-14 LAB — COMPREHENSIVE METABOLIC PANEL
ALT: 12 IU/L (ref 0–32)
AST: 13 IU/L (ref 0–40)
Albumin/Globulin Ratio: 1.8 (ref 1.2–2.2)
Albumin: 4.2 g/dL (ref 3.8–4.9)
Alkaline Phosphatase: 84 IU/L (ref 39–117)
BUN/Creatinine Ratio: 23 (ref 9–23)
BUN: 14 mg/dL (ref 6–24)
Bilirubin Total: 0.4 mg/dL (ref 0.0–1.2)
CO2: 21 mmol/L (ref 20–29)
Calcium: 9.1 mg/dL (ref 8.7–10.2)
Chloride: 101 mmol/L (ref 96–106)
Creatinine, Ser: 0.61 mg/dL (ref 0.57–1.00)
GFR calc Af Amer: 121 mL/min/{1.73_m2} (ref >59)
GFR calc non Af Amer: 105 mL/min/{1.73_m2} (ref >59)
Globulin, Total: 2.3 g/dL (ref 1.5–4.5)
Glucose: 251 mg/dL — ABNORMAL HIGH (ref 65–99)
Potassium: 4.5 mmol/L (ref 3.5–5.2)
Sodium: 139 mmol/L (ref 134–144)
Total Protein: 6.5 g/dL (ref 6.0–8.5)

## 2018-08-14 LAB — CBC WITH DIFFERENTIAL/PLATELET
Basophils Absolute: 0 10*3/uL (ref 0.0–0.2)
Basos: 1 %
EOS (ABSOLUTE): 0.1 10*3/uL (ref 0.0–0.4)
Eos: 1 %
Hematocrit: 48.1 % — ABNORMAL HIGH (ref 34.0–46.6)
Hemoglobin: 16.3 g/dL — ABNORMAL HIGH (ref 11.1–15.9)
Immature Grans (Abs): 0 10*3/uL (ref 0.0–0.1)
Immature Granulocytes: 0 %
Lymphocytes Absolute: 2.4 10*3/uL (ref 0.7–3.1)
Lymphs: 32 %
MCH: 29.4 pg (ref 26.6–33.0)
MCHC: 33.9 g/dL (ref 31.5–35.7)
MCV: 87 fL (ref 79–97)
Monocytes Absolute: 0.4 10*3/uL (ref 0.1–0.9)
Monocytes: 5 %
Neutrophils Absolute: 4.7 10*3/uL (ref 1.4–7.0)
Neutrophils: 61 %
Platelets: 253 10*3/uL (ref 150–450)
RBC: 5.55 x10E6/uL — ABNORMAL HIGH (ref 3.77–5.28)
RDW: 12.4 % (ref 11.7–15.4)
WBC: 7.7 10*3/uL (ref 3.4–10.8)

## 2018-08-14 LAB — LIPASE: Lipase: 11 U/L — ABNORMAL LOW (ref 14–72)

## 2018-08-16 ENCOUNTER — Encounter: Payer: Self-pay | Admitting: Physician Assistant

## 2018-08-17 ENCOUNTER — Telehealth: Payer: Self-pay | Admitting: Physician Assistant

## 2018-08-17 DIAGNOSIS — R1013 Epigastric pain: Secondary | ICD-10-CM

## 2018-08-17 DIAGNOSIS — K860 Alcohol-induced chronic pancreatitis: Secondary | ICD-10-CM

## 2018-08-17 DIAGNOSIS — Z90411 Acquired partial absence of pancreas: Secondary | ICD-10-CM

## 2018-08-17 NOTE — Telephone Encounter (Signed)
Please advise 

## 2018-08-17 NOTE — Telephone Encounter (Signed)
Insurance company would like to speak peer to peer to get CT approved Phone # [phone].Member # [id number]

## 2018-08-19 NOTE — Telephone Encounter (Signed)
Order changed.

## 2018-08-19 NOTE — Telephone Encounter (Signed)
CT abd only approved; cancel pelvis (I do not need pelvic portion as I am looking at her pancreas). Do I need to change the order since this one was approved?  Approved [id number] valid 6/23-12/3/20

## 2018-08-19 NOTE — Telephone Encounter (Signed)
Yes,will need new order

## 2018-08-30 ENCOUNTER — Encounter: Payer: Self-pay | Admitting: Physician Assistant

## 2018-08-30 NOTE — Telephone Encounter (Signed)
Per [facility] order will need to be changed to abdominal with contrast,Thanks. They will not do w/wo

## 2018-08-31 ENCOUNTER — Encounter: Payer: Self-pay | Admitting: Physician Assistant

## 2018-08-31 NOTE — Addendum Note (Signed)
Addended by: Mar Daring on: 08/31/2018 03:13 PM   Modules accepted: Orders

## 2018-08-31 NOTE — Addendum Note (Signed)
Addended by: Mar Daring on: 08/31/2018 01:03 PM   Modules accepted: Orders

## 2018-08-31 NOTE — Telephone Encounter (Signed)
Order changed.

## 2018-08-31 NOTE — Telephone Encounter (Signed)
Changed again 

## 2018-08-31 NOTE — Telephone Encounter (Signed)
Can you change order again ? Insurance company did not approve CT of pelvis, Thanks

## 2018-09-01 ENCOUNTER — Telehealth: Payer: Self-pay

## 2018-09-01 NOTE — Telephone Encounter (Signed)
Patient states she called [facility] radiology and they do not have any information on her for her scan.  She is upset that it is taking so long to get this procedure done.  She would like a status update on this.  CB # [phone]

## 2018-09-01 NOTE — Telephone Encounter (Signed)
Jasmine Pang do you have any update on this?

## 2018-09-06 ENCOUNTER — Encounter: Payer: Self-pay | Admitting: Physician Assistant

## 2018-09-06 ENCOUNTER — Ambulatory Visit: Payer: BLUE CROSS/BLUE SHIELD

## 2018-09-06 DIAGNOSIS — Z8701 Personal history of pneumonia (recurrent): Secondary | ICD-10-CM

## 2018-09-07 ENCOUNTER — Telehealth: Payer: Self-pay

## 2018-09-07 NOTE — Telephone Encounter (Signed)
Patient was advised of CT results-Normal  Per Tawanna Sat Start Omeprazole 20mg  BID OTC call if no improvement in 2 weeks. will scan report.  Per patient she is going to be due for a repeat chest xray? That they want her to have repeat in 4 weeks. There's a Pharmacist, community message also

## 2018-09-26 ENCOUNTER — Encounter: Payer: Self-pay | Admitting: Physician Assistant

## 2018-11-26 ENCOUNTER — Other Ambulatory Visit: Payer: Self-pay | Admitting: Physician Assistant

## 2018-11-26 DIAGNOSIS — K860 Alcohol-induced chronic pancreatitis: Secondary | ICD-10-CM

## 2018-11-26 DIAGNOSIS — K8689 Other specified diseases of pancreas: Secondary | ICD-10-CM

## 2018-11-29 ENCOUNTER — Encounter: Payer: Self-pay | Admitting: Physician Assistant

## 2018-12-23 ENCOUNTER — Other Ambulatory Visit: Payer: Self-pay | Admitting: Physician Assistant

## 2018-12-23 DIAGNOSIS — G479 Sleep disorder, unspecified: Secondary | ICD-10-CM

## 2018-12-24 NOTE — Telephone Encounter (Signed)
1 month supply will be sent to the pharmacy, but she is due for follow-up appointment and will need to schedule this before further refills are given

## 2018-12-24 NOTE — Telephone Encounter (Signed)
lmtcb and schedule f/u

## 2019-01-06 NOTE — Progress Notes (Signed)
Patient: Veronica Lozano Female    DOB: 1966-01-09   53 y.o.   MRN: [id number] Visit Date: 01/07/2019  Today's Provider: Mar Daring, PA-C   Chief Complaint  Patient presents with  . Insomnia   Subjective:    I,Veronica Lozano,RMA am acting as a Education administrator for Mar Daring, PA-C.  HPI  Patient here today to follow-up on medications. Patient needs refill on the clonazepam. Reports that she stable on this medicine and is able to sleep better.   Patient reports that she is having some burning with urination and some vaginal discharge. The color is white. She reports some odor to her urine. Suspected to be from [place] and her having elevated sugars recently with her elevated pancreatic pains.   Patient declined influenza vaccine.  Allergies  Allergen Reactions  . Latex Hives and Swelling  . Coconut Oil Hives  . Glipizide Hives  . Metformin Hives  . Pantoprazole     Other reaction(s): Vomiting  . Morphine And Related Itching and Rash     Current Outpatient Medications:  .  acetaminophen (TYLENOL) 325 MG tablet, Take 650 mg by mouth., Disp: , Rfl:  .  acyclovir (ZOVIRAX) 800 MG tablet, take 1 tablet by mouth once daily if needed, Disp: 90 tablet, Rfl: 3 .  atorvastatin (LIPITOR) 20 MG tablet, Take by mouth., Disp: , Rfl:  .  Blood Glucose Monitoring Suppl (CONTOUR NEXT MONITOR) w/Device KIT, use to TEST BLOOD SUGAR, Disp: , Rfl: 0 .  Blood Glucose Monitoring Suppl (GLUCOCOM BLOOD GLUCOSE MONITOR) DEVI, Contour Next Link Blood Glucose Meter, Disp: , Rfl:  .  budesonide-formoterol (SYMBICORT) 160-4.5 MCG/ACT inhaler, Inhale 2 puffs into the lungs 2 (two) times daily., Disp: 1 Inhaler, Rfl: 3 .  clonazePAM (KLONOPIN) 1 MG tablet, TAKE 1 TABLET(1 MG) BY MOUTH TWICE DAILY AS NEEDED FOR ANXIETY, Disp: 60 tablet, Rfl: 0 .  CREON 36000 units CPEP capsule, TAKE 1 CAPSULE BY MOUTH THREE TIMES DAILY WITH MEALS, Disp: 270 capsule, Rfl: 1 .  empagliflozin (JARDIANCE)  25 MG TABS tablet, Take by mouth., Disp: , Rfl:  .  folic acid (FOLVITE) 1 MG tablet, Take 1 mg by mouth daily., Disp: , Rfl:  .  insulin aspart (NOVOLOG) 100 UNIT/ML injection, Inject 2 Units 3 (three) times daily before meals into the skin. Sliding scale, Disp: , Rfl:  .  promethazine (PHENERGAN) 25 MG tablet, Take 25 mg by mouth every 6 (six) hours as needed for nausea or vomiting., Disp: , Rfl:  .  sucralfate (CARAFATE) 1 g tablet, Take by mouth., Disp: , Rfl:  .  insulin glargine (LANTUS) 100 UNIT/ML injection, Inject 0.35 mLs (35 Units total) into the skin at bedtime. (Patient not taking: Reported on 01/07/2019), Disp: 10 mL, Rfl: 11 .  meclizine (ANTIVERT) 25 MG tablet, Take 1 tablet (25 mg total) by mouth 3 (three) times daily as needed for dizziness. (Patient not taking: Reported on 01/07/2019), Disp: 30 tablet, Rfl: 0 .  pregabalin (LYRICA) 75 MG capsule, Take 75 mg by mouth 1 day or 1 dose., Disp: , Rfl:  .  traMADol (ULTRAM) 50 MG tablet, TK 1 - 2 TS PO UP TO QID PRN P FLARE FROM PANCREATITIS, Disp: , Rfl:  .  varenicline (CHANTIX CONTINUING MONTH PAK) 1 MG tablet, Take 1 tablet (1 mg total) by mouth 2 (two) times daily. (Patient not taking: Reported on 01/07/2019), Disp: 60 tablet, Rfl: 3  Review of Systems  Constitutional:  Negative.   Respiratory: Negative.   Cardiovascular: Negative.   Gastrointestinal: Negative for abdominal pain (none today).  Genitourinary: Positive for dysuria, frequency and vaginal discharge. Negative for enuresis, flank pain, vaginal bleeding and vaginal pain.  Musculoskeletal: Negative for back pain.  Psychiatric/Behavioral: Positive for sleep disturbance. The patient is nervous/anxious.     Social History   Tobacco Use  . Smoking status: Current Every Day Smoker    Packs/day: 0.25    Years: 25.00    Pack years: 6.25    Types: Cigarettes  . Smokeless tobacco: Never Used  Substance Use Topics  . Alcohol use: No    Alcohol/week: 0.0 standard drinks       Objective:   BP 117/82 (BP Location: Left Arm, Patient Position: Sitting, Cuff Size: Normal)   Pulse 83   Temp (!) 97.5 F (36.4 C) (Temporal)   Resp 16   Wt 123 lb (55.8 kg)   BMI 26.16 kg/m  Vitals:   01/07/19 1345  BP: 117/82  Pulse: 83  Resp: 16  Temp: (!) 97.5 F (36.4 C)  TempSrc: Temporal  Weight: 123 lb (55.8 kg)  Body mass index is 26.16 kg/m.   Physical Exam Vitals signs reviewed.  Constitutional:      General: She is not in acute distress.    Appearance: Normal appearance. She is well-developed and normal weight. She is not ill-appearing or diaphoretic.  Neck:     Musculoskeletal: Normal range of motion and neck supple.  Cardiovascular:     Rate and Rhythm: Normal rate and regular rhythm.     Pulses: Normal pulses.     Heart sounds: Normal heart sounds. No murmur. No friction rub. No gallop.   Pulmonary:     Effort: Pulmonary effort is normal. No respiratory distress.     Breath sounds: Normal breath sounds. No wheezing or rales.  Neurological:     Mental Status: She is alert.      Results for orders placed or performed in visit on 01/07/19  POCT urinalysis dipstick  Result Value Ref Range   Color, UA Light Yellow    Clarity, UA Clear    Glucose, UA Positive (A) Negative   Bilirubin, UA Negative    Ketones, UA Negative    Spec Grav, UA 1.010 1.010 - 1.025   Blood, UA Trace    pH, UA 6.0 5.0 - 8.0   Protein, UA Negative Negative   Urobilinogen, UA 0.2 0.2 or 1.0 E.U./dL   Nitrite, UA Negative    Leukocytes, UA Negative Negative   Appearance     Odor         Assessment & Plan    1. Burning with urination UA positive for glucosuria only. Will send in diflucan for yeast infection from [place].   2. Infection due to yeast See above medical treatment plan. - fluconazole (DIFLUCAN) 150 MG tablet; Take 1 tablet (150 mg total) by mouth once for 1 dose. May repeat in 48-72 hrs if needed  Dispense: 2 tablet; Refill: 0  3. Primary  insomnia Stable on Clonazepam 62m at bedtime.      JMar Daring PA-C  [facility]

## 2019-01-07 ENCOUNTER — Ambulatory Visit (INDEPENDENT_AMBULATORY_CARE_PROVIDER_SITE_OTHER): Payer: BLUE CROSS/BLUE SHIELD | Admitting: Physician Assistant

## 2019-01-07 ENCOUNTER — Encounter: Payer: Self-pay | Admitting: Physician Assistant

## 2019-01-07 ENCOUNTER — Other Ambulatory Visit: Payer: Self-pay

## 2019-01-07 VITALS — BP 117/82 | HR 83 | Temp 97.5°F | Resp 16 | Wt 123.0 lb

## 2019-01-07 DIAGNOSIS — F5101 Primary insomnia: Secondary | ICD-10-CM

## 2019-01-07 DIAGNOSIS — R300 Dysuria: Secondary | ICD-10-CM

## 2019-01-07 DIAGNOSIS — B379 Candidiasis, unspecified: Secondary | ICD-10-CM | POA: Diagnosis not present

## 2019-01-07 LAB — POCT URINALYSIS DIPSTICK
Bilirubin, UA: NEGATIVE
Glucose, UA: POSITIVE — AB
Ketones, UA: NEGATIVE
Leukocytes, UA: NEGATIVE
Nitrite, UA: NEGATIVE
Protein, UA: NEGATIVE
Spec Grav, UA: 1.01 (ref 1.010–1.025)
Urobilinogen, UA: 0.2 E.U./dL
pH, UA: 6 (ref 5.0–8.0)

## 2019-01-07 MED ORDER — FLUCONAZOLE 150 MG PO TABS: 150.0000 mg | ORAL_TABLET | Freq: Once | ORAL | 0 refills | Status: AC

## 2019-01-10 ENCOUNTER — Other Ambulatory Visit: Payer: Self-pay

## 2019-01-10 ENCOUNTER — Encounter: Payer: Self-pay | Admitting: Physician Assistant

## 2019-01-10 ENCOUNTER — Ambulatory Visit
Admission: EM | Admit: 2019-01-10 | Discharge: 2019-01-10 | Disposition: A | Discharge status: Home / Self Care | Payer: BLUE CROSS/BLUE SHIELD | Attending: Family Medicine | Admitting: Family Medicine

## 2019-01-10 DIAGNOSIS — M5442 Lumbago with sciatica, left side: Secondary | ICD-10-CM | POA: Diagnosis not present

## 2019-01-10 MED ORDER — CYCLOBENZAPRINE HCL 10 MG PO TABS: 10.0000 mg | ORAL_TABLET | Freq: Two times a day (BID) | ORAL | 0 refills | Status: DC | PRN

## 2019-01-10 MED ORDER — MELOXICAM 15 MG PO TABS: 15.0000 mg | ORAL_TABLET | Freq: Every day | ORAL | 0 refills | Status: DC | PRN

## 2019-01-10 NOTE — Discharge Instructions (Addendum)
Take medication as prescribed. Rest. Drink plenty of fluids. Ice.  ° °Follow up with your primary care physician this week as needed. Return to Urgent care for new or worsening concerns.  ° °

## 2019-01-10 NOTE — ED Provider Notes (Signed)
[facility] ____________________________________________  Time seen: Approximately 12:22 PM  I have reviewed the triage vital signs and the nursing notes.   HISTORY  Chief Complaint Back Pain   HPI Veronica Lozano is a 53 y.o. female history of pancreatitis, hypertension and diabetes presenting for evaluation of low back pain.  Patient reports history of similar several years ago with a disc issue in her low back.  However reports this time she is having pain in the left lower back as well that intermittently radiates down left leg.  States pain is mostly with movement.  Improves with being still.  Has taken some over-the-counter Tylenol and ibuprofen without resolution.  Also took some tramadol without resolution, did help with pain.  Denies loss of sensation, urinary bowel retention or incontinence, abdominal pain, fevers, fall, direct injury.  Denies recent sickness.  Reports woke up with pain.  Denies other aggravating or alleviating factors.  Mar Daring, PA-C : PCP   Past Medical History:  Diagnosis Date  . Diabetes mellitus without complication ([place])   . Heartburn   . HLD (hyperlipidemia)   . Hypertension   . Pancreatitis     Patient Active Problem List   Diagnosis Date Noted  . Smoker 10/12/2017  . Chest pain with high risk for cardiac etiology 11/05/2015  . Tobacco dependence 10/17/2015  . Hyperlipidemia, mixed 10/17/2015  . Microscopic hematuria 04/02/2015  . Nocturia 04/02/2015  . Long term current use of opiate analgesic 03/21/2015  . Encounter for therapeutic drug level monitoring 03/21/2015  . Chronic abdominal pain (epigastric) (Location of Primary Source of Pain) 03/21/2015  . Encounter for pain management planning 03/21/2015  . Chronic pain 03/21/2015  . Pain management 03/21/2015  . Blood pressure elevated 03/19/2015  . Chronic recurrent pancreatitis ([place]) 03/19/2015  . T2DM (type 2 diabetes mellitus) ([place]) 03/19/2015  . Insomnia,  persistent 10/30/2014  . History of shingles 10/25/2014    Past Surgical History:  Procedure Laterality Date  . ABDOMINAL HYSTERECTOMY    . APPENDECTOMY    . CARDIAC CATHETERIZATION    . CARPAL TUNNEL RELEASE Bilateral 1998  . CHOLECYSTECTOMY    . FOOT SURGERY Bilateral    for neuropathy     No current facility-administered medications for this encounter.   Current Outpatient Medications:  .  acetaminophen (TYLENOL) 325 MG tablet, Take 650 mg by mouth., Disp: , Rfl:  .  acyclovir (ZOVIRAX) 800 MG tablet, take 1 tablet by mouth once daily if needed, Disp: 90 tablet, Rfl: 3 .  atorvastatin (LIPITOR) 20 MG tablet, Take by mouth., Disp: , Rfl:  .  Blood Glucose Monitoring Suppl (CONTOUR NEXT MONITOR) w/Device KIT, use to TEST BLOOD SUGAR, Disp: , Rfl: 0 .  Blood Glucose Monitoring Suppl (GLUCOCOM BLOOD GLUCOSE MONITOR) DEVI, Contour Next Link Blood Glucose Meter, Disp: , Rfl:  .  budesonide-formoterol (SYMBICORT) 160-4.5 MCG/ACT inhaler, Inhale 2 puffs into the lungs 2 (two) times daily., Disp: 1 Inhaler, Rfl: 3 .  clonazePAM (KLONOPIN) 1 MG tablet, TAKE 1 TABLET(1 MG) BY MOUTH TWICE DAILY AS NEEDED FOR ANXIETY, Disp: 60 tablet, Rfl: 0 .  CREON 36000 units CPEP capsule, TAKE 1 CAPSULE BY MOUTH THREE TIMES DAILY WITH MEALS, Disp: 270 capsule, Rfl: 1 .  cyclobenzaprine (FLEXERIL) 10 MG tablet, Take 1 tablet (10 mg total) by mouth 2 (two) times daily as needed for muscle spasms. Do not drive while taking as can cause drowsiness, Disp: 15 tablet, Rfl: 0 .  empagliflozin (JARDIANCE) 25 MG TABS  tablet, Take by mouth., Disp: , Rfl:  .  folic acid (FOLVITE) 1 MG tablet, Take 1 mg by mouth daily., Disp: , Rfl:  .  insulin aspart (NOVOLOG) 100 UNIT/ML injection, Inject 2 Units 3 (three) times daily before meals into the skin. Sliding scale, Disp: , Rfl:  .  insulin glargine (LANTUS) 100 UNIT/ML injection, Inject 0.35 mLs (35 Units total) into the skin at bedtime. (Patient not taking: Reported on  01/07/2019), Disp: 10 mL, Rfl: 11 .  meclizine (ANTIVERT) 25 MG tablet, Take 1 tablet (25 mg total) by mouth 3 (three) times daily as needed for dizziness. (Patient not taking: Reported on 01/07/2019), Disp: 30 tablet, Rfl: 0 .  meloxicam (MOBIC) 15 MG tablet, Take 1 tablet (15 mg total) by mouth daily as needed., Disp: 10 tablet, Rfl: 0 .  pregabalin (LYRICA) 75 MG capsule, Take 75 mg by mouth 1 day or 1 dose., Disp: , Rfl:  .  promethazine (PHENERGAN) 25 MG tablet, Take 25 mg by mouth every 6 (six) hours as needed for nausea or vomiting., Disp: , Rfl:  .  sucralfate (CARAFATE) 1 g tablet, Take by mouth., Disp: , Rfl:  .  traMADol (ULTRAM) 50 MG tablet, TK 1 - 2 TS PO UP TO QID PRN P FLARE FROM PANCREATITIS, Disp: , Rfl:  .  varenicline (CHANTIX CONTINUING MONTH PAK) 1 MG tablet, Take 1 tablet (1 mg total) by mouth 2 (two) times daily. (Patient not taking: Reported on 01/07/2019), Disp: 60 tablet, Rfl: 3  Allergies Latex, Coconut oil, Glipizide, Metformin, Pantoprazole, and Morphine and related  Family History  Problem Relation Age of Onset  . Cancer Father        Lymphoma  . Kidney cancer Father   . Cancer Mother        lung  . Diabetes Brother   . Cancer Brother   . Breast cancer Sister   . Bladder Cancer Neg Hx   . Prostate cancer Neg Hx     Social History Social History   Tobacco Use  . Smoking status: Current Every Day Smoker    Packs/day: 0.25    Years: 25.00    Pack years: 6.25    Types: Cigarettes  . Smokeless tobacco: Never Used  Substance Use Topics  . Alcohol use: No    Alcohol/week: 0.0 standard drinks  . Drug use: No    Review of Systems Constitutional: No fever ENT: No sore throat. Cardiovascular: Denies chest pain. Respiratory: Denies shortness of breath. Gastrointestinal:  No nausea, no vomiting.  No diarrhea.  No constipation. Genitourinary: Negative for dysuria. Musculoskeletal: Positive for back pain. Skin: Negative for rash. Neurological:  Negative for focal weakness or numbness.   ____________________________________________   PHYSICAL EXAM:  VITAL SIGNS: ED Triage Vitals  Enc Vitals Group     BP 01/10/19 1116 120/84     Pulse Rate 01/10/19 1116 89     Resp 01/10/19 1116 15     Temp 01/10/19 1116 98.6 F (37 C)     Temp Source 01/10/19 1116 Oral     SpO2 01/10/19 1116 98 %     Weight 01/10/19 1112 126 lb (57.2 kg)     Height --      Head Circumference --      Peak Flow --      Pain Score 01/10/19 1112 10     Pain Loc --      Pain Edu? --      Excl. in [place]? --  Constitutional: Alert and oriented. Well appearing and in no acute distress. Eyes: Conjunctivae are normal.  ENT      Head: Normocephalic and atraumatic. Cardiovascular: Normal rate, regular rhythm. Grossly normal heart sounds.  Good peripheral circulation. Respiratory: Normal respiratory effort without tachypnea nor retractions. Breath sounds are clear and equal bilaterally. No wheezes, rales, rhonchi. Gastrointestinal: Soft and nontender.No CVA tenderness. Musculoskeletal:  No midline cervical, thoracic or lumbar tenderness to palpation. Bilateral pedal pulses equal and easily palpated.   Except: Left lower paralumbar and over sciatic notch tenderness to direct palpation, ambulatory with steady gait, changes positions quickly in room, no saddle anesthesia, bilateral plantar flexion and dorsiflexion strong and equal, pain with lumbar flexion and extension, minimal pain with right and left lumbar rotation, no pain with standing bilateral knee lifts.  Neurologic:  Normal speech and language. No gross focal neurologic deficits are appreciated. Speech is normal. No gait instability.  Skin:  Skin is warm, dry and intact. No rash noted. Psychiatric: Mood and affect are normal. Speech and behavior are normal. Patient exhibits appropriate insight and judgment   ___________________________________________   LABS (all labs ordered are listed, but only  abnormal results are displayed)  Labs Reviewed - No data to display   PROCEDURES Procedures     INITIAL IMPRESSION / ASSESSMENT AND PLAN / ED COURSE  Pertinent labs & imaging results that were available during my care of the patient were reviewed by me and considered in my medical decision making (see chart for details).  Well-appearing patient.  No acute distress.  Lower back pain with sciatica.  No point bony tenderness.  No trauma.  Will treat with oral Mobic and as needed Flexeril.  Encourage rest, fluids, supportive care.Discussed indication, risks and benefits of medications with patient.   Discussed follow up with Primary care physician this week. Discussed follow up and return parameters including no resolution or any worsening concerns. Patient verbalized understanding and agreed to plan.   ____________________________________________   FINAL CLINICAL IMPRESSION(S) / ED DIAGNOSES  Final diagnoses:  Acute left-sided low back pain with left-sided sciatica     ED Discharge Orders         Ordered    meloxicam (MOBIC) 15 MG tablet  Daily PRN     01/10/19 1201    cyclobenzaprine (FLEXERIL) 10 MG tablet  2 times daily PRN     01/10/19 1201           Note: This dictation was prepared with Dragon dictation along with smaller phrase technology. Any transcriptional errors that result from this process are unintentional.         Marylene Land, NP 01/10/19 1229

## 2019-01-10 NOTE — ED Triage Notes (Signed)
Pt. States she has had back pain since Saturday morning, she thinks her disk in her back has flared up.

## 2019-01-17 ENCOUNTER — Encounter: Payer: Self-pay | Admitting: Physician Assistant

## 2019-01-25 ENCOUNTER — Ambulatory Visit: Payer: Self-pay | Admitting: *Deleted

## 2019-01-25 NOTE — Telephone Encounter (Signed)
From [facility] 

## 2019-01-25 NOTE — Progress Notes (Signed)
Patient: Veronica Lozano Female    DOB: January 05, 1966   53 y.o.   MRN: [id number] Visit Date: 01/26/2019  Today's Provider: Mar Daring, PA-C   Chief Complaint  Patient presents with  . Abdominal Pain   Subjective:     Abdominal Pain This is a recurrent problem. Episode onset: 4 days ago. The onset quality is sudden. The problem occurs constantly. The problem has been unchanged. The pain is located in the generalized abdominal region. Quality: stabbing pain. The abdominal pain radiates to the back. Associated symptoms include nausea. Pertinent negatives include no constipation, diarrhea, fever, frequency, headaches, hematuria or vomiting. The pain is aggravated by eating. Relieved by: heating pad and Ibuprofen. Treatments tried: heat and NSAIDS. The treatment provided mild relief.    Allergies  Allergen Reactions  . Latex Hives and Swelling  . Coconut Oil Hives  . Glipizide Hives  . Metformin Hives  . Pantoprazole     Other reaction(s): Vomiting  . Morphine And Related Itching and Rash     Current Outpatient Medications:  .  acetaminophen (TYLENOL) 325 MG tablet, Take 650 mg by mouth., Disp: , Rfl:  .  acyclovir (ZOVIRAX) 800 MG tablet, take 1 tablet by mouth once daily if needed, Disp: 90 tablet, Rfl: 3 .  atorvastatin (LIPITOR) 20 MG tablet, Take by mouth., Disp: , Rfl:  .  Blood Glucose Monitoring Suppl (CONTOUR NEXT MONITOR) w/Device KIT, use to TEST BLOOD SUGAR, Disp: , Rfl: 0 .  Blood Glucose Monitoring Suppl (GLUCOCOM BLOOD GLUCOSE MONITOR) DEVI, Contour Next Link Blood Glucose Meter, Disp: , Rfl:  .  budesonide-formoterol (SYMBICORT) 160-4.5 MCG/ACT inhaler, Inhale 2 puffs into the lungs 2 (two) times daily., Disp: 1 Inhaler, Rfl: 3 .  clonazePAM (KLONOPIN) 1 MG tablet, TAKE 1 TABLET(1 MG) BY MOUTH TWICE DAILY AS NEEDED FOR ANXIETY, Disp: 60 tablet, Rfl: 0 .  CREON 36000 units CPEP capsule, TAKE 1 CAPSULE BY MOUTH THREE TIMES DAILY WITH MEALS, Disp: 270  capsule, Rfl: 1 .  empagliflozin (JARDIANCE) 25 MG TABS tablet, Take by mouth., Disp: , Rfl:  .  folic acid (FOLVITE) 1 MG tablet, Take 1 mg by mouth daily., Disp: , Rfl:  .  insulin aspart (NOVOLOG) 100 UNIT/ML injection, Inject 2 Units 3 (three) times daily before meals into the skin. Sliding scale, Disp: , Rfl:  .  insulin glargine (LANTUS) 100 UNIT/ML injection, Inject 0.35 mLs (35 Units total) into the skin at bedtime., Disp: 10 mL, Rfl: 11 .  promethazine (PHENERGAN) 25 MG tablet, Take 25 mg by mouth every 6 (six) hours as needed for nausea or vomiting., Disp: , Rfl:  .  sucralfate (CARAFATE) 1 g tablet, Take by mouth., Disp: , Rfl:   Review of Systems  Constitutional: Negative.  Negative for fever.  Respiratory: Negative.   Cardiovascular: Negative.   Gastrointestinal: Positive for abdominal pain and nausea. Negative for constipation, diarrhea and vomiting.  Genitourinary: Negative for frequency and hematuria.  Musculoskeletal: Negative.   Neurological: Negative for headaches.    Social History   Tobacco Use  . Smoking status: Current Every Day Smoker    Packs/day: 0.25    Years: 25.00    Pack years: 6.25    Types: Cigarettes  . Smokeless tobacco: Never Used  Substance Use Topics  . Alcohol use: No    Alcohol/week: 0.0 standard drinks      Objective:   BP 105/73 (BP Location: Right Arm, Patient Position: Sitting, Cuff Size: Normal)  Pulse 99   Temp (!) 96.8 F (36 C) (Temporal)   Wt 124 lb 9.6 oz (56.5 kg)   BMI 26.50 kg/m  Vitals:   01/26/19 1024  BP: 105/73  Pulse: 99  Temp: (!) 96.8 F (36 C)  TempSrc: Temporal  Weight: 124 lb 9.6 oz (56.5 kg)  Body mass index is 26.5 kg/m.   Physical Exam Constitutional:      General: She is not in acute distress.    Appearance: Normal appearance. She is well-developed. She is not diaphoretic.  Cardiovascular:     Rate and Rhythm: Normal rate and regular rhythm.     Heart sounds: Normal heart sounds. No murmur.  No friction rub. No gallop.   Pulmonary:     Effort: Pulmonary effort is normal. No respiratory distress.     Breath sounds: Normal breath sounds. No wheezing or rales.  Abdominal:     General: Bowel sounds are normal. There is no distension.     Palpations: Abdomen is soft. There is no mass.     Tenderness: There is abdominal tenderness in the epigastric area. There is no guarding or rebound.  Skin:    General: Skin is warm and dry.  Neurological:     Mental Status: She is alert and oriented to person, place, and time.      No results found for any visits on 01/26/19.     Assessment & Plan    1. Alcohol-induced chronic pancreatitis (HCC) Worsening again. Patient is s/p whipple and followed by [facility] GI transplant. Will give norco as below for flares prn. Continue to follow up with specialist as scheduled.  - HYDROcodone-acetaminophen (NORCO/VICODIN) 5-325 MG tablet; Take 1 tablet by mouth every 8 (eight) hours as needed for moderate pain.  Dispense: 90 tablet; Refill: 0  2. Type 2 diabetes mellitus with diabetic polyneuropathy, with long-term current use of insulin (HCC) Stable. Followed by [facility].      Mar Daring, PA-C  [facility]

## 2019-01-25 NOTE — Telephone Encounter (Signed)
Patient is calling with chronic pain ( 4 days)- she has seen surgeon- pancreas is acting up again- she request medication. Glucose is good and patient is taking her medications for pancreatitis. Patient advised ED for severe pain- but she declines- this is same chronic problem- call to office and permission to schedule granted. Patient advised if worse symptoms or changes- go to ED. Reason for Disposition . [1] SEVERE pain (e.g., excruciating) AND [2] present > 1 hour  Answer Assessment - Initial Assessment Questions 1. LOCATION: "Where does it hurt?"      Middle of abdomen, up sides and into back 2. RADIATION: "Does the pain shoot anywhere else?" (e.g., chest, back)     When pain hits- it radiates to back 3. ONSET: "When did the pain begin?" (e.g., minutes, hours or days ago)      4 days ago 4. SUDDEN: "Gradual or sudden onset?"     Sudden onset 5. PATTERN "Does the pain come and go, or is it constant?"    - If constant: "Is it getting better, staying the same, or worsening?"      (Note: Constant means the pain never goes away completely; most serious pain is constant and it progresses)     - If intermittent: "How long does it last?" "Do you have pain now?"     (Note: Intermittent means the pain goes away completely between bouts)     Constant yesterday 6. SEVERITY: "How bad is the pain?"  (e.g., Scale 1-10; mild, moderate, or severe)   - MILD (1-3): doesn't interfere with normal activities, abdomen soft and not tender to touch    - MODERATE (4-7): interferes with normal activities or awakens from sleep, tender to touch    - SEVERE (8-10): excruciating pain, doubled over, unable to do any normal activities      9 7. RECURRENT SYMPTOM: "Have you ever had this type of abdominal pain before?" If so, ask: "When was the last time?" and "What happened that time?"      Yes- no different as before- patient wants PCP to handle pain- surgeon and got ok from them. Tramadol does not hurt 8. CAUSE:  "What do you think is causing the abdominal pain?"     Pancreas stint removed 9. RELIEVING/AGGRAVATING FACTORS: "What makes it better or worse?" (e.g., movement, antacids, bowel movement)     Heating pad helps, food makes it worse 10. OTHER SYMPTOMS: "Has there been any vomiting, diarrhea, constipation, or urine problems?"       Some nausea- patient has medication for that 11. PREGNANCY: "Is there any chance you are pregnant?" "When was your last menstrual period?"       n/a  Protocols used: ABDOMINAL PAIN - [facility]

## 2019-01-26 ENCOUNTER — Other Ambulatory Visit: Payer: Self-pay

## 2019-01-26 ENCOUNTER — Ambulatory Visit (INDEPENDENT_AMBULATORY_CARE_PROVIDER_SITE_OTHER): Payer: BLUE CROSS/BLUE SHIELD | Admitting: Physician Assistant

## 2019-01-26 ENCOUNTER — Encounter: Payer: Self-pay | Admitting: Physician Assistant

## 2019-01-26 ENCOUNTER — Telehealth: Payer: Self-pay

## 2019-01-26 VITALS — BP 105/73 | HR 99 | Temp 96.8°F | Wt 124.6 lb

## 2019-01-26 DIAGNOSIS — K860 Alcohol-induced chronic pancreatitis: Secondary | ICD-10-CM

## 2019-01-26 DIAGNOSIS — Z794 Long term (current) use of insulin: Secondary | ICD-10-CM | POA: Diagnosis not present

## 2019-01-26 DIAGNOSIS — E1142 Type 2 diabetes mellitus with diabetic polyneuropathy: Secondary | ICD-10-CM | POA: Diagnosis not present

## 2019-01-26 MED ORDER — HYDROCODONE-ACETAMINOPHEN 5-325 MG PO TABS: 1.0000 | ORAL_TABLET | Freq: Three times a day (TID) | ORAL | 0 refills | Status: DC | PRN

## 2019-01-26 NOTE — Telephone Encounter (Signed)
Copied from [place] [id number]. Topic: General - Other >> Jan 26, 2019  8:10 AM Lennox Solders wrote: Reason for CRM: pt has an appt today at 11 am and is requesting jennifer nurse to call her before she comes

## 2019-01-30 ENCOUNTER — Encounter: Payer: Self-pay | Admitting: Physician Assistant

## 2019-01-30 DIAGNOSIS — B3731 Acute candidiasis of vulva and vagina: Secondary | ICD-10-CM

## 2019-01-30 DIAGNOSIS — B373 Candidiasis of vulva and vagina: Secondary | ICD-10-CM

## 2019-01-31 ENCOUNTER — Other Ambulatory Visit: Payer: Self-pay

## 2019-01-31 ENCOUNTER — Encounter: Payer: Self-pay | Admitting: Physician Assistant

## 2019-01-31 ENCOUNTER — Ambulatory Visit: Payer: Self-pay

## 2019-01-31 DIAGNOSIS — Z20822 Contact with and (suspected) exposure to covid-19: Secondary | ICD-10-CM

## 2019-01-31 MED ORDER — FLUCONAZOLE 150 MG PO TABS: 150.0000 mg | ORAL_TABLET | Freq: Once | ORAL | 0 refills | Status: AC

## 2019-01-31 MED ORDER — TERCONAZOLE 0.4 % VA CREA: 1.0000 | TOPICAL_CREAM | Freq: Every day | VAGINAL | 0 refills | Status: DC

## 2019-01-31 NOTE — Telephone Encounter (Signed)
From [facility] 

## 2019-01-31 NOTE — Telephone Encounter (Signed)
Pt. Concerned about COVID 19 exposure at work. Refuses a virtual appointment at this time. States she will go to be tested at [employer] location. Will call back as needed.  Answer Assessment - Initial Assessment Questions 1. COVID-19 CLOSE CONTACT: "Who is the person with the confirmed or suspected COVID-19 infection that you were exposed to?"     Some one at work 2. PLACE of CONTACT: "Where were you when you were exposed to COVID-19?" (e.g., home, school, medical waiting room; which city?)     Last week at work - Saturday 3. TYPE of CONTACT: "How much contact was there?" (e.g., sitting next to, live in same house, work in same office, same building)     At work 4. DURATION of CONTACT: "How long were you in contact with the COVID-19 patient?" (e.g., a few seconds, passed by person, a few minutes, 15 minutes or longer, live with the patient)     Co-worker 5. MASK: "Were you wearing a mask?" "Was the other person wearing a mask?" Note: wearing a mask reduces the risk of an  otherwise close contact.     Yes 6. DATE of CONTACT: "When did you have contact with a COVID-19 patient?" (e.g., how many days ago)     Saturday 7. COMMUNITY SPREAD: "Are there lots of cases of COVID-19 (community spread) where you live?" (See public health department website, if unsure)       Yes 8. SYMPTOMS: "Do you have any symptoms?" (e.g., fever, cough, breathing difficulty, loss of taste or smell)     Fever, sore throat, vomiting 9. PREGNANCY OR POSTPARTUM: "Is there any chance you are pregnant?" "When was your last menstrual period?" "Did you deliver in the last 2 weeks?"     No 10. HIGH RISK: "Do you have any heart or lung problems? Do you have a weak immune system?" (e.g., heart failure, COPD, asthma, HIV positive, chemotherapy, renal failure, diabetes mellitus, sickle cell anemia, obesity)       Diabetic, chronic pancreas issues 11.  TRAVEL: "Have you traveled out of the country recently?" If so, "When and  where?"  Also ask about out-of-state travel, since the CDC has identified some high-risk cities for community spread in the [place].  Note: Travel becomes less relevant if there is widespread community transmission where the patient lives.       No  Protocols used: CORONAVIRUS (COVID-19) EXPOSURE-A-AH

## 2019-01-31 NOTE — Telephone Encounter (Signed)
Noted  

## 2019-02-01 ENCOUNTER — Encounter: Payer: Self-pay | Admitting: Physician Assistant

## 2019-02-01 LAB — NOVEL CORONAVIRUS, NAA: SARS-CoV-2, NAA: NOT DETECTED

## 2019-02-14 ENCOUNTER — Encounter: Payer: Self-pay | Admitting: Physician Assistant

## 2019-02-14 NOTE — Telephone Encounter (Signed)
Phone message on same issue from patient addressed. Will close this.

## 2019-02-28 ENCOUNTER — Telehealth: Payer: Self-pay | Admitting: Physician Assistant

## 2019-02-28 ENCOUNTER — Encounter: Payer: Self-pay | Admitting: Physician Assistant

## 2019-02-28 DIAGNOSIS — K860 Alcohol-induced chronic pancreatitis: Secondary | ICD-10-CM

## 2019-02-28 DIAGNOSIS — G479 Sleep disorder, unspecified: Secondary | ICD-10-CM

## 2019-02-28 MED ORDER — CLONAZEPAM 1 MG PO TABS: 1.0000 mg | ORAL_TABLET | Freq: Two times a day (BID) | ORAL | 3 refills | Status: DC | PRN

## 2019-02-28 MED ORDER — HYDROCODONE-ACETAMINOPHEN 5-325 MG PO TABS: 1.0000 | ORAL_TABLET | Freq: Three times a day (TID) | ORAL | 0 refills | Status: DC | PRN

## 2019-02-28 NOTE — Telephone Encounter (Signed)
[facility] faxed refill request for the following medications:  clonazePAM (KLONOPIN) 1 MG tablet  Last Rx: 12/24/2018 LOV: 01/26/2019 Please advise. Thanks TNP

## 2019-04-02 MED ORDER — PANCRELIPASE (LIP-PROT-AMYL) 12000-38000 UNITS PO CPEP
1.00 | ORAL_CAPSULE | ORAL | Status: DC
Start: 2019-04-02 — End: 2019-04-02

## 2019-04-02 MED ORDER — LABETALOL HCL 5 MG/ML IV SOLN
10.00 | INTRAVENOUS | Status: DC
Start: ? — End: 2019-04-02

## 2019-04-02 MED ORDER — DEXTROSE 50 % IV SOLN
12.50 | INTRAVENOUS | Status: DC
Start: ? — End: 2019-04-02

## 2019-04-02 MED ORDER — IPRATROPIUM-ALBUTEROL 0.5-2.5 (3) MG/3ML IN SOLN
3.00 | RESPIRATORY_TRACT | Status: DC
Start: 2019-04-02 — End: 2019-04-02

## 2019-04-02 MED ORDER — ONDANSETRON HCL 8 MG PO TABS
8.00 | ORAL_TABLET | ORAL | Status: DC
Start: ? — End: 2019-04-02

## 2019-04-02 MED ORDER — ASPIRIN 81 MG PO CHEW
81.00 | CHEWABLE_TABLET | ORAL | Status: DC
Start: 2019-04-03 — End: 2019-04-02

## 2019-04-02 MED ORDER — GABAPENTIN 300 MG PO CAPS
300.00 | ORAL_CAPSULE | ORAL | Status: DC
Start: 2019-04-02 — End: 2019-04-02

## 2019-04-02 MED ORDER — SUCRALFATE 1 GM/10ML PO SUSP
1.00 | ORAL | Status: DC
Start: 2019-04-02 — End: 2019-04-02

## 2019-04-02 MED ORDER — ACETAMINOPHEN 325 MG PO TABS
650.00 | ORAL_TABLET | ORAL | Status: DC
Start: 2019-04-02 — End: 2019-04-02

## 2019-04-02 MED ORDER — INSULIN LISPRO 100 UNIT/ML ~~LOC~~ SOLN
2.00 | SUBCUTANEOUS | Status: DC
Start: 2019-04-02 — End: 2019-04-02

## 2019-04-02 MED ORDER — INSULIN LISPRO 100 UNIT/ML ~~LOC~~ SOLN
0.00 | SUBCUTANEOUS | Status: DC
Start: 2019-04-02 — End: 2019-04-02

## 2019-04-02 MED ORDER — GENERIC EXTERNAL MEDICATION
Status: DC
Start: ? — End: 2019-04-02

## 2019-04-02 MED ORDER — CLONAZEPAM 0.5 MG PO TABS
0.50 | ORAL_TABLET | ORAL | Status: DC
Start: ? — End: 2019-04-02

## 2019-04-02 MED ORDER — FAMOTIDINE 20 MG PO TABS
20.00 | ORAL_TABLET | ORAL | Status: DC
Start: 2019-04-02 — End: 2019-04-02

## 2019-04-02 MED ORDER — ENOXAPARIN SODIUM 30 MG/0.3ML ~~LOC~~ SOLN
30.00 | SUBCUTANEOUS | Status: DC
Start: 2019-04-02 — End: 2019-04-02

## 2019-04-02 MED ORDER — INSULIN NPH (HUMAN) (ISOPHANE) 100 UNIT/ML ~~LOC~~ SUSP
7.00 | SUBCUTANEOUS | Status: DC
Start: 2019-04-02 — End: 2019-04-02

## 2019-04-02 MED ORDER — NICOTINE 14 MG/24HR TD PT24
1.00 | MEDICATED_PATCH | TRANSDERMAL | Status: DC
Start: 2019-04-03 — End: 2019-04-02

## 2019-04-02 MED ORDER — HYDRALAZINE HCL 20 MG/ML IJ SOLN
10.00 | INTRAMUSCULAR | Status: DC
Start: ? — End: 2019-04-02

## 2019-04-02 MED ORDER — SODIUM CHLORIDE 3 % IN NEBU
4.00 | INHALATION_SOLUTION | RESPIRATORY_TRACT | Status: DC
Start: 2019-04-02 — End: 2019-04-02

## 2019-04-02 MED ORDER — AMLODIPINE BESYLATE 10 MG PO TABS
10.00 | ORAL_TABLET | ORAL | Status: DC
Start: 2019-04-03 — End: 2019-04-02

## 2019-04-02 MED ORDER — METOCLOPRAMIDE HCL 10 MG PO TABS
10.00 | ORAL_TABLET | ORAL | Status: DC
Start: 2019-04-02 — End: 2019-04-02

## 2019-04-02 MED ORDER — CARVEDILOL 6.25 MG PO TABS
6.25 | ORAL_TABLET | ORAL | Status: DC
Start: 2019-04-02 — End: 2019-04-02

## 2019-04-02 MED ORDER — MELATONIN 3 MG PO TABS
3.00 | ORAL_TABLET | ORAL | Status: DC
Start: ? — End: 2019-04-02

## 2019-04-02 MED ORDER — AMOXICILLIN-POT CLAVULANATE 875-125 MG PO TABS
875.00 | ORAL_TABLET | ORAL | Status: DC
Start: 2019-04-02 — End: 2019-04-02

## 2019-04-02 MED ORDER — SCOPOLAMINE 1 MG/3DAYS TD PT72
1.00 | MEDICATED_PATCH | TRANSDERMAL | Status: DC
Start: 2019-04-04 — End: 2019-04-02

## 2019-04-04 MED ORDER — GABAPENTIN 300 MG PO CAPS
300.00 | ORAL_CAPSULE | ORAL | Status: DC
Start: 2019-04-02 — End: 2019-04-04

## 2019-04-04 MED ORDER — CLONAZEPAM 0.5 MG PO TABS
0.50 | ORAL_TABLET | ORAL | Status: DC
Start: ? — End: 2019-04-04

## 2019-04-04 MED ORDER — HYDRALAZINE HCL 20 MG/ML IJ SOLN
10.00 | INTRAMUSCULAR | Status: DC
Start: ? — End: 2019-04-04

## 2019-04-04 MED ORDER — ONDANSETRON HCL 8 MG PO TABS
8.00 | ORAL_TABLET | ORAL | Status: DC
Start: ? — End: 2019-04-04

## 2019-04-04 MED ORDER — METOCLOPRAMIDE HCL 10 MG PO TABS
10.00 | ORAL_TABLET | ORAL | Status: DC
Start: 2019-04-02 — End: 2019-04-04

## 2019-04-04 MED ORDER — ENOXAPARIN SODIUM 30 MG/0.3ML ~~LOC~~ SOLN
30.00 | SUBCUTANEOUS | Status: DC
Start: 2019-04-02 — End: 2019-04-04

## 2019-04-04 MED ORDER — AMLODIPINE BESYLATE 10 MG PO TABS
10.00 | ORAL_TABLET | ORAL | Status: DC
Start: 2019-04-03 — End: 2019-04-04

## 2019-04-04 MED ORDER — ACETAMINOPHEN 325 MG PO TABS
650.00 | ORAL_TABLET | ORAL | Status: DC
Start: 2019-04-02 — End: 2019-04-04

## 2019-04-04 MED ORDER — INSULIN NPH (HUMAN) (ISOPHANE) 100 UNIT/ML ~~LOC~~ SUSP
7.00 | SUBCUTANEOUS | Status: DC
Start: 2019-04-02 — End: 2019-04-04

## 2019-04-04 MED ORDER — SODIUM CHLORIDE 3 % IN NEBU
4.00 | INHALATION_SOLUTION | RESPIRATORY_TRACT | Status: DC
Start: 2019-04-02 — End: 2019-04-04

## 2019-04-04 MED ORDER — SCOPOLAMINE 1 MG/3DAYS TD PT72
1.00 | MEDICATED_PATCH | TRANSDERMAL | Status: DC
Start: 2019-04-04 — End: 2019-04-04

## 2019-04-04 MED ORDER — INSULIN LISPRO 100 UNIT/ML ~~LOC~~ SOLN
0.00 | SUBCUTANEOUS | Status: DC
Start: 2019-04-02 — End: 2019-04-04

## 2019-04-04 MED ORDER — LABETALOL HCL 5 MG/ML IV SOLN
10.00 | INTRAVENOUS | Status: DC
Start: ? — End: 2019-04-04

## 2019-04-04 MED ORDER — INSULIN LISPRO 100 UNIT/ML ~~LOC~~ SOLN
2.00 | SUBCUTANEOUS | Status: DC
Start: 2019-04-02 — End: 2019-04-04

## 2019-04-04 MED ORDER — AMOXICILLIN-POT CLAVULANATE 875-125 MG PO TABS
875.00 | ORAL_TABLET | ORAL | Status: DC
Start: 2019-04-02 — End: 2019-04-04

## 2019-04-04 MED ORDER — ASPIRIN 81 MG PO CHEW
81.00 | CHEWABLE_TABLET | ORAL | Status: DC
Start: 2019-04-03 — End: 2019-04-04

## 2019-04-04 MED ORDER — DEXTROSE 50 % IV SOLN
12.50 | INTRAVENOUS | Status: DC
Start: ? — End: 2019-04-04

## 2019-04-04 MED ORDER — CARVEDILOL 6.25 MG PO TABS
6.25 | ORAL_TABLET | ORAL | Status: DC
Start: 2019-04-02 — End: 2019-04-04

## 2019-04-04 MED ORDER — MELATONIN 3 MG PO TABS
3.00 | ORAL_TABLET | ORAL | Status: DC
Start: ? — End: 2019-04-04

## 2019-04-04 MED ORDER — FAMOTIDINE 20 MG PO TABS
20.00 | ORAL_TABLET | ORAL | Status: DC
Start: 2019-04-02 — End: 2019-04-04

## 2019-04-04 MED ORDER — PANCRELIPASE (LIP-PROT-AMYL) 12000-38000 UNITS PO CPEP
1.00 | ORAL_CAPSULE | ORAL | Status: DC
Start: 2019-04-02 — End: 2019-04-04

## 2019-04-04 MED ORDER — IPRATROPIUM-ALBUTEROL 0.5-2.5 (3) MG/3ML IN SOLN
3.00 | RESPIRATORY_TRACT | Status: DC
Start: 2019-04-02 — End: 2019-04-04

## 2019-04-04 MED ORDER — SUCRALFATE 1 GM/10ML PO SUSP
1.00 | ORAL | Status: DC
Start: 2019-04-02 — End: 2019-04-04

## 2019-04-04 MED ORDER — GENERIC EXTERNAL MEDICATION
Status: DC
Start: ? — End: 2019-04-04

## 2019-04-04 MED ORDER — NICOTINE 14 MG/24HR TD PT24
1.00 | MEDICATED_PATCH | TRANSDERMAL | Status: DC
Start: 2019-04-03 — End: 2019-04-04

## 2019-04-06 ENCOUNTER — Encounter: Payer: Self-pay | Admitting: Physician Assistant

## 2019-04-08 ENCOUNTER — Encounter: Payer: Self-pay | Admitting: Physician Assistant

## 2019-04-08 ENCOUNTER — Ambulatory Visit (INDEPENDENT_AMBULATORY_CARE_PROVIDER_SITE_OTHER): Payer: BLUE CROSS/BLUE SHIELD | Admitting: Physician Assistant

## 2019-04-08 DIAGNOSIS — Z794 Long term (current) use of insulin: Secondary | ICD-10-CM | POA: Insufficient documentation

## 2019-04-08 DIAGNOSIS — Z9081 Acquired absence of spleen: Secondary | ICD-10-CM | POA: Diagnosis not present

## 2019-04-08 DIAGNOSIS — E0842 Diabetes mellitus due to underlying condition with diabetic polyneuropathy: Secondary | ICD-10-CM | POA: Diagnosis not present

## 2019-04-08 DIAGNOSIS — Z90410 Acquired total absence of pancreas: Secondary | ICD-10-CM | POA: Insufficient documentation

## 2019-04-08 DIAGNOSIS — R059 Cough, unspecified: Secondary | ICD-10-CM

## 2019-04-08 DIAGNOSIS — R05 Cough: Secondary | ICD-10-CM

## 2019-04-08 MED ORDER — BENZONATATE 200 MG PO CAPS: 200.0000 mg | ORAL_CAPSULE | Freq: Three times a day (TID) | ORAL | 0 refills | Status: DC | PRN

## 2019-04-08 MED ORDER — HYDROCODONE-ACETAMINOPHEN 5-325 MG PO TABS: 1.0000 | ORAL_TABLET | Freq: Three times a day (TID) | ORAL | 0 refills | Status: DC | PRN

## 2019-04-08 NOTE — Progress Notes (Signed)
Patient: Veronica Lozano Female    DOB: 04-Feb-1966   54 y.o.   MRN: [id number] Visit Date: 04/08/2019  Today's Provider: Mar Daring, PA-C   No chief complaint on file.  Subjective:    HPI   The patient is a 54 year old female who presents via phone visit due to inability to connect for video.  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  She states she is doing pretty well.  She is still having good and bad days.  When asked what makes a bad day she stated that she is just still tired from the surgery.   She has had many new medications added and they are listed.   She has no complaints at this time.   Virtual Visit via Telephone Note  I connected with [place] on 04/08/19 at 10:40 AM EST by telephone and verified that I am speaking with the correct person using two identifiers.  Location: Patient: home Provider: office   I discussed the limitations, risks, security and privacy concerns of performing an evaluation and management service by telephone and the availability of in person appointments. I also discussed with the patient that there may be a patient responsible charge related to this service. The patient expressed understanding and agreed to proceed.   Allergies  Allergen Reactions  . Latex Hives and Swelling  . Coconut Oil Hives  . Glipizide Hives  . Metformin Hives  . Pantoprazole     Other reaction(s): Vomiting  . Morphine And Related Itching and Rash     Current Outpatient Medications:  .  acetaminophen (TYLENOL) 325 MG tablet, Take 650 mg by mouth., Disp: , Rfl:  .  acyclovir (ZOVIRAX) 800 MG tablet, take 1 tablet by mouth once daily if needed, Disp: 90 tablet, Rfl: 3 .  aspirin EC 81 MG tablet, Take 81 mg by mouth daily., Disp: , Rfl:  .  atorvastatin (LIPITOR) 20 MG tablet, Take by mouth., Disp: , Rfl:  .  carvedilol (COREG) 6.25 MG tablet, Take 6.25 mg by mouth 2 (two)  times daily with a meal., Disp: , Rfl:  .  clonazePAM (KLONOPIN) 1 MG tablet, Take 1 tablet (1 mg total) by mouth 2 (two) times daily as needed for anxiety., Disp: 60 tablet, Rfl: 3 .  CREON 36000 units CPEP capsule, TAKE 1 CAPSULE BY MOUTH THREE TIMES DAILY WITH MEALS (Patient taking differently: Take 65,000 Units by mouth 3 (three) times daily before meals. ), Disp: 270 capsule, Rfl: 1 .  docusate sodium (COLACE) 100 MG capsule, Take 100 mg by mouth 2 (two) times daily., Disp: , Rfl:  .  enoxaparin (LOVENOX) 40 MG/0.4ML injection, Inject 40 mg into the skin daily., Disp: , Rfl:  .  famotidine (PEPCID) 20 MG tablet, Take 20 mg by mouth at bedtime., Disp: , Rfl:  .  folic acid (FOLVITE) 1 MG tablet, Take 1 mg by mouth daily., Disp: , Rfl:  .  gabapentin (NEURONTIN) 300 MG capsule, Take 300 mg by mouth., Disp: , Rfl:  .  HYDROcodone-acetaminophen (NORCO/VICODIN) 5-325 MG tablet, Take 1 tablet by mouth every 8 (eight) hours as needed for moderate pain., Disp: 45 tablet, Rfl: 0 .  insulin aspart (NOVOLOG) 100 UNIT/ML injection, Inject 2 Units 3 (three) times daily before meals into the skin. Sliding scale, Disp: , Rfl:  .  insulin glargine (LANTUS) 100 UNIT/ML injection, Inject  0.35 mLs (35 Units total) into the skin at bedtime., Disp: 10 mL, Rfl: 11 .  metoCLOPramide (REGLAN) 10 MG tablet, Take 10 mg by mouth every 6 (six) hours as needed for nausea., Disp: , Rfl:  .  ondansetron (ZOFRAN) 4 MG tablet, Take 4 mg by mouth every 8 (eight) hours as needed for nausea or vomiting., Disp: , Rfl:  .  promethazine (PHENERGAN) 25 MG tablet, Take 25 mg by mouth every 6 (six) hours as needed for nausea or vomiting., Disp: , Rfl:  .  sucralfate (CARAFATE) 1 g tablet, Take by mouth., Disp: , Rfl:  .  benzonatate (TESSALON) 200 MG capsule, Take 1 capsule (200 mg total) by mouth 3 (three) times daily as needed., Disp: 30 capsule, Rfl: 0 .  Blood Glucose Monitoring Suppl (CONTOUR NEXT MONITOR) w/Device KIT, use to TEST  BLOOD SUGAR, Disp: , Rfl: 0 .  Blood Glucose Monitoring Suppl (GLUCOCOM BLOOD GLUCOSE MONITOR) DEVI, Contour Next Link Blood Glucose Meter, Disp: , Rfl:  .  budesonide-formoterol (SYMBICORT) 160-4.5 MCG/ACT inhaler, Inhale 2 puffs into the lungs 2 (two) times daily. (Patient not taking: Reported on 04/08/2019), Disp: 1 Inhaler, Rfl: 3 .  empagliflozin (JARDIANCE) 25 MG TABS tablet, Take by mouth., Disp: , Rfl:  .  polyethylene glycol (MIRALAX / GLYCOLAX) 17 g packet, Take 17 g by mouth daily., Disp: , Rfl:   Review of Systems  Constitutional: Positive for fatigue. Negative for chills and fever.  HENT: Negative for congestion, sinus pressure and sore throat.   Respiratory: Positive for cough (using Tessalon perles). Negative for shortness of breath.   Cardiovascular: Negative for chest pain, palpitations and leg swelling.  Gastrointestinal: Negative for abdominal pain, anal bleeding, constipation, diarrhea, nausea and vomiting.    Social History   Tobacco Use  . Smoking status: Current Every Day Smoker    Packs/day: 0.25    Years: 25.00    Pack years: 6.25    Types: Cigarettes  . Smokeless tobacco: Never Used  Substance Use Topics  . Alcohol use: No    Alcohol/week: 0.0 standard drinks      Objective:   There were no vitals taken for this visit. There were no vitals filed for this visit.There is no height or weight on file to calculate BMI.   Physical Exam Vitals reviewed.  Constitutional:      General: She is not in acute distress. Pulmonary:     Effort: No respiratory distress.  Neurological:     Mental Status: She is alert.      No results found for any visits on 04/08/19.     Assessment & Plan     1. Cough Developing small cough postoperatively. Reports she does not feel sick but thinks it is coming from her not being able to cough hard due to surgical pain. Will give tessalon perles as below to help lessen cough. Call if worsening.  - benzonatate (TESSALON) 200  MG capsule; Take 1 capsule (200 mg total) by mouth 3 (three) times daily as needed.  Dispense: 30 capsule; Refill: 0  2. S/P splenectomy Had surgery on 03/22/19 and was in hospital until 04/02/19. Since being home she reports she is doing well. She has been getting immunizations from [facility]. 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 She is awaiting an urgent Endocrinology appt for further management of her diabetes. Pain medication refilled as below. [place] reviewed.  - HYDROcodone-acetaminophen (NORCO/VICODIN) 5-325 MG tablet; Take 1 tablet by mouth every 8 (  eight) hours as needed for moderate pain.  Dispense: 45 tablet; Refill: 0  3. 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 Doing well. See above.  - HYDROcodone-acetaminophen (NORCO/VICODIN) 5-325 MG tablet; Take 1 tablet by mouth every 8 (eight) hours as needed for moderate pain.  Dispense: 45 tablet; Refill: 0  4. Diabetes mellitus due to underlying condition with diabetic polyneuropathy, with long-term current use of insulin (HCC) Stable currently. Using CCG monitoring.   I discussed the assessment and treatment plan with the patient. The patient was provided an opportunity to ask questions and all were answered. The patient agreed with the plan and demonstrated an understanding of the instructions.   The patient was advised to call back or seek an in-person evaluation if the symptoms worsen or if the condition fails to improve as anticipated.  I provided 14 minutes of non-face-to-face time during this encounter.    HPI, Exam and A&P Transcribed under the direction and in the presence of  Mar Daring, PA-C Electronically Signed: Althea Charon, [place], PA-C  [facility]

## 2019-05-04 ENCOUNTER — Encounter: Payer: Self-pay | Admitting: Physician Assistant

## 2019-05-04 DIAGNOSIS — G479 Sleep disorder, unspecified: Secondary | ICD-10-CM

## 2019-05-05 MED ORDER — CLONAZEPAM 1 MG PO TABS: 1.0000 mg | ORAL_TABLET | Freq: Two times a day (BID) | ORAL | 5 refills | Status: AC | PRN

## 2019-06-02 ENCOUNTER — Encounter: Payer: Self-pay | Admitting: Physician Assistant

## 2019-06-02 DIAGNOSIS — B379 Candidiasis, unspecified: Secondary | ICD-10-CM

## 2019-06-02 MED ORDER — FLUCONAZOLE 150 MG PO TABS: 150.0000 mg | ORAL_TABLET | Freq: Once | ORAL | 0 refills | Status: AC

## 2019-08-14 ENCOUNTER — Ambulatory Visit
Admission: EM | Admit: 2019-08-14 | Discharge: 2019-08-14 | Disposition: A | Discharge status: Home / Self Care | Payer: BLUE CROSS/BLUE SHIELD | Attending: Internal Medicine | Admitting: Internal Medicine

## 2019-08-14 ENCOUNTER — Other Ambulatory Visit: Payer: Self-pay

## 2019-08-14 DIAGNOSIS — I10 Essential (primary) hypertension: Secondary | ICD-10-CM | POA: Insufficient documentation

## 2019-08-14 DIAGNOSIS — Z91040 Latex allergy status: Secondary | ICD-10-CM | POA: Insufficient documentation

## 2019-08-14 DIAGNOSIS — Z794 Long term (current) use of insulin: Secondary | ICD-10-CM | POA: Diagnosis not present

## 2019-08-14 DIAGNOSIS — F17210 Nicotine dependence, cigarettes, uncomplicated: Secondary | ICD-10-CM | POA: Insufficient documentation

## 2019-08-14 DIAGNOSIS — Z803 Family history of malignant neoplasm of breast: Secondary | ICD-10-CM | POA: Diagnosis not present

## 2019-08-14 DIAGNOSIS — Z807 Family history of other malignant neoplasms of lymphoid, hematopoietic and related tissues: Secondary | ICD-10-CM | POA: Insufficient documentation

## 2019-08-14 DIAGNOSIS — Z888 Allergy status to other drugs, medicaments and biological substances status: Secondary | ICD-10-CM | POA: Diagnosis not present

## 2019-08-14 DIAGNOSIS — Z833 Family history of diabetes mellitus: Secondary | ICD-10-CM | POA: Insufficient documentation

## 2019-08-14 DIAGNOSIS — Z801 Family history of malignant neoplasm of trachea, bronchus and lung: Secondary | ICD-10-CM | POA: Diagnosis not present

## 2019-08-14 DIAGNOSIS — E1142 Type 2 diabetes mellitus with diabetic polyneuropathy: Secondary | ICD-10-CM | POA: Diagnosis not present

## 2019-08-14 DIAGNOSIS — Z885 Allergy status to narcotic agent status: Secondary | ICD-10-CM | POA: Diagnosis not present

## 2019-08-14 DIAGNOSIS — J029 Acute pharyngitis, unspecified: Secondary | ICD-10-CM | POA: Diagnosis not present

## 2019-08-14 DIAGNOSIS — Z7951 Long term (current) use of inhaled steroids: Secondary | ICD-10-CM | POA: Diagnosis not present

## 2019-08-14 DIAGNOSIS — Z79899 Other long term (current) drug therapy: Secondary | ICD-10-CM | POA: Diagnosis not present

## 2019-08-14 DIAGNOSIS — Z8051 Family history of malignant neoplasm of kidney: Secondary | ICD-10-CM | POA: Diagnosis not present

## 2019-08-14 DIAGNOSIS — Z20822 Contact with and (suspected) exposure to covid-19: Secondary | ICD-10-CM | POA: Insufficient documentation

## 2019-08-14 DIAGNOSIS — Z9081 Acquired absence of spleen: Secondary | ICD-10-CM | POA: Insufficient documentation

## 2019-08-14 DIAGNOSIS — R05 Cough: Secondary | ICD-10-CM | POA: Diagnosis present

## 2019-08-14 LAB — SARS CORONAVIRUS 2 AG (30 MIN TAT): SARS Coronavirus 2 Ag: NEGATIVE

## 2019-08-14 MED ORDER — BENZONATATE 100 MG PO CAPS: 100.0000 mg | ORAL_CAPSULE | Freq: Three times a day (TID) | ORAL | 0 refills | Status: AC

## 2019-08-14 NOTE — ED Triage Notes (Signed)
Patient states that she has been coughing since yesterday and woke up today with no taste or smell and ear pain. States that she had the [place] [employer] [place] vaccine in march but would like to be tested for [employer].

## 2019-08-14 NOTE — ED Provider Notes (Signed)
[facility]    CSN: [id number] Arrival date & time: 08/14/19  1427      History   Chief Complaint Chief Complaint  Patient presents with  . Cough    HPI Veronica Lozano is a 54 y.o. female comes to the urgent care with complaints of coughing it started yesterday.  Patient says cough started fairly suddenly and has been persistent.  No fever or chills.  Patient has lost her sense of taste and smell.  She also has ear pain.  Patient is fully vaccinated with the [facility] vaccine.  She is coming to the urgent care for Covid testing.  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   HPI  Past Medical History:  Diagnosis Date  . Diabetes mellitus without complication ([place])   . Heartburn   . HLD (hyperlipidemia)   . Hypertension   . Pancreatitis     Patient Active Problem List   Diagnosis Date Noted  . 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 Diabetes mellitus due to underlying condition with diabetic polyneuropathy, with long-term current use of insulin ([place]) 04/08/2019  . Type 2 diabetes mellitus with diabetic polyneuropathy, with long-term current use of insulin ([place]) 01/26/2019  . Smoker 10/12/2017  . Chest pain with high risk for cardiac etiology 11/05/2015  . Tobacco dependence 10/17/2015  . Hyperlipidemia, mixed 10/17/2015  . Microscopic hematuria 04/02/2015  . Nocturia 04/02/2015  . Long term current use of opiate analgesic 03/21/2015  . Encounter for therapeutic drug level monitoring 03/21/2015  . Chronic abdominal pain (epigastric) (Location of Primary Source of Pain) 03/21/2015  . Encounter for pain management planning 03/21/2015  . Chronic pain 03/21/2015  . Pain management 03/21/2015  . Blood pressure elevated 03/19/2015  . Chronic recurrent pancreatitis ([place]) 03/19/2015  . T2DM (type 2 diabetes mellitus) ([place]) 03/19/2015  . Insomnia, persistent 10/30/2014  . History of  shingles 10/25/2014    Past Surgical History:  Procedure Laterality Date  . ABDOMINAL HYSTERECTOMY    . APPENDECTOMY    . CARDIAC CATHETERIZATION    . CARPAL TUNNEL RELEASE Bilateral 1998  . CHOLECYSTECTOMY    . FOOT SURGERY Bilateral    for neuropathy    OB History   No obstetric history on file.      Home Medications    Prior to Admission medications   Medication Sig Start Date End Date Taking? Authorizing Provider  acetaminophen (TYLENOL) 325 MG tablet Take 650 mg by mouth. 12/18/16  Yes [provider]  acyclovir (ZOVIRAX) 800 MG tablet take 1 tablet by mouth once daily if needed 03/29/17  Yes Mar Daring, PA-C  atorvastatin (LIPITOR) 20 MG tablet Take by mouth. 10/15/17  Yes [provider]  Blood Glucose Monitoring Suppl (CONTOUR NEXT MONITOR) w/Device KIT use to TEST BLOOD SUGAR 12/22/16  Yes [provider]  Blood Glucose Monitoring Suppl (GLUCOCOM BLOOD GLUCOSE MONITOR) DEVI Contour Next Link Blood Glucose Meter 12/22/16  Yes [provider]  carvedilol (COREG) 6.25 MG tablet Take 6.25 mg by mouth 2 (two) times daily with a meal.   Yes [provider]  clonazePAM (KLONOPIN) 1 MG tablet Take 1 tablet (1 mg total) by mouth 2 (two) times daily as needed for anxiety. 05/05/19  Yes [place], Jennifer M, PA-C  CREON 36000 units CPEP capsule TAKE 1 CAPSULE BY MOUTH THREE TIMES DAILY WITH MEALS Patient taking differently: Take 65,000 Units by mouth 3 (three) times  daily before meals.  11/26/18  Yes Fenton Malling M, PA-C  insulin aspart (NOVOLOG) 100 UNIT/ML injection Inject 2 Units 3 (three) times daily before meals into the skin. Sliding scale   Yes [provider]  ondansetron (ZOFRAN) 4 MG tablet Take 4 mg by mouth every 8 (eight) hours as needed for nausea or vomiting.   Yes [provider]  benzonatate (TESSALON) 100 MG capsule Take 1 capsule (100 mg total) by mouth every 8 (eight) hours. 08/14/19    Chase Picket, MD  budesonide-formoterol (SYMBICORT) 160-4.5 MCG/ACT inhaler Inhale 2 puffs into the lungs 2 (two) times daily. Patient not taking: Reported on 04/08/2019 07/13/18 08/14/19  Trinna Post, PA-C  insulin glargine (LANTUS) 100 UNIT/ML injection Inject 0.35 mLs (35 Units total) into the skin at bedtime. 03/19/15 08/14/19  Mar Daring, PA-C  sucralfate (CARAFATE) 1 g tablet Take by mouth. 12/01/18 08/14/19  [provider]    Family History Family History  Problem Relation Age of Onset  . Cancer Father        Lymphoma  . Kidney cancer Father   . Cancer Mother        lung  . Diabetes Brother   . Cancer Brother   . Breast cancer Sister   . Bladder Cancer Neg Hx   . Prostate cancer Neg Hx     Social History Social History   Tobacco Use  . Smoking status: Current Every Day Smoker    Packs/day: 0.25    Years: 25.00    Pack years: 6.25    Types: Cigarettes  . Smokeless tobacco: Never Used  Vaping Use  . Vaping Use: Never used  Substance Use Topics  . Alcohol use: No    Alcohol/week: 0.0 standard drinks  . Drug use: No     Allergies   Latex, Coconut oil, Glipizide, Metformin, Pantoprazole, and Morphine and related   Review of Systems Review of Systems  Constitutional: Negative for activity change, chills and fever.  HENT: Positive for congestion, ear pain and sore throat. Negative for ear discharge, rhinorrhea, trouble swallowing and voice change.   Eyes: Negative for pain and redness.  Respiratory: Positive for cough. Negative for chest tightness and shortness of breath.   Gastrointestinal: Negative for abdominal pain, nausea and vomiting.  Genitourinary: Negative.      Physical Exam Triage Vital Signs ED Triage Vitals  Enc Vitals Group     BP 08/14/19 1450 117/75     Pulse Rate 08/14/19 1450 (!) 101     Resp 08/14/19 1450 18     Temp 08/14/19 1450 99.5 F (37.5 C)     Temp Source 08/14/19 1450 Oral     SpO2 08/14/19 1450 97 %      Weight 08/14/19 1446 114 lb (51.7 kg)     Height 08/14/19 1446 4' 11"  (1.499 m)     Head Circumference --      Peak Flow --      Pain Score 08/14/19 1446 4     Pain Loc --      Pain Edu? --      Excl. in [place]? --    No data found.  Updated Vital Signs BP 117/75 (BP Location: Left Arm)   Pulse (!) 101   Temp 99.5 F (37.5 C) (Oral)   Resp 18   Ht 4' 11"  (1.499 m)   Wt 51.7 kg   SpO2 97%   BMI 23.03 kg/m   Visual Acuity Right  Eye Distance:   Left Eye Distance:   Bilateral Distance:    Right Eye Near:   Left Eye Near:    Bilateral Near:     Physical Exam Vitals and nursing note reviewed.  Constitutional:      Appearance: She is ill-appearing.  HENT:     Right Ear: Tympanic membrane normal.     Left Ear: Tympanic membrane normal.     Nose: No rhinorrhea.     Mouth/Throat:     Pharynx: No posterior oropharyngeal erythema.  Eyes:     Extraocular Movements: Extraocular movements intact.     Conjunctiva/sclera: Conjunctivae normal.  Cardiovascular:     Rate and Rhythm: Normal rate and regular rhythm.     Pulses: Normal pulses.     Heart sounds: Normal heart sounds.  Pulmonary:     Effort: Pulmonary effort is normal.     Breath sounds: Normal breath sounds.  Abdominal:     General: Bowel sounds are normal.  Musculoskeletal:     Cervical back: Normal range of motion. No rigidity.  Lymphadenopathy:     Cervical: No cervical adenopathy.  Skin:    Capillary Refill: Capillary refill takes less than 2 seconds.  Neurological:     General: No focal deficit present.     Mental Status: She is alert and oriented to person, place, and time.      UC Treatments / Results  Labs (all labs ordered are listed, but only abnormal results are displayed) Labs Reviewed  SARS CORONAVIRUS 2 AG (30 MIN TAT)  SARS CORONAVIRUS 2 (TAT 6-24 HRS)    EKG   Radiology No results found.  Procedures Procedures (including critical care time)  Medications Ordered in  UC Medications - No data to display  Initial Impression / Assessment and Plan / UC Course  I have reviewed the triage vital signs and the nursing notes.  Pertinent labs & imaging results that were available during my care of the patient were reviewed by me and considered in my medical decision making (see chart for details).     1.  Acute viral pharyngitis: Rapid strep test is negative Covid PCR test sent Tessalon Perles as needed for cough Patient encouraged to push fluids Lung sounds clear If patient symptoms worsens she is advised to return to urgent care to be reevaluated. Final Clinical Impressions(s) / UC Diagnoses   Final diagnoses:  Acute viral pharyngitis   Discharge Instructions   None    ED Prescriptions    Medication Sig Dispense Auth. Provider   benzonatate (TESSALON) 100 MG capsule Take 1 capsule (100 mg total) by mouth every 8 (eight) hours. 21 capsule Sofiya Ezelle, Myrene Galas, MD     PDMP not reviewed this encounter.   Chase Picket, MD 08/14/19 [phone]

## 2019-08-15 LAB — SARS CORONAVIRUS 2 (TAT 6-24 HRS): SARS Coronavirus 2: NEGATIVE

## 2019-09-03 ENCOUNTER — Ambulatory Visit
Admission: EM | Admit: 2019-09-03 | Discharge: 2019-09-03 | Disposition: A | Discharge status: Home / Self Care | Payer: BLUE CROSS/BLUE SHIELD | Attending: Emergency Medicine | Admitting: Emergency Medicine

## 2019-09-03 ENCOUNTER — Other Ambulatory Visit: Payer: Self-pay

## 2019-09-03 ENCOUNTER — Encounter: Payer: Self-pay | Admitting: Emergency Medicine

## 2019-09-03 DIAGNOSIS — J320 Chronic maxillary sinusitis: Secondary | ICD-10-CM

## 2019-09-03 MED ORDER — AMOXICILLIN-POT CLAVULANATE 875-125 MG PO TABS: 1.0000 | ORAL_TABLET | Freq: Two times a day (BID) | ORAL | 0 refills | Status: AC

## 2019-09-03 NOTE — ED Triage Notes (Signed)
Patient c/o facial swelling and dental pain on the right side that started last night.

## 2019-09-03 NOTE — ED Provider Notes (Signed)
[facility] [place] [facility] - [place], [place]   Name: Veronica Lozano DOB: 01-25-1966 MRN: [id number] CSN: [id number] PCP: Margaretann Loveless, PA-C  Arrival date and time:  09/03/19 1533  Chief Complaint:  Facial Swelling (right side)   NOTE: Prior to seeing the patient today, I have reviewed the triage nursing documentation and vital signs. Clinical staff has updated patient's PMH/PSHx, current medication list, and drug allergies/intolerances to ensure comprehensive history available to assist in medical decision making.   History:   HPI: Veronica Lozano is a 54 y.o. female who presents today with complaints of facial pain and swelling.  Patient states that the pain started late last night/early this morning.  The pain starts on the right side of her nose and extends to her ear and down the back of her ear.  She denies any fevers, headaches, or any additional associated symptoms.  Patient does not have any teeth to the outside of her mouth, so she is not very concerned that this pain is from a dental source.  She has no previous surgical procedures to her sinuses.  She was recently treated for an upper respiratory infection with levofloxacin.  He states he feels much better after completing that treatment, and this new symptom is unassociated.   Past Medical History:  Diagnosis Date  . Diabetes mellitus without complication (HCC)   . Heartburn   . HLD (hyperlipidemia)   . Hypertension   . Pancreatitis     Past Surgical History:  Procedure Laterality Date  . ABDOMINAL HYSTERECTOMY    . APPENDECTOMY    . CARDIAC CATHETERIZATION    . CARPAL TUNNEL RELEASE Bilateral 1998  . CHOLECYSTECTOMY    . FOOT SURGERY Bilateral    for neuropathy    Family History  Problem Relation Age of Onset  . Cancer Father        Lymphoma  . Kidney cancer Father   . Cancer Mother        lung  . Diabetes Brother   . Cancer Brother   . Breast cancer Sister   . Bladder Cancer Neg Hx   . Prostate  cancer Neg Hx     Social History   Tobacco Use  . Smoking status: Current Every Day Smoker    Packs/day: 0.25    Years: 25.00    Pack years: 6.25    Types: Cigarettes  . Smokeless tobacco: Never Used  Vaping Use  . Vaping Use: Never used  Substance Use Topics  . Alcohol use: No    Alcohol/week: 0.0 standard drinks  . Drug use: No    Patient Active Problem List   Diagnosis Date Noted  . 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 Diabetes mellitus due to underlying condition with diabetic polyneuropathy, with long-term current use of insulin (HCC) 04/08/2019  . Type 2 diabetes mellitus with diabetic polyneuropathy, with long-term current use of insulin (HCC) 01/26/2019  . Smoker 10/12/2017  . Chest pain with high risk for cardiac etiology 11/05/2015  . Tobacco dependence 10/17/2015  . Hyperlipidemia, mixed 10/17/2015  . Microscopic hematuria 04/02/2015  . Nocturia 04/02/2015  . Long term current use of opiate analgesic 03/21/2015  . Encounter for therapeutic drug level monitoring 03/21/2015  . Chronic abdominal pain (epigastric) (Location of Primary Source of Pain) 03/21/2015  . Encounter for pain management planning 03/21/2015  . Chronic pain 03/21/2015  . Pain management 03/21/2015  . Blood pressure elevated 03/19/2015  . Chronic recurrent  pancreatitis (HCC) 03/19/2015  . T2DM (type 2 diabetes mellitus) (HCC) 03/19/2015  . Insomnia, persistent 10/30/2014  . History of shingles 10/25/2014    Home Medications:    Current Meds  Medication Sig  . atorvastatin (LIPITOR) 20 MG tablet Take by mouth.  . carvedilol (COREG) 6.25 MG tablet Take 6.25 mg by mouth 2 (two) times daily with a meal.  . clonazePAM (KLONOPIN) 1 MG tablet Take 1 tablet (1 mg total) by mouth 2 (two) times daily as needed for anxiety.  Mar[employer] CREON 36000 units CPEP capsule TAKE 1 CAPSULE BY MOUTH THREE TIMES DAILY WITH MEALS (Patient taking differently: Take 65,000 Units by mouth 3  (three) times daily before meals. )  . insulin aspart (NOVOLOG) 100 UNIT/ML injection Inject 2 Units 3 (three) times daily before meals into the skin. Sliding scale    Allergies:   Latex, Coconut oil, Glipizide, Metformin, Pantoprazole, and Morphine and related  Review of Systems (ROS): Review of Systems  HENT: Positive for ear pain, facial swelling and sinus pressure. Negative for congestion and sore throat.   Gastrointestinal: Negative for diarrhea and nausea.     Vital Signs: Today's Vitals   09/03/19 1549 09/03/19 1550 09/03/19 1552  BP:   122/82  Pulse:   92  Resp:   14  Temp:   98.3 F (36.8 C)  TempSrc:   Oral  SpO2:   97%  Weight:  106 lb (48.1 kg)   Height:  4\' 11"  (1.499 m)   PainSc: 9       Physical Exam: Physical Exam Vitals and nursing note reviewed.  Constitutional:      Appearance: Normal appearance.  HENT:     Nose: No congestion.     Right Turbinates: Not enlarged.     Right Sinus: Maxillary sinus tenderness present. No frontal sinus tenderness.     Left Sinus: No maxillary sinus tenderness.  Cardiovascular:     Rate and Rhythm: Normal rate and regular rhythm.     Pulses: Normal pulses.     Heart sounds: Murmur heard.  Systolic murmur is present with a grade of 3/6.   Pulmonary:     Breath sounds: Normal breath sounds.  Skin:    General: Skin is warm and dry.  Neurological:     Mental Status: She is alert.  Psychiatric:        Mood and Affect: Mood normal.        Behavior: Behavior normal.        Thought Content: Thought content normal.      Urgent Care Treatments / Results:   LABS: PLEASE NOTE: all labs that were ordered this encounter are listed, however only abnormal results are displayed. Labs Reviewed - No data to display  EKG: -None  RADIOLOGY: No results found.  PROCEDURES: Procedures  MEDICATIONS RECEIVED THIS VISIT: Medications - No data to display  PERTINENT CLINICAL COURSE NOTES/UPDATES:   Initial Impression /  Assessment and Plan / Urgent Care Course:  Pertinent labs & imaging results that were available during my care of the patient were personally reviewed by me and considered in my medical decision making (see lab/imaging section of note for values and interpretations).  Veronica Lozano is a 54 y.o. female who presents to [facility] today with complaints of facial pain, diagnosed with acute sinusitis, and treated as such with the medications below. NP and patient reviewed discharge instructions below during visit.   Patient is well appearing overall in clinic today.  She does not appear to be in any acute distress. Presenting symptoms (see HPI) and exam as documented above.   I have reviewed the follow up and strict return precautions for any new or worsening symptoms. Patient is aware of symptoms that would be deemed urgent/emergent, and would thus require further evaluation either here or in the emergency department. At the time of discharge, she verbalized understanding and consent with the discharge plan as it was reviewed with her. All questions were fielded by provider and/or clinic staff prior to patient discharge.    Final Clinical Impressions / Urgent Care Diagnoses:   Final diagnoses:  Right maxillary sinusitis    New Prescriptions:  [place] Controlled Substance Registry consulted? Not Applicable  Meds ordered this encounter  Medications  . amoxicillin-clavulanate (AUGMENTIN) 875-125 MG tablet    Sig: Take 1 tablet by mouth every 12 (twelve) hours for 10 days.    Dispense:  20 tablet    Refill:  0      Discharge Instructions     You were seen for right face pain and are being treated for right sinusitis.   Take the antibiotics as prescribed until they're finished. If you think you're having a reaction, stop the medication, take benadryl and go to the nearest urgent care/emergency room. Take a probiotic while taking the antibiotic to decrease the chances of stomach upset.    Over-the-counter Flonase can also help with the pain.  Treat your pain with Tylenol and ibuprofen as needed.  Take care, Dr. Sharlet Salina, NP-c     Recommended Follow up Care:  Patient encouraged to follow up with the following provider within the specified time frame, or sooner as dictated by the severity of her symptoms. As always, she was instructed that for any urgent/emergent care needs, she should seek care either here or in the emergency department for more immediate evaluation.   Bailey Mech, DNP, NP-c    Bailey Mech, NP 09/03/19 1701

## 2019-09-03 NOTE — Discharge Instructions (Signed)
You were seen for right face pain and are being treated for right sinusitis.   Take the antibiotics as prescribed until they're finished. If you think you're having a reaction, stop the medication, take benadryl and go to the nearest urgent care/emergency room. Take a probiotic while taking the antibiotic to decrease the chances of stomach upset.   Over-the-counter Flonase can also help with the pain.  Treat your pain with Tylenol and ibuprofen as needed.  Take care, Dr. Sharlet Salina, NP-c

## 2019-11-30 ENCOUNTER — Ambulatory Visit (INDEPENDENT_AMBULATORY_CARE_PROVIDER_SITE_OTHER): Payer: BLUE CROSS/BLUE SHIELD

## 2019-11-30 ENCOUNTER — Other Ambulatory Visit: Payer: Self-pay

## 2019-11-30 ENCOUNTER — Ambulatory Visit: Payer: BLUE CROSS/BLUE SHIELD

## 2019-11-30 ENCOUNTER — Ambulatory Visit
Admission: EM | Admit: 2019-11-30 | Discharge: 2019-11-30 | Disposition: A | Discharge status: Home / Self Care | Payer: BLUE CROSS/BLUE SHIELD | Attending: Physician Assistant | Admitting: Physician Assistant

## 2019-11-30 DIAGNOSIS — R062 Wheezing: Secondary | ICD-10-CM | POA: Diagnosis present

## 2019-11-30 DIAGNOSIS — R059 Cough, unspecified: Secondary | ICD-10-CM

## 2019-11-30 DIAGNOSIS — J209 Acute bronchitis, unspecified: Secondary | ICD-10-CM | POA: Diagnosis not present

## 2019-11-30 LAB — GLUCOSE, CAPILLARY: Glucose-Capillary: 75 mg/dL (ref 70–99)

## 2019-11-30 MED ORDER — DOXYCYCLINE HYCLATE 100 MG PO CAPS: 100.0000 mg | ORAL_CAPSULE | Freq: Two times a day (BID) | ORAL | 0 refills | Status: AC

## 2019-11-30 MED ORDER — HYDROCOD POLST-CPM POLST ER 10-8 MG/5ML PO SUER: 5.0000 mL | Freq: Two times a day (BID) | ORAL | 0 refills | Status: AC | PRN

## 2019-11-30 NOTE — ED Triage Notes (Signed)
Pt presents with HA, dry cough, sneezing x 1 day.  States she had a temp of 99.9 at home.  Is not able to take most OTC meds d/t PMH.  Had J&J vaccine in March. Pt does not want COVID test stating husband has same s/s and tested negative three days ago.

## 2019-11-30 NOTE — Discharge Instructions (Addendum)
Your chest x-ray was within normal limits today.  There is no sign of pneumonia.  Increase rest and fluids.  You can take [employer] as needed for cough.  Start the doxycycline to cover for any possible bacterial causes of your bronchitis.  You should start feeling better over the next week or 2.  If you feel worse at all--you start to run a fever, you have worsening cough, you have breathing difficulty or weakness you should follow-up with our clinic or go to ED.

## 2019-11-30 NOTE — ED Provider Notes (Signed)
[facility]    CSN: [id number] Arrival date & time: 11/30/19  1012      History   Chief Complaint Chief Complaint  Patient presents with  . Cough    HPI Veronica Lozano is a 54 y.o. female presenting for onset of fatigue, headaches, body aches and cough yesterday.  She says her husband was seen for similar symptoms a few days ago.  The patient denies any known Covid exposure.  She has been fully vaccinated for Covid with a [employer] vaccine.  Patient does not want any Covid testing since her husband test was negative.  Patient denies fever, chest pain, breathing difficulty, sinus pain, nasal congestion.  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  Patient also has diabetes and hypertension.  She has not taken any over-the-counter medication because it she says it increases her blood sugar.  Patient denies any other complaints or concerns today.  HPI  Past Medical History:  Diagnosis Date  . Diabetes mellitus without complication ([place])   . Heartburn   . HLD (hyperlipidemia)   . Hypertension   . Pancreatitis     Patient Active Problem List   Diagnosis Date Noted  . 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 Diabetes mellitus due to underlying condition with diabetic polyneuropathy, with long-term current use of insulin ([place]) 04/08/2019  . Type 2 diabetes mellitus with diabetic polyneuropathy, with long-term current use of insulin ([place]) 01/26/2019  . Smoker 10/12/2017  . Chest pain with high risk for cardiac etiology 11/05/2015  . Tobacco dependence 10/17/2015  . Hyperlipidemia, mixed 10/17/2015  . Microscopic hematuria 04/02/2015  . Nocturia 04/02/2015  . Long term current use of opiate analgesic 03/21/2015  . Encounter for therapeutic drug level monitoring 03/21/2015  . Chronic abdominal pain (epigastric) (Location of Primary Source of Pain) 03/21/2015  . Encounter  for pain management planning 03/21/2015  . Chronic pain 03/21/2015  . Pain management 03/21/2015  . Blood pressure elevated 03/19/2015  . Chronic recurrent pancreatitis ([place]) 03/19/2015  . T2DM (type 2 diabetes mellitus) ([place]) 03/19/2015  . Insomnia, persistent 10/30/2014  . History of shingles 10/25/2014    Past Surgical History:  Procedure Laterality Date  . ABDOMINAL HYSTERECTOMY    . APPENDECTOMY    . CARDIAC CATHETERIZATION    . CARPAL TUNNEL RELEASE Bilateral 1998  . CHOLECYSTECTOMY    . FOOT SURGERY Bilateral    for neuropathy    OB History   No obstetric history on file.      Home Medications    Prior to Admission medications   Medication Sig Start Date End Date Taking? Authorizing Provider  acetaminophen (TYLENOL) 325 MG tablet Take 650 mg by mouth. 12/18/16  Yes [provider]  acyclovir (ZOVIRAX) 800 MG tablet take 1 tablet by mouth once daily if needed 03/29/17  Yes Mar Daring, PA-C  atorvastatin (LIPITOR) 20 MG tablet Take by mouth. 10/15/17  Yes [provider]  Blood Glucose Monitoring Suppl (CONTOUR NEXT MONITOR) w/Device KIT use to TEST BLOOD SUGAR 12/22/16  Yes [provider]  Blood Glucose Monitoring Suppl (GLUCOCOM BLOOD GLUCOSE MONITOR) DEVI Contour Next Link Blood Glucose Meter 12/22/16  Yes [provider]  carvedilol (COREG) 6.25 MG tablet Take 6.25 mg by mouth 2 (two) times daily with a meal.   Yes [provider]  clonazePAM (KLONOPIN) 1 MG tablet Take 1 tablet (1  mg total) by mouth 2 (two) times daily as needed for anxiety. 05/05/19  Yes Mar Daring, PA-C  insulin aspart (NOVOLOG) 100 UNIT/ML injection Inject 2 Units 3 (three) times daily before meals into the skin. Sliding scale   Yes [provider]  ondansetron (ZOFRAN) 4 MG tablet Take 4 mg by mouth every 8 (eight) hours as needed for nausea or vomiting.   Yes [provider]  promethazine (PHENERGAN) 25 MG tablet  Take 25 mg by mouth every 6 (six) hours as needed for nausea or vomiting.   Yes [provider]  rifaximin (XIFAXAN) 550 MG TABS tablet Take 550 mg by mouth 3 (three) times daily. 11/19/19 12/03/19 Yes [provider]  benzonatate (TESSALON) 100 MG capsule Take 1 capsule (100 mg total) by mouth every 8 (eight) hours. 08/14/19   LampteyMyrene Galas, MD  chlorpheniramine-HYDROcodone (TUSSIONEX PENNKINETIC ER) 10-8 MG/5ML SUER Take 5 mLs by mouth every 12 (twelve) hours as needed for up to 7 days for cough. 11/30/19 12/07/19  Laurene Footman B, PA-C  CREON 36000 units CPEP capsule TAKE 1 CAPSULE BY MOUTH THREE TIMES DAILY WITH MEALS Patient taking differently: Take 65,000 Units by mouth 3 (three) times daily before meals.  11/26/18   Mar Daring, PA-C  doxycycline (VIBRAMYCIN) 100 MG capsule Take 1 capsule (100 mg total) by mouth 2 (two) times daily for 7 days. 11/30/19 12/07/19  Laurene Footman B, PA-C  budesonide-formoterol (SYMBICORT) 160-4.5 MCG/ACT inhaler Inhale 2 puffs into the lungs 2 (two) times daily. Patient not taking: Reported on 04/08/2019 07/13/18 08/14/19  Trinna Post, PA-C  insulin glargine (LANTUS) 100 UNIT/ML injection Inject 0.35 mLs (35 Units total) into the skin at bedtime. 03/19/15 08/14/19  Mar Daring, PA-C  sucralfate (CARAFATE) 1 g tablet Take by mouth. 12/01/18 08/14/19  [provider]    Family History Family History  Problem Relation Age of Onset  . Cancer Father        Lymphoma  . Kidney cancer Father   . Cancer Mother        lung  . Diabetes Brother   . Cancer Brother   . Breast cancer Sister   . Bladder Cancer Neg Hx   . Prostate cancer Neg Hx     Social History Social History   Tobacco Use  . Smoking status: Current Every Day Smoker    Packs/day: 0.25    Years: 25.00    Pack years: 6.25    Types: Cigarettes  . Smokeless tobacco: Never Used  Vaping Use  . Vaping Use: Never used  Substance Use Topics  . Alcohol  use: No    Alcohol/week: 0.0 standard drinks  . Drug use: No     Allergies   Latex, Anakinra, Coconut oil, Glipizide, Metformin, Pantoprazole, and Morphine and related   Review of Systems Review of Systems  Constitutional: Positive for fatigue. Negative for chills, diaphoresis and fever.  HENT: Positive for sneezing. Negative for congestion, ear pain, rhinorrhea, sinus pressure, sinus pain and sore throat.   Respiratory: Positive for cough and wheezing. Negative for shortness of breath.   Gastrointestinal: Negative for abdominal pain, nausea and vomiting.  Musculoskeletal: Positive for myalgias. Negative for arthralgias.  Skin: Negative for rash.  Neurological: Positive for headaches. Negative for weakness.  Hematological: Negative for adenopathy.     Physical Exam Triage Vital Signs ED Triage Vitals  Enc Vitals Group     BP 11/30/19 1059 (!) 128/99     Pulse  Rate 11/30/19 1059 98     Resp 11/30/19 1059 18     Temp 11/30/19 1059 98.6 F (37 C)     Temp Source 11/30/19 1059 Oral     SpO2 11/30/19 1059 96 %     Weight --      Height --      Head Circumference --      Peak Flow --      Pain Score 11/30/19 1100 7     Pain Loc --      Pain Edu? --      Excl. in [place]? --    No data found.  Updated Vital Signs BP (!) 128/99 (BP Location: Right Arm)   Pulse 98   Temp 98.6 F (37 C) (Oral)   Resp 18   SpO2 96%   :     Physical Exam Vitals and nursing note reviewed.  Constitutional:      General: She is not in acute distress.    Appearance: Normal appearance. She is not ill-appearing or toxic-appearing.  HENT:     Head: Normocephalic and atraumatic.     Nose: Nose normal.     Mouth/Throat:     Mouth: Mucous membranes are moist.     Pharynx: Oropharynx is clear.  Eyes:     General: No scleral icterus.       Right eye: No discharge.        Left eye: No discharge.     Conjunctiva/sclera: Conjunctivae normal.  Cardiovascular:     Rate and Rhythm: Normal rate  and regular rhythm.     Heart sounds: Normal heart sounds.  Pulmonary:     Effort: Pulmonary effort is normal. No respiratory distress.     Breath sounds: Examination of the right-middle field reveals wheezing. Examination of the left-middle field reveals wheezing. Wheezing present.  Musculoskeletal:     Cervical back: Neck supple.  Skin:    General: Skin is dry.  Neurological:     General: No focal deficit present.     Mental Status: She is alert. Mental status is at baseline.     Motor: No weakness.     Gait: Gait normal.  Psychiatric:        Mood and Affect: Mood normal.        Behavior: Behavior normal.        Thought Content: Thought content normal.      UC Treatments / Results  Labs (all labs ordered are listed, but only abnormal results are displayed) Labs Reviewed  GLUCOSE, CAPILLARY  CBG MONITORING, ED    EKG   Radiology DG Chest 2 View  Result Date: 11/30/2019 CLINICAL DATA:  Headache with dry cough and sneezing for 1 day. EXAM: CHEST - 2 VIEW COMPARISON:  Radiographs 08/23/2016.  CT 10/17/2014. FINDINGS: The heart size and mediastinal contours are normal. The lungs are clear. There is no pleural effusion or pneumothorax. No acute osseous findings are identified. IMPRESSION: Stable chest.  No active cardiopulmonary process. Electronically Signed   By: Richardean Sale M.D.   On: 11/30/2019 12:13    Procedures Procedures (including critical care time)  Medications Ordered in UC Medications - No data to display  Initial Impression / Assessment and Plan / UC Course  I have reviewed the triage vital signs and the nursing notes.  Pertinent labs & imaging results that were available during my care of the patient were reviewed by me and considered in my medical decision making (see chart  for details).   Chest x-ray obtained due to unilateral wheezing. Chest x-ray interpreted as normal. I also independently reviewed the chest x-ray. All vital signs stable. Oxygen  96%. Patient denies any associated shortness of breath or chest pain. Covid testing refused today. Advised her to consider this for any worsening symptoms. I suspect she likely has a viral infection, but due to her immunocompromise status will cover for atypical bronchitis with doxycycline at this time. Patient states she cannot take any over-the-counter medications because they raise her blood sugar too much. Patient requested the "hydrocodone cough syrup." She says this medication works well for her. She does Best boy do not help and she does have some of those at home. Advised her that I will give her a short-term supply of Tussionex at this time. Advised her to increase rest and fluid intake. Advised her to follow-up for any new or worsening symptoms including fever, chest pain, breathing difficulty or weakness. Patient agreeable.   Final Clinical Impressions(s) / UC Diagnoses   Final diagnoses:  Acute bronchitis, unspecified organism  Cough  Wheezing     Discharge Instructions     Your chest x-ray was within normal limits today.  There is no sign of pneumonia.  Increase rest and fluids.  You can take [employer] as needed for cough.  Start the doxycycline to cover for any possible bacterial causes of your bronchitis.  You should start feeling better over the next week or 2.  If you feel worse at all--you start to run a fever, you have worsening cough, you have breathing difficulty or weakness you should follow-up with our clinic or go to ED.    ED Prescriptions    Medication Sig Dispense Auth. Provider   doxycycline (VIBRAMYCIN) 100 MG capsule Take 1 capsule (100 mg total) by mouth 2 (two) times daily for 7 days. 14 capsule Laurene Footman B, PA-C   chlorpheniramine-HYDROcodone (TUSSIONEX PENNKINETIC ER) 10-8 MG/5ML SUER Take 5 mLs by mouth every 12 (twelve) hours as needed for up to 7 days for cough. 75 mL Danton Clap, PA-C     PDMP not reviewed this encounter.   Danton Clap, PA-C 11/30/19 1339

## 2020-01-21 ENCOUNTER — Other Ambulatory Visit: Payer: Self-pay

## 2020-01-21 ENCOUNTER — Encounter: Payer: Self-pay | Admitting: Emergency Medicine

## 2020-01-21 ENCOUNTER — Ambulatory Visit
Admission: EM | Admit: 2020-01-21 | Discharge: 2020-01-21 | Disposition: A | Discharge status: Home / Self Care | Payer: BLUE CROSS/BLUE SHIELD | Attending: Emergency Medicine | Admitting: Emergency Medicine

## 2020-01-21 DIAGNOSIS — R109 Unspecified abdominal pain: Secondary | ICD-10-CM | POA: Insufficient documentation

## 2020-01-21 LAB — COMPREHENSIVE METABOLIC PANEL
ALT: 19 U/L (ref 0–44)
AST: 21 U/L (ref 15–41)
Albumin: 3.8 g/dL (ref 3.5–5.0)
Alkaline Phosphatase: 66 U/L (ref 38–126)
Anion gap: 9 (ref 5–15)
BUN: 13 mg/dL (ref 6–20)
CO2: 25 mmol/L (ref 22–32)
Calcium: 8.9 mg/dL (ref 8.9–10.3)
Chloride: 103 mmol/L (ref 98–111)
Creatinine, Ser: 0.5 mg/dL (ref 0.44–1.00)
GFR, Estimated: >60 mL/min (ref >60)
Glucose, Bld: 191 mg/dL — ABNORMAL HIGH (ref 70–99)
Potassium: 4 mmol/L (ref 3.5–5.1)
Sodium: 137 mmol/L (ref 135–145)
Total Bilirubin: 0.6 mg/dL (ref 0.3–1.2)
Total Protein: 7 g/dL (ref 6.5–8.1)

## 2020-01-21 LAB — LIPASE, BLOOD: Lipase: 16 U/L (ref 11–51)

## 2020-01-21 LAB — AMYLASE: Amylase: 31 U/L (ref 28–100)

## 2020-01-21 MED ORDER — PROMETHAZINE HCL 25 MG PO TABS: 25.0000 mg | ORAL_TABLET | Freq: Four times a day (QID) | ORAL | 0 refills | Status: AC | PRN

## 2020-01-21 MED ORDER — DICYCLOMINE HCL 20 MG PO TABS
20.0000 mg | ORAL_TABLET | Freq: Two times a day (BID) | ORAL | 0 refills | Status: AC | PRN
Start: 2020-01-21 — End: ?

## 2020-01-21 NOTE — Discharge Instructions (Addendum)
You were seen for abdominal pain and are being treated for the same.   I will either give you a call for send you a MyChart message about your labs.  Take your new prescriptions as needed.  Follow-up with a gastroenterologist soon as possible.  Take care, Dr. Sharlet Salina, NP-c

## 2020-01-21 NOTE — ED Triage Notes (Signed)
Patient c/o mid abdominal pain that radiates to her mid back.  Patient states that her pancreas, gallbladder and appendix have already been taken out.  Patient reports nausea.  Patient denies V/D.  Patient denies fevers or chills.

## 2020-01-22 NOTE — ED Provider Notes (Signed)
[facility] [place] [facility] - [place], [place]   Name: Veronica Lozano DOB: 01/10/1966 MRN: [id number] CSN: [id number] PCP: Margaretann Loveless, PA-C  Arrival date and time:  01/21/20 1350  Chief Complaint:  Abdominal Pain (mid upper)   NOTE: Prior to seeing the patient today, I have reviewed the triage nursing documentation and vital signs. Clinical staff has updated patient's PMH/PSHx, current medication list, and drug allergies/intolerances to ensure comprehensive history available to assist in medical decision making.   History:   HPI: Veronica Lozano is a 54 y.o. female who presents today with complaints of severe abdominal pain.  Patient states this pain started approximately 2 weeks ago and is similar to when she had acute pancreatitis.  Pain is a stabbing pain that comes and goes and is a 10/10 at its worse.  She is attempted to treat the pain with over-the-counter Tylenol to no avail.  Nausea and bloating accompanies the pain.  No vomiting or diarrhea reported.  Of note, patient is due to surgical removal of her pancreas, gallbladder and appendix.  She denies any changes to her bowel movements or urinary function.    Past Medical History:  Diagnosis Date  . Diabetes mellitus without complication (HCC)   . Heartburn   . HLD (hyperlipidemia)   . Hypertension   . Pancreatitis     Past Surgical History:  Procedure Laterality Date  . ABDOMINAL HYSTERECTOMY    . APPENDECTOMY    . CARDIAC CATHETERIZATION    . CARPAL TUNNEL RELEASE Bilateral 1998  . CHOLECYSTECTOMY    . FOOT SURGERY Bilateral    for neuropathy    Family History  Problem Relation Age of Onset  . Cancer Father        Lymphoma  . Kidney cancer Father   . Cancer Mother        lung  . Diabetes Brother   . Cancer Brother   . Breast cancer Sister   . Bladder Cancer Neg Hx   . Prostate cancer Neg Hx     Social History   Tobacco Use  . Smoking status: Current Every Day Smoker    Packs/day: 0.25    Years:  25.00    Pack years: 6.25    Types: Cigarettes  . Smokeless tobacco: Never Used  Vaping Use  . Vaping Use: Never used  Substance Use Topics  . Alcohol use: No    Alcohol/week: 0.0 standard drinks  . Drug use: No    Patient Active Problem List   Diagnosis Date Noted  . 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 Diabetes mellitus due to underlying condition with diabetic polyneuropathy, with long-term current use of insulin (HCC) 04/08/2019  . Type 2 diabetes mellitus with diabetic polyneuropathy, with long-term current use of insulin (HCC) 01/26/2019  . Smoker 10/12/2017  . Chest pain with high risk for cardiac etiology 11/05/2015  . Tobacco dependence 10/17/2015  . Hyperlipidemia, mixed 10/17/2015  . Microscopic hematuria 04/02/2015  . Nocturia 04/02/2015  . Long term current use of opiate analgesic 03/21/2015  . Encounter for therapeutic drug level monitoring 03/21/2015  . Chronic abdominal pain (epigastric) (Location of Primary Source of Pain) 03/21/2015  . Encounter for pain management planning 03/21/2015  . Chronic pain 03/21/2015  . Pain management 03/21/2015  . Blood pressure elevated 03/19/2015  . Chronic recurrent pancreatitis (HCC) 03/19/2015  . T2DM (type 2 diabetes mellitus) (HCC) 03/19/2015  . Insomnia, persistent 10/30/2014  . History of shingles  10/25/2014    Home Medications:    Current Meds  Medication Sig  . atorvastatin (LIPITOR) 20 MG tablet Take by mouth.  . carvedilol (COREG) 6.25 MG tablet Take 6.25 mg by mouth 2 (two) times daily with a meal.  . clonazePAM (KLONOPIN) 1 MG tablet Take 1 tablet (1 mg total) by mouth 2 (two) times daily as needed for anxiety.  Marland Kitchen CREON 36000 units CPEP capsule TAKE 1 CAPSULE BY MOUTH THREE TIMES DAILY WITH MEALS (Patient taking differently: Take 65,000 Units by mouth 3 (three) times daily before meals. )  . insulin aspart (NOVOLOG) 100 UNIT/ML injection Inject 2 Units 3 (three) times daily  before meals into the skin. Sliding scale    Allergies:   Latex, Anakinra, Coconut oil, Glipizide, Metformin, Pantoprazole, and Morphine and related  Review of Systems (ROS): Review of Systems  Gastrointestinal: Positive for abdominal distention, abdominal pain and nausea. Negative for constipation and diarrhea.  Genitourinary: Negative for difficulty urinating, dyspareunia and flank pain.  Musculoskeletal: Positive for back pain.  All other systems reviewed and are negative.    Vital Signs: Today's Vitals   01/21/20 1418 01/21/20 1422 01/21/20 1532  BP:  (!) 164/92   Pulse:  89   Resp:  16   Temp:  98.4 F (36.9 C)   TempSrc:  Oral   SpO2:  98%   Weight: 112 lb (50.8 kg)    Height: 4\' 11"  (1.499 m)    PainSc: 10-Worst pain ever  10-Worst pain ever    Physical Exam: Physical Exam Vitals and nursing note reviewed.  Constitutional:      Appearance: She is well-developed.  HENT:     Mouth/Throat:     Mouth: Mucous membranes are moist.  Cardiovascular:     Rate and Rhythm: Normal rate and regular rhythm.     Heart sounds: Normal heart sounds.  Pulmonary:     Effort: Pulmonary effort is normal.     Breath sounds: Normal breath sounds.  Abdominal:     General: Abdomen is flat. Bowel sounds are normal.     Palpations: Abdomen is soft.     Tenderness: There is generalized abdominal tenderness.  Skin:    Capillary Refill: Capillary refill takes less than 2 seconds.  Neurological:     Mental Status: She is alert.      Urgent Care Treatments / Results:   LABS: PLEASE NOTE: all labs that were ordered this encounter are listed, however only abnormal results are displayed. Labs Reviewed  COMPREHENSIVE METABOLIC PANEL - Abnormal; Notable for the following components:      Result Value   Glucose, Bld 191 (*)    All other components within normal limits  LIPASE, BLOOD  AMYLASE    EKG: -None  RADIOLOGY: No results found.  PROCEDURES: Procedures  MEDICATIONS  RECEIVED THIS VISIT: Medications - No data to display  PERTINENT CLINICAL COURSE NOTES/UPDATES:   Initial Impression / Assessment and Plan / Urgent Care Course:  Pertinent labs & imaging results that were available during my care of the patient were personally reviewed by me and considered in my medical decision making (see lab/imaging section of note for values and interpretations).  Veronica Lozano is a 54 y.o. female who presents to [facility] today with complaints of abdominal pain, diagnosed with abdominal spasms, and treated as such with the medications below. NP and patient reviewed discharge instructions below during visit.   Patient is well appearing overall in clinic today. She does  not appear to be in any acute distress. Presenting symptoms (see HPI) and exam as documented above.   I have reviewed the follow up and strict return precautions for any new or worsening symptoms. Patient is aware of symptoms that would be deemed urgent/emergent, and would thus require further evaluation either here or in the emergency department. At the time of discharge, she verbalized understanding and consent with the discharge plan as it was reviewed with her. All questions were fielded by provider and/or clinic staff prior to patient discharge.    Final Clinical Impressions / Urgent Care Diagnoses:   Final diagnoses:  Abdominal spasms    New Prescriptions:  [place] Controlled Substance Registry consulted? Not Applicable  Meds ordered this encounter  Medications  . promethazine (PHENERGAN) 25 MG tablet    Sig: Take 1 tablet (25 mg total) by mouth every 6 (six) hours as needed for nausea or vomiting.    Dispense:  30 tablet    Refill:  0  . dicyclomine (BENTYL) 20 MG tablet    Sig: Take 1 tablet (20 mg total) by mouth 2 (two) times daily as needed for spasms.    Dispense:  20 tablet    Refill:  0      Discharge Instructions     You were seen for abdominal pain and are being treated  for the same.   I will either give you a call for send you a MyChart message about your labs.  Take your new prescriptions as needed.  Follow-up with a gastroenterologist soon as possible.  Take care, Dr. Sharlet [place], NP-c     Recommended Follow up Care:  Patient encouraged to follow up with the following provider within the specified time frame, or sooner as dictated by the severity of her symptoms. As always, she was instructed that for any urgent/emergent care needs, she should seek care either here or in the emergency department for more immediate evaluation.   Bailey Mech, DNP, NP-c    Bailey Mech, NP 01/22/20 [phone]

## 2020-02-16 ENCOUNTER — Ambulatory Visit
Admission: EM | Admit: 2020-02-16 | Discharge: 2020-02-16 | Disposition: A | Discharge status: Home / Self Care | Payer: BLUE CROSS/BLUE SHIELD | Attending: Family Medicine | Admitting: Family Medicine

## 2020-02-16 ENCOUNTER — Other Ambulatory Visit: Payer: Self-pay

## 2020-02-16 DIAGNOSIS — R079 Chest pain, unspecified: Secondary | ICD-10-CM | POA: Diagnosis not present

## 2020-02-16 NOTE — ED Triage Notes (Signed)
Patient complains of center of chest pain that radiates to left arm and states that it has been making her mouth water. States that she took some aspirin at work, reports that it did not help. States that this started around 330am when she woke up.

## 2020-02-16 NOTE — ED Provider Notes (Signed)
[facility]    CSN: [id number] Arrival date & time: 02/16/20  1014      History   Chief Complaint Chief Complaint  Patient presents with  . Chest Pain   HPI  54 year old female with a complicated past medical history including diabetes, hyperlipidemia, tobacco abuse presents with chest pain.  Started earlier this morning around 3-330.  She states that it occurred when she woke up.  Located centrally.  Radiates to her left arm.  She states that her mouth is watering.  She states that her pain is severe, 10/10 in severity.  No relieving factors.  She has taken aspirin 81 mg without resolution.  No relieving factors.  She reports associated shortness of breath.  No other associated symptoms.  No other complaints.  Past Medical History:  Diagnosis Date  . Diabetes mellitus without complication ([place])   . Heartburn   . HLD (hyperlipidemia)   . Hypertension   . Pancreatitis     Patient Active Problem List   Diagnosis Date Noted  . 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 Diabetes mellitus due to underlying condition with diabetic polyneuropathy, with long-term current use of insulin ([place]) 04/08/2019  . Type 2 diabetes mellitus with diabetic polyneuropathy, with long-term current use of insulin ([place]) 01/26/2019  . Smoker 10/12/2017  . Chest pain with high risk for cardiac etiology 11/05/2015  . Tobacco dependence 10/17/2015  . Hyperlipidemia, mixed 10/17/2015  . Microscopic hematuria 04/02/2015  . Nocturia 04/02/2015  . Long term current use of opiate analgesic 03/21/2015  . Encounter for therapeutic drug level monitoring 03/21/2015  . Chronic abdominal pain (epigastric) (Location of Primary Source of Pain) 03/21/2015  . Encounter for pain management planning 03/21/2015  . Chronic pain 03/21/2015  . Pain management 03/21/2015  . Blood pressure elevated 03/19/2015  . Chronic recurrent pancreatitis ([place]) 03/19/2015  . T2DM (type 2  diabetes mellitus) ([place]) 03/19/2015  . Insomnia, persistent 10/30/2014  . History of shingles 10/25/2014    Past Surgical History:  Procedure Laterality Date  . ABDOMINAL HYSTERECTOMY    . APPENDECTOMY    . CARDIAC CATHETERIZATION    . CARPAL TUNNEL RELEASE Bilateral 1998  . CHOLECYSTECTOMY    . FOOT SURGERY Bilateral    for neuropathy    OB History   No obstetric history on file.      Home Medications    Prior to Admission medications   Medication Sig Start Date End Date Taking? Authorizing Provider  acetaminophen (TYLENOL) 325 MG tablet Take 650 mg by mouth. 12/18/16  Yes [provider]  aspirin 81 MG EC tablet Take by mouth. 04/01/19 03/31/20 Yes [provider]  atorvastatin (LIPITOR) 20 MG tablet Take by mouth. 10/15/17  Yes [provider]  Blood Glucose Monitoring Suppl (CONTOUR NEXT MONITOR) w/Device KIT use to TEST BLOOD SUGAR 12/22/16  Yes [provider]  Blood Glucose Monitoring Suppl (GLUCOCOM BLOOD GLUCOSE MONITOR) DEVI Contour Next Link Blood Glucose Meter 12/22/16  Yes [provider]  carvedilol (COREG) 6.25 MG tablet Take 6.25 mg by mouth 2 (two) times daily with a meal.   Yes [provider]  clonazePAM (KLONOPIN) 1 MG tablet Take 1 tablet (1 mg total) by mouth 2 (two) times daily as needed for anxiety. 05/05/19  Yes Mar Daring, PA-C  Continuous Blood Gluc Sensor (DEXCOM G6 SENSOR) MISC SMARTSIG:1 Topical Every 10 Days 01/13/20  Yes [provider]  CREON 36000 units CPEP capsule TAKE 1  CAPSULE BY MOUTH THREE TIMES DAILY WITH MEALS Patient taking differently: Take 65,000 Units by mouth 3 (three) times daily before meals. 11/26/18  Yes Mar Daring, PA-C  dicyclomine (BENTYL) 20 MG tablet Take 1 tablet (20 mg total) by mouth 2 (two) times daily as needed for spasms. 01/21/20  Yes Gertie Baron, NP  insulin aspart (NOVOLOG) 100 UNIT/ML injection Inject 2 Units 3 (three) times daily  before meals into the skin. Sliding scale   Yes [provider]  promethazine (PHENERGAN) 25 MG tablet Take 1 tablet (25 mg total) by mouth every 6 (six) hours as needed for nausea or vomiting. 01/21/20  Yes Gertie Baron, NP  vancomycin (VANCOCIN) 125 MG capsule Take 125 mg by mouth 4 (four) times daily. 02/07/20  Yes [provider]  acyclovir (ZOVIRAX) 800 MG tablet take 1 tablet by mouth once daily if needed 03/29/17   Mar Daring, PA-C  benzonatate (TESSALON) 100 MG capsule Take 1 capsule (100 mg total) by mouth every 8 (eight) hours. 08/14/19   Lamptey, Myrene Galas, MD  ondansetron (ZOFRAN) 4 MG tablet Take 4 mg by mouth every 8 (eight) hours as needed for nausea or vomiting.    [provider]  budesonide-formoterol (SYMBICORT) 160-4.5 MCG/ACT inhaler Inhale 2 puffs into the lungs 2 (two) times daily. Patient not taking: Reported on 04/08/2019 07/13/18 08/14/19  Trinna Post, PA-C  insulin glargine (LANTUS) 100 UNIT/ML injection Inject 0.35 mLs (35 Units total) into the skin at bedtime. 03/19/15 08/14/19  Mar Daring, PA-C  sucralfate (CARAFATE) 1 g tablet Take by mouth. 12/01/18 08/14/19  [provider]    Family History Family History  Problem Relation Age of Onset  . Cancer Father        Lymphoma  . Kidney cancer Father   . Cancer Mother        lung  . Diabetes Brother   . Cancer Brother   . Breast cancer Sister   . Bladder Cancer Neg Hx   . Prostate cancer Neg Hx     Social History Social History   Tobacco Use  . Smoking status: Current Every Day Smoker    Packs/day: 0.50    Years: 25.00    Pack years: 12.50    Types: Cigarettes  . Smokeless tobacco: Never Used  Vaping Use  . Vaping Use: Never used  Substance Use Topics  . Alcohol use: No    Alcohol/week: 0.0 standard drinks  . Drug use: No     Allergies   Latex, Anakinra, Coconut oil, Glipizide, Metformin, Pantoprazole, and Morphine and related   Review of  Systems Review of Systems  Respiratory: Positive for shortness of breath.   Cardiovascular: Positive for chest pain.   Physical Exam Triage Vital Signs ED Triage Vitals  Enc Vitals Group     BP 02/16/20 1031 (!) 144/80     Pulse Rate 02/16/20 1031 90     Resp 02/16/20 1031 18     Temp 02/16/20 1031 98.2 F (36.8 C)     Temp Source 02/16/20 1031 Oral     SpO2 02/16/20 1031 98 %     Weight 02/16/20 1027 115 lb (52.2 kg)     Height 02/16/20 1027 _0  (1.499 m)     Head Circumference --      Peak Flow --      Pain Score 02/16/20 1027 10     Pain Loc --      Pain Edu? --  Excl. in [place]? --    Updated Vital Signs BP (!) 144/80 (BP Location: Left Arm)   Pulse 90   Temp 98.2 F (36.8 C) (Oral)   Resp 18   Ht _0  (1.499 m)   Wt 52.2 kg   SpO2 98%   BMI 23.23 kg/m   Visual Acuity Right Eye Distance:   Left Eye Distance:   Bilateral Distance:    Right Eye Near:   Left Eye Near:    Bilateral Near:     Physical Exam Vitals and nursing note reviewed.  Constitutional:      General: She is not in acute distress.    Appearance: Normal appearance. She is well-developed.  HENT:     Head: Normocephalic and atraumatic.  Eyes:     General:        Right eye: No discharge.        Left eye: No discharge.     Conjunctiva/sclera: Conjunctivae normal.  Cardiovascular:     Rate and Rhythm: Normal rate and regular rhythm.     Heart sounds: No murmur heard.   Pulmonary:     Effort: Pulmonary effort is normal.     Breath sounds: Normal breath sounds. No wheezing, rhonchi or rales.  Chest:     Chest wall: Tenderness present.  Abdominal:     General: There is no distension.     Palpations: Abdomen is soft.     Tenderness: There is no abdominal tenderness.  Neurological:     Mental Status: She is alert.  Psychiatric:        Mood and Affect: Mood normal.        Behavior: Behavior normal.    UC Treatments / Results  Labs (all labs ordered are listed, but only  abnormal results are displayed) Labs Reviewed - No data to display  EKG Interpretation: Normal sinus rhythm with rate of 81.  Normal axis.  Normal intervals.  No ST or T wave changes.   Radiology No results found.  Procedures Procedures (including critical care time)  Medications Ordered in UC Medications - No data to display  Initial Impression / Assessment and Plan / UC Course  I have reviewed the triage vital signs and the nursing notes.  Pertinent labs & imaging results that were available during my care of the patient were reviewed by me and considered in my medical decision making (see chart for details).    54 year old female presents with acute chest pain.  Patient has multiple risk factors.  Patient is going directly to the hospital for further evaluation and management.  She refuses EMS transport.  She states that she will be going [facility].  Her husband will be driving her.  Final Clinical Impressions(s) / UC Diagnoses   Final diagnoses:  Chest pain, unspecified type     Discharge Instructions     You need to go directly to the ER.  You need serial cardiac enzymes.  Best of luck  Take care  Dr. Lacinda Axon    ED Prescriptions    None     PDMP not reviewed this encounter.   Coral Spikes, [place] 02/16/20 1052

## 2020-02-16 NOTE — Discharge Instructions (Signed)
You need to go directly to the ER.  You need serial cardiac enzymes.  Best of luck  Take care  Dr. Adriana Simas
